# Patient Record
Sex: Female | Born: 1940 | Race: Black or African American | Hispanic: No | Smoking: Never smoker
Health system: Southern US, Community
[De-identification: ages and names within clinical notes are randomized; demographics above are authoritative.]

## PROBLEM LIST (undated history)

## (undated) DIAGNOSIS — M1990 Unspecified osteoarthritis, unspecified site: Secondary | ICD-10-CM

## (undated) DIAGNOSIS — M549 Dorsalgia, unspecified: Secondary | ICD-10-CM

## (undated) DIAGNOSIS — I10 Essential (primary) hypertension: Secondary | ICD-10-CM

## (undated) DIAGNOSIS — G8929 Other chronic pain: Secondary | ICD-10-CM

## (undated) DIAGNOSIS — F329 Major depressive disorder, single episode, unspecified: Secondary | ICD-10-CM

## (undated) DIAGNOSIS — Z8489 Family history of other specified conditions: Secondary | ICD-10-CM

## (undated) DIAGNOSIS — H409 Unspecified glaucoma: Secondary | ICD-10-CM

## (undated) DIAGNOSIS — Z8719 Personal history of other diseases of the digestive system: Secondary | ICD-10-CM

## (undated) DIAGNOSIS — J45909 Unspecified asthma, uncomplicated: Secondary | ICD-10-CM

## (undated) DIAGNOSIS — E119 Type 2 diabetes mellitus without complications: Secondary | ICD-10-CM

## (undated) DIAGNOSIS — K219 Gastro-esophageal reflux disease without esophagitis: Secondary | ICD-10-CM

## (undated) DIAGNOSIS — R519 Headache, unspecified: Secondary | ICD-10-CM

## (undated) DIAGNOSIS — D649 Anemia, unspecified: Secondary | ICD-10-CM

## (undated) DIAGNOSIS — R51 Headache: Secondary | ICD-10-CM

## (undated) DIAGNOSIS — C801 Malignant (primary) neoplasm, unspecified: Secondary | ICD-10-CM

## (undated) DIAGNOSIS — M810 Age-related osteoporosis without current pathological fracture: Secondary | ICD-10-CM

## (undated) DIAGNOSIS — R011 Cardiac murmur, unspecified: Secondary | ICD-10-CM

## (undated) DIAGNOSIS — N289 Disorder of kidney and ureter, unspecified: Secondary | ICD-10-CM

## (undated) DIAGNOSIS — G4710 Hypersomnia, unspecified: Secondary | ICD-10-CM

## (undated) DIAGNOSIS — G4730 Sleep apnea, unspecified: Secondary | ICD-10-CM

## (undated) DIAGNOSIS — N390 Urinary tract infection, site not specified: Secondary | ICD-10-CM

## (undated) DIAGNOSIS — K279 Peptic ulcer, site unspecified, unspecified as acute or chronic, without hemorrhage or perforation: Secondary | ICD-10-CM

## (undated) DIAGNOSIS — E7800 Pure hypercholesterolemia, unspecified: Secondary | ICD-10-CM

## (undated) DIAGNOSIS — F32A Depression, unspecified: Secondary | ICD-10-CM

## (undated) HISTORY — PX: JOINT REPLACEMENT: SHX530

## (undated) HISTORY — PX: TOTAL KNEE ARTHROPLASTY: SHX125

## (undated) HISTORY — PX: PACEMAKER IMPLANT: EP1218

## (undated) HISTORY — PX: CHOLECYSTECTOMY: SHX55

## (undated) HISTORY — PX: OTHER SURGICAL HISTORY: SHX169

## (undated) HISTORY — PX: KNEE ARTHROCENTESIS: SUR44

## (undated) HISTORY — PX: NEPHRECTOMY: SHX65

---

## 1998-02-01 ENCOUNTER — Ambulatory Visit (HOSPITAL_COMMUNITY): Admission: RE | Admit: 1998-02-01 | Discharge: 1998-02-01 | Payer: Self-pay | Admitting: *Deleted

## 1998-02-04 ENCOUNTER — Ambulatory Visit (HOSPITAL_COMMUNITY): Admission: RE | Admit: 1998-02-04 | Discharge: 1998-02-04 | Payer: Self-pay | Admitting: *Deleted

## 1999-07-26 ENCOUNTER — Encounter: Admission: RE | Admit: 1999-07-26 | Discharge: 1999-07-26 | Payer: Self-pay | Admitting: Urology

## 1999-07-26 ENCOUNTER — Encounter: Payer: Self-pay | Admitting: Urology

## 2001-01-13 ENCOUNTER — Emergency Department (HOSPITAL_COMMUNITY): Admission: EM | Admit: 2001-01-13 | Discharge: 2001-01-13 | Payer: Self-pay | Admitting: Emergency Medicine

## 2001-01-13 ENCOUNTER — Encounter: Payer: Self-pay | Admitting: Emergency Medicine

## 2004-02-08 ENCOUNTER — Ambulatory Visit (HOSPITAL_COMMUNITY): Admission: RE | Admit: 2004-02-08 | Discharge: 2004-02-08 | Payer: Self-pay | Admitting: Urology

## 2004-02-22 ENCOUNTER — Ambulatory Visit (HOSPITAL_COMMUNITY): Admission: RE | Admit: 2004-02-22 | Discharge: 2004-02-22 | Payer: Self-pay | Admitting: Urology

## 2004-02-22 ENCOUNTER — Ambulatory Visit (HOSPITAL_BASED_OUTPATIENT_CLINIC_OR_DEPARTMENT_OTHER): Admission: RE | Admit: 2004-02-22 | Discharge: 2004-02-22 | Payer: Self-pay | Admitting: Urology

## 2004-04-10 ENCOUNTER — Encounter: Admission: RE | Admit: 2004-04-10 | Discharge: 2004-04-10 | Payer: Self-pay | Admitting: General Surgery

## 2004-05-25 ENCOUNTER — Encounter (INDEPENDENT_AMBULATORY_CARE_PROVIDER_SITE_OTHER): Payer: Self-pay | Admitting: *Deleted

## 2004-05-25 ENCOUNTER — Inpatient Hospital Stay (HOSPITAL_COMMUNITY): Admission: RE | Admit: 2004-05-25 | Discharge: 2004-05-28 | Payer: Self-pay | Admitting: General Surgery

## 2007-07-02 ENCOUNTER — Ambulatory Visit (HOSPITAL_COMMUNITY): Admission: RE | Admit: 2007-07-02 | Discharge: 2007-07-02 | Payer: Self-pay | Admitting: Urology

## 2009-04-02 ENCOUNTER — Ambulatory Visit: Payer: Self-pay | Admitting: Diagnostic Radiology

## 2009-04-02 ENCOUNTER — Emergency Department (HOSPITAL_BASED_OUTPATIENT_CLINIC_OR_DEPARTMENT_OTHER): Admission: EM | Admit: 2009-04-02 | Discharge: 2009-04-02 | Payer: Self-pay | Admitting: Emergency Medicine

## 2009-04-25 ENCOUNTER — Emergency Department (HOSPITAL_BASED_OUTPATIENT_CLINIC_OR_DEPARTMENT_OTHER): Admission: EM | Admit: 2009-04-25 | Discharge: 2009-04-26 | Payer: Self-pay | Admitting: Emergency Medicine

## 2009-04-26 ENCOUNTER — Ambulatory Visit: Payer: Self-pay | Admitting: Diagnostic Radiology

## 2009-11-01 ENCOUNTER — Ambulatory Visit (HOSPITAL_COMMUNITY)
Admission: RE | Admit: 2009-11-01 | Discharge: 2009-11-01 | Payer: Self-pay | Source: Home / Self Care | Admitting: Urology

## 2010-03-07 IMAGING — CR DG KNEE COMPLETE 4+V*R*
4 series · 4 of 4 positions shown · non-contrast
Comparison: None.

CLINICAL DATA: 68-year-old female status post fall with knee pain.

RIGHT KNEE - COMPLETE 4+ VIEW

[t knee ap right]
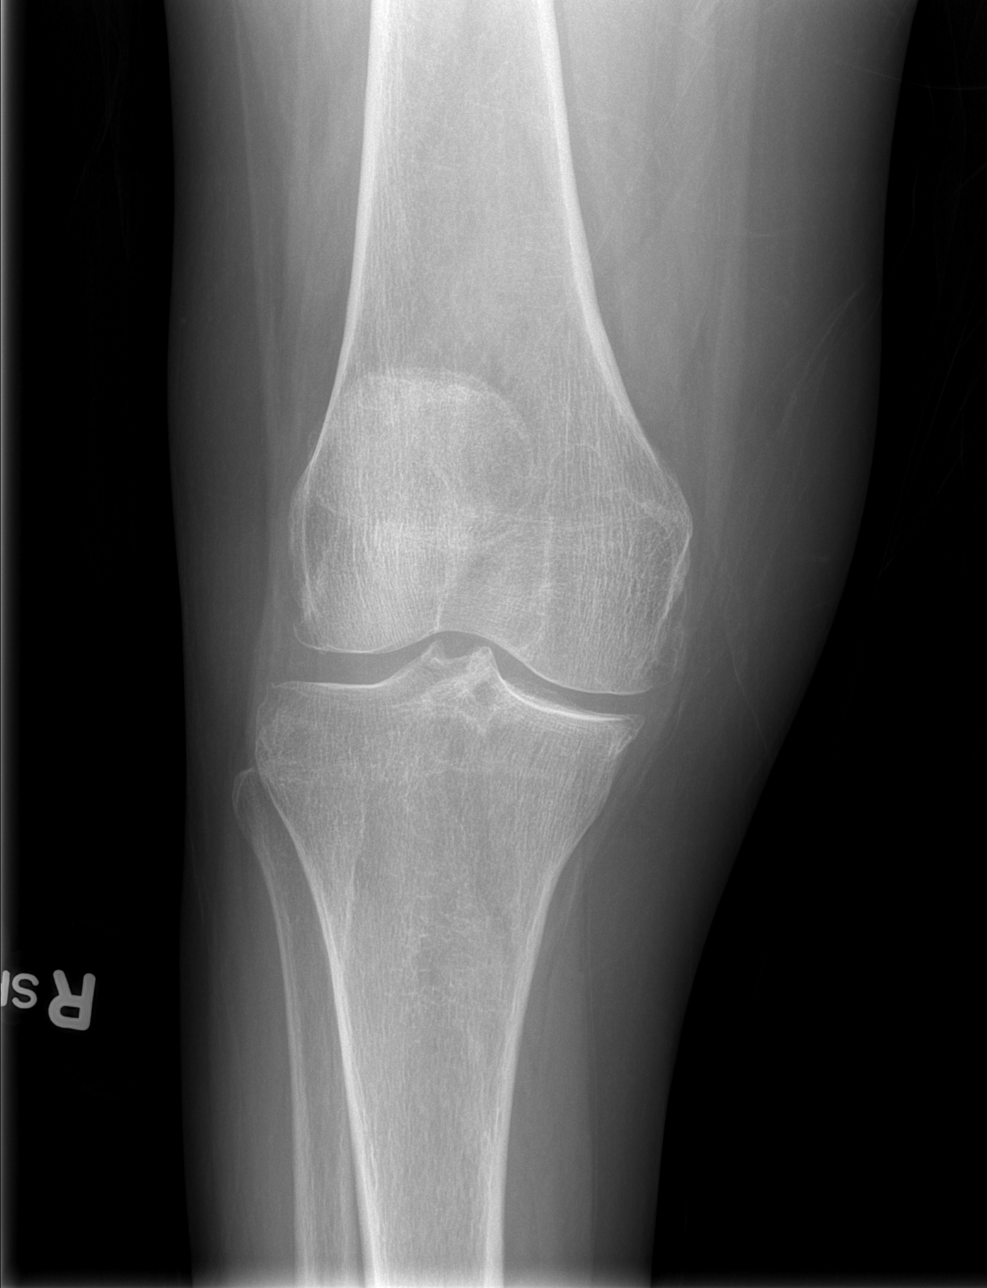

[t knee oblique right (1 of 2)]
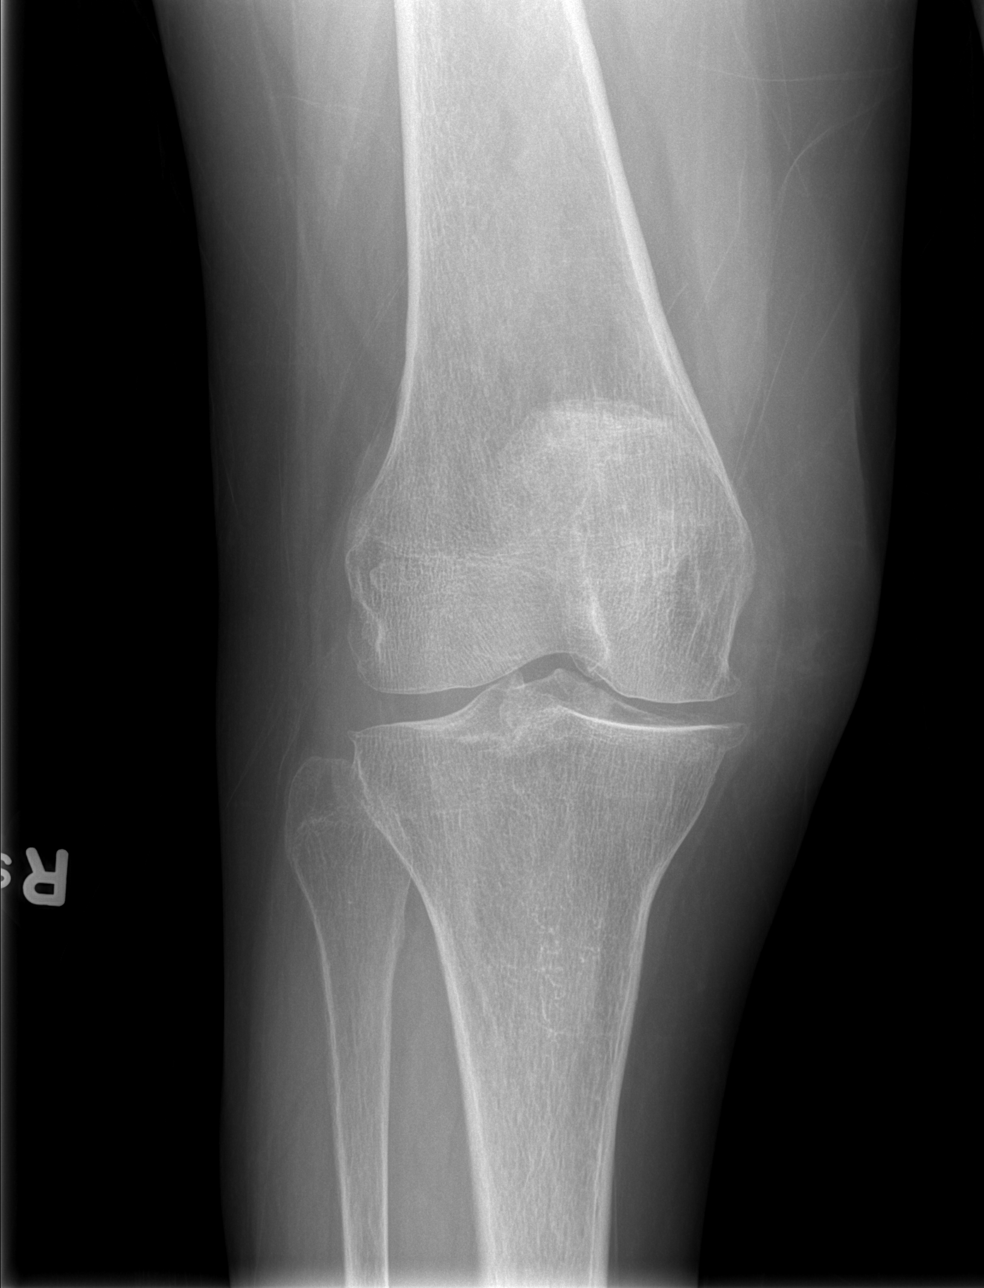

[t knee oblique right (2 of 2)]
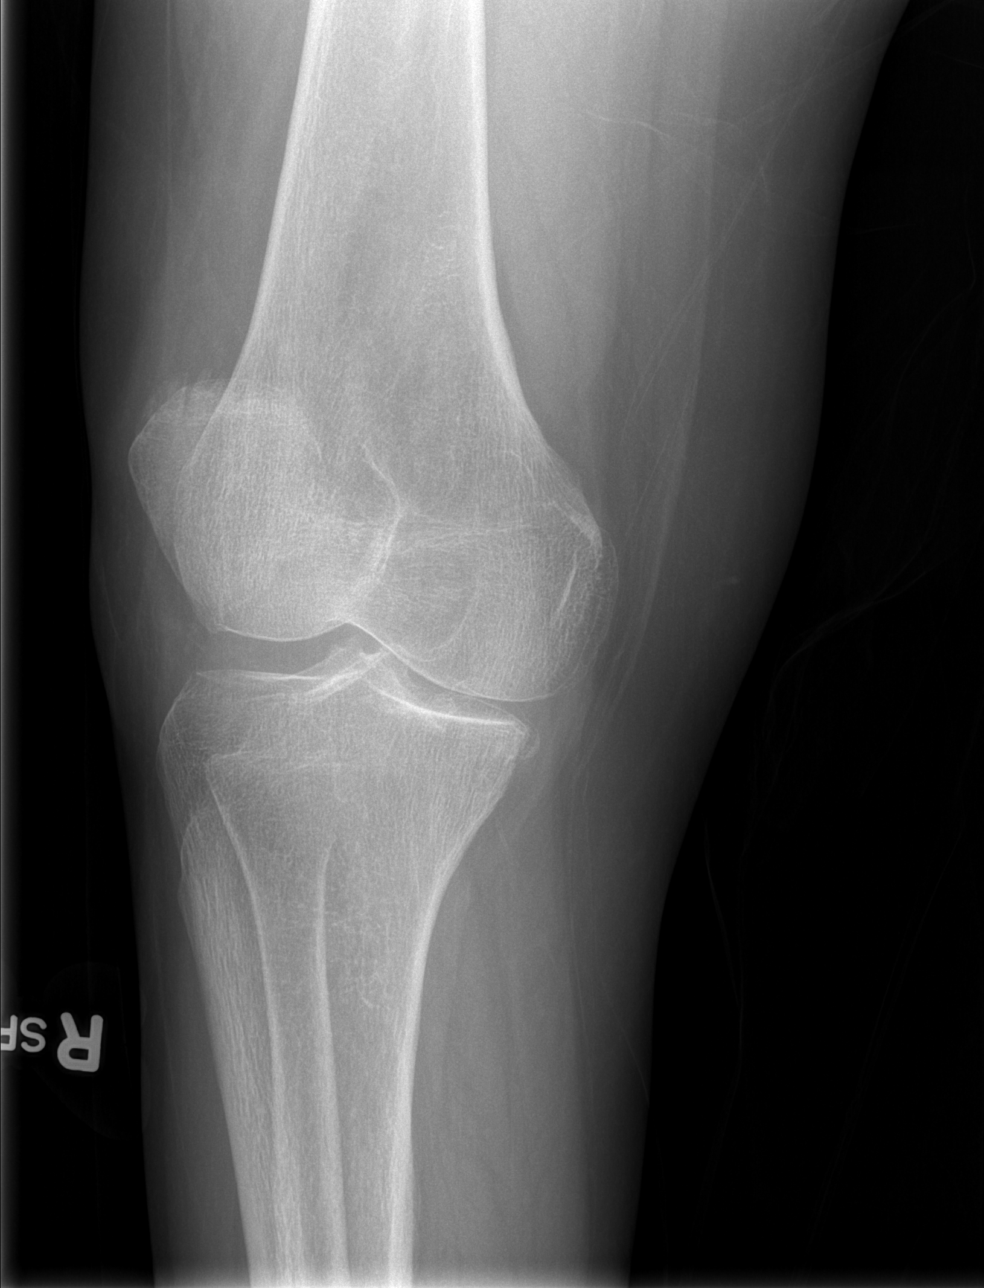

[t knee lat right]
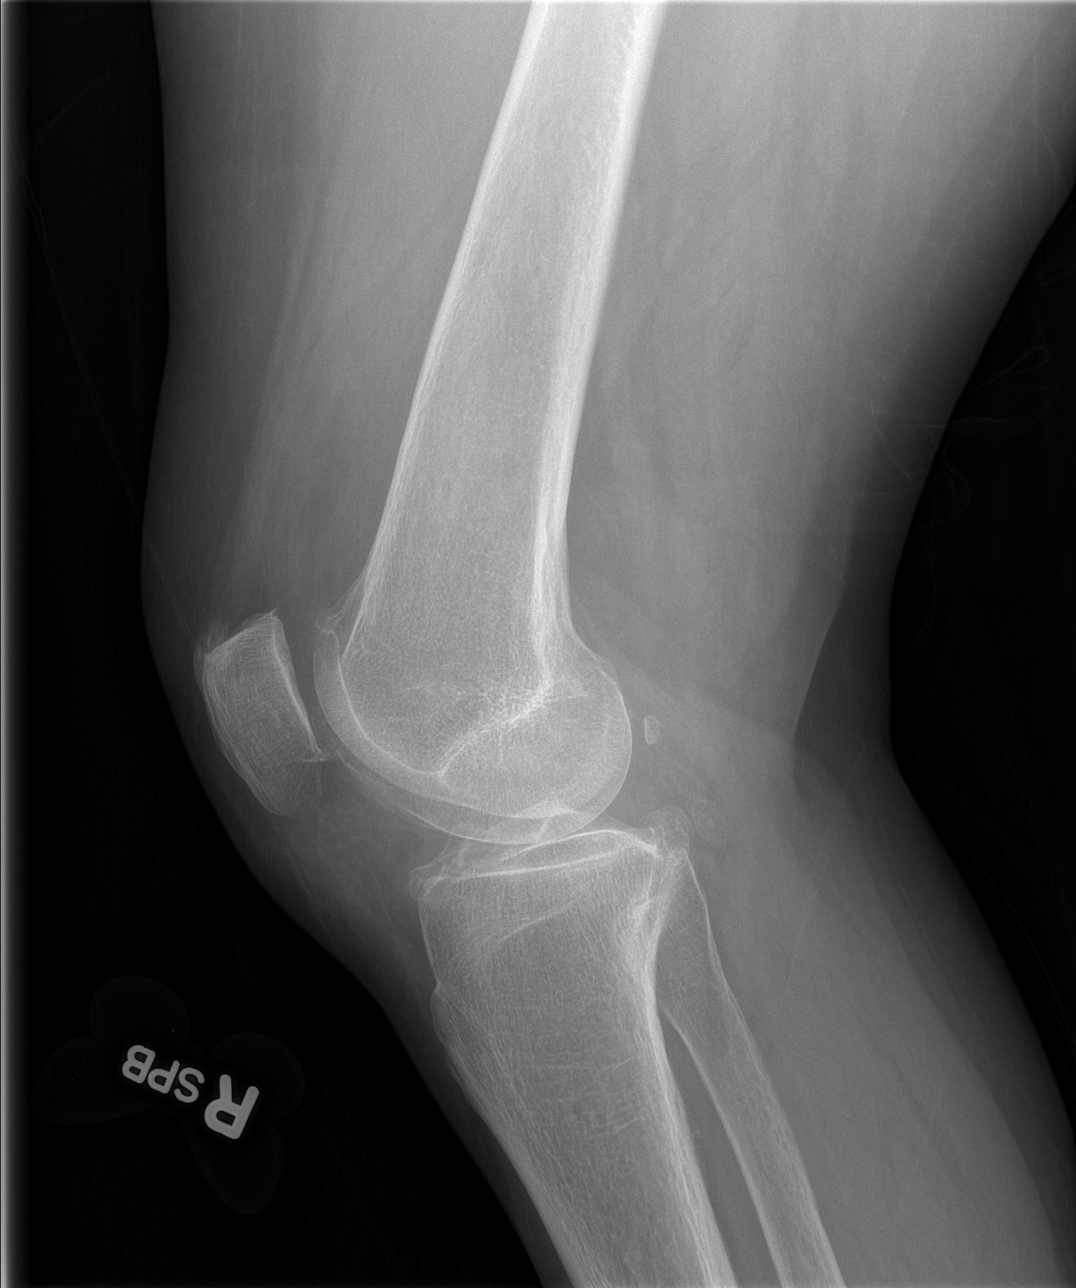

[4 of 4 positions shown; findings below may reference images not displayed]

FINDINGS: Osteopenia.  Degenerative spurring medial compartment.
Possible small joint effusion.  There is stranding in Hoffa's fat
pad.  Corticated fragment posterior to the knee.  No acute fracture
identified.
IMPRESSION: Possible small joint effusion and stranding in Hoffa's fat pad may
indicate internal derangement. No definite acute fracture or
dislocation identified about the right knee.

## 2010-03-07 IMAGING — CR DG FINGER LITTLE 2+V*L*
3 series · 3 of 3 positions shown · non-contrast
Comparison: 4466 hours the same day.

CLINICAL DATA: 68-year-old female status post reduction of left
fifth digit fracture.

LEFT LITTLE FINGER 2+V

[x finger pa left *]
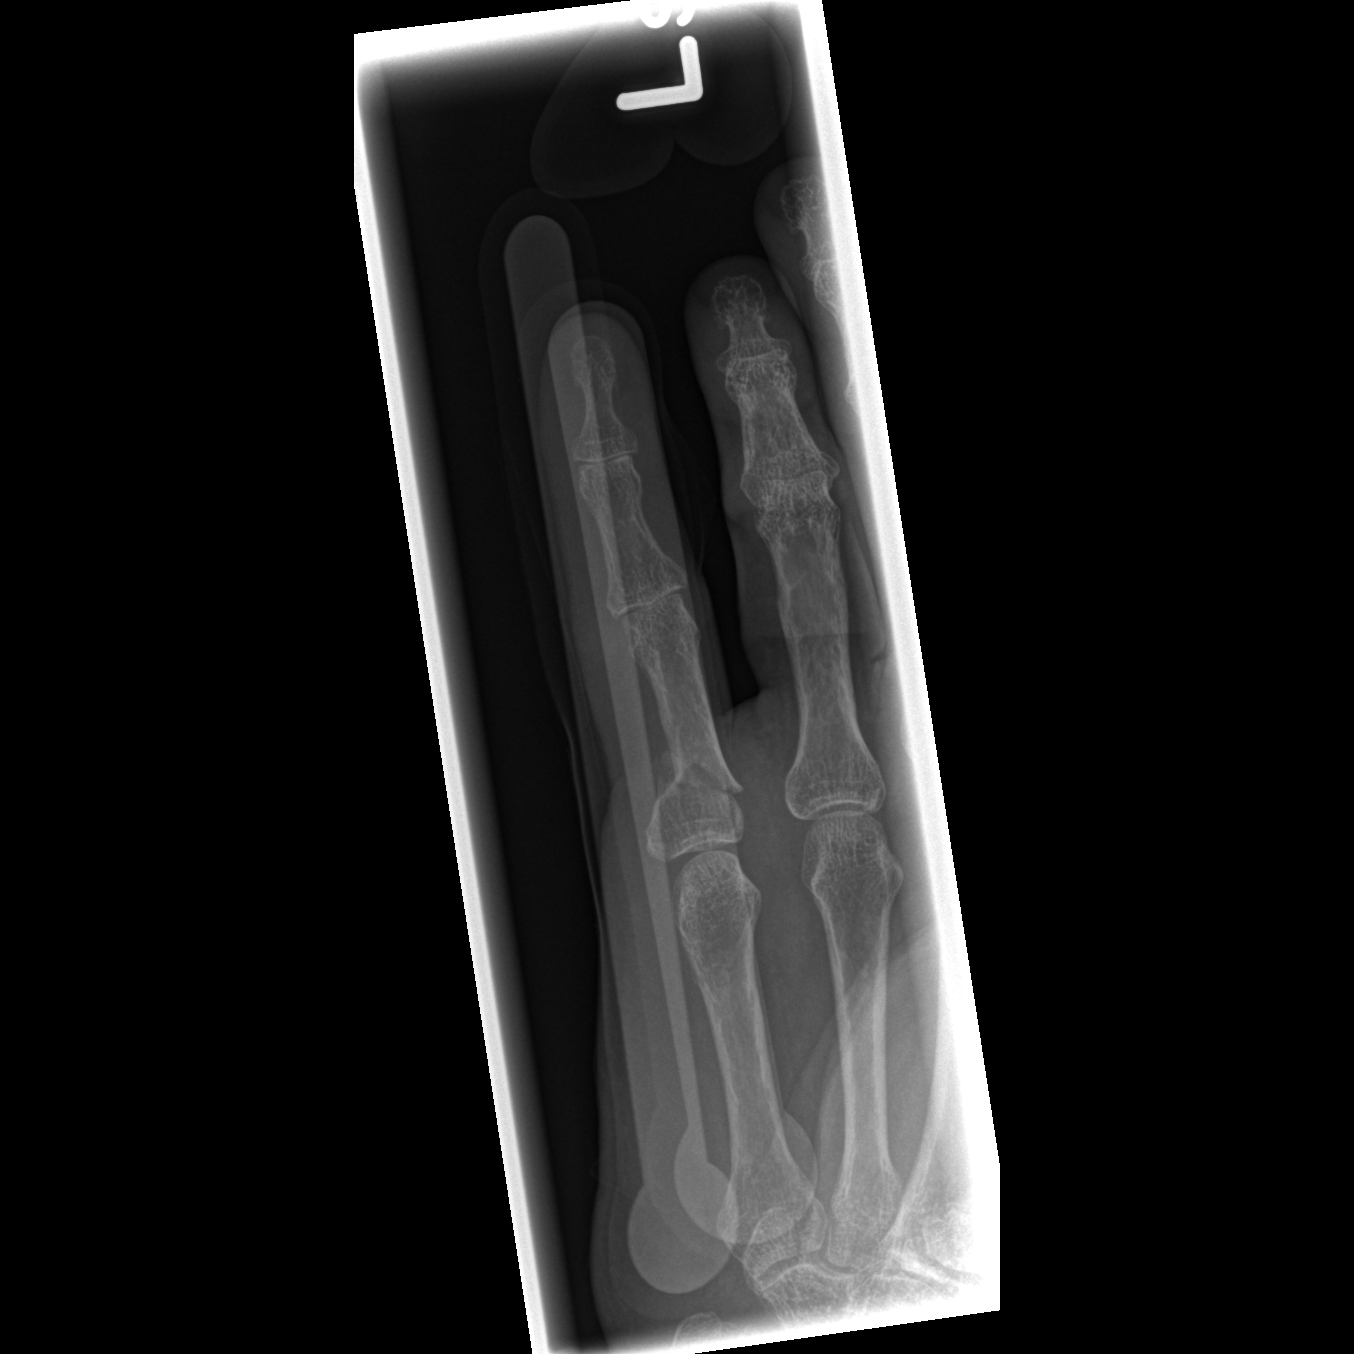

[x finger obl. left *]
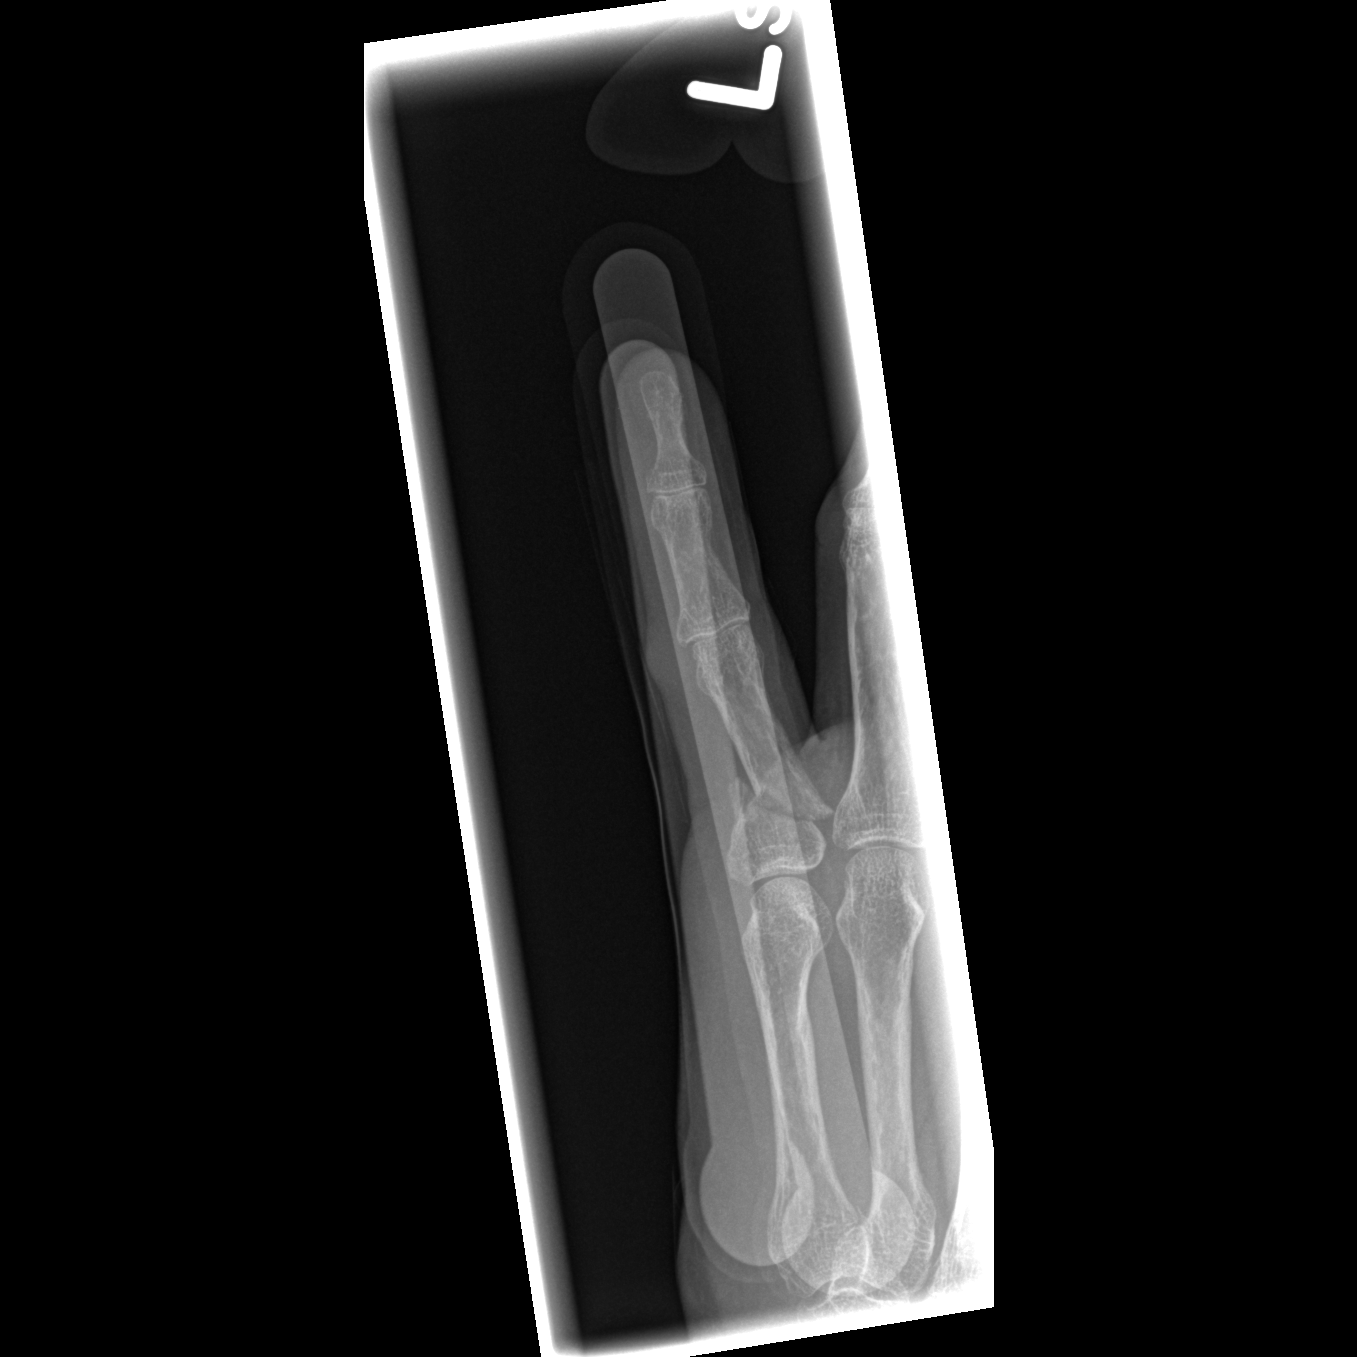

[x finger lateral left *]
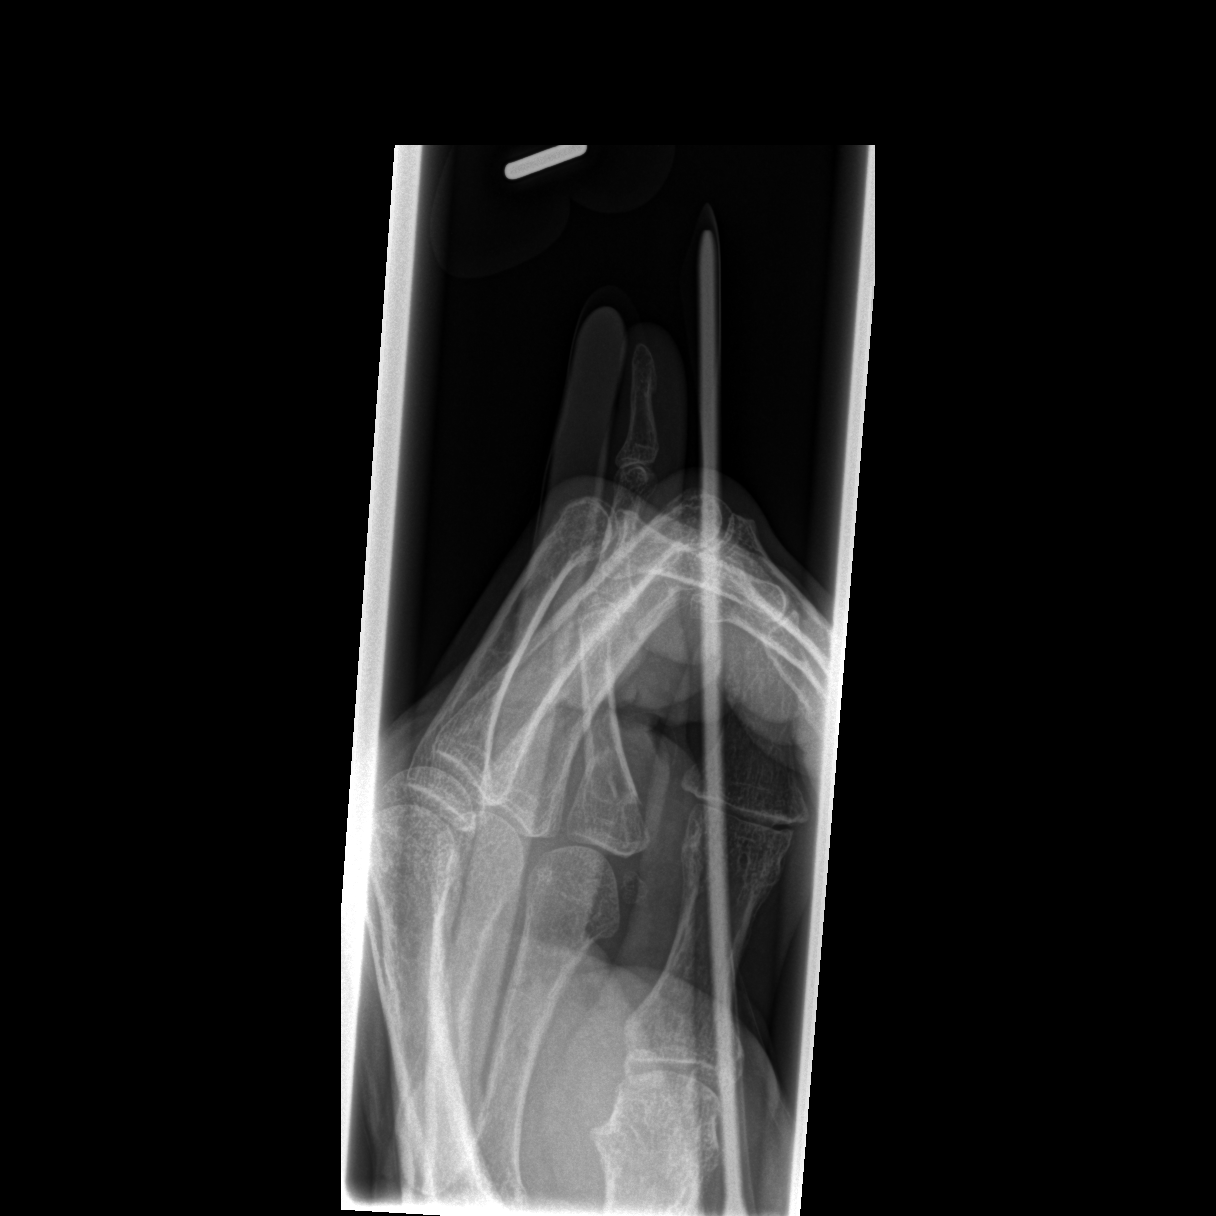

[3 of 3 positions shown; findings below may reference images not displayed]

FINDINGS: Splint material in place about the left fifth ray.
Improved alignment about the comminuted left proximal phalanx base
fracture.  Mild residual radial displacement and ulnar angulation.
Residual dorsal angulation.
IMPRESSION: Splint material placed about the left fifth ray with some interval
improved alignment of the distal fragment.

## 2010-03-07 IMAGING — CR DG HAND COMPLETE 3+V*L*
3 series · 3 of 3 positions shown · non-contrast
Comparison: None.

CLINICAL DATA: 68-year-old female status post fall with pain.

LEFT HAND - COMPLETE 3+ VIEW

[x hand pa left]
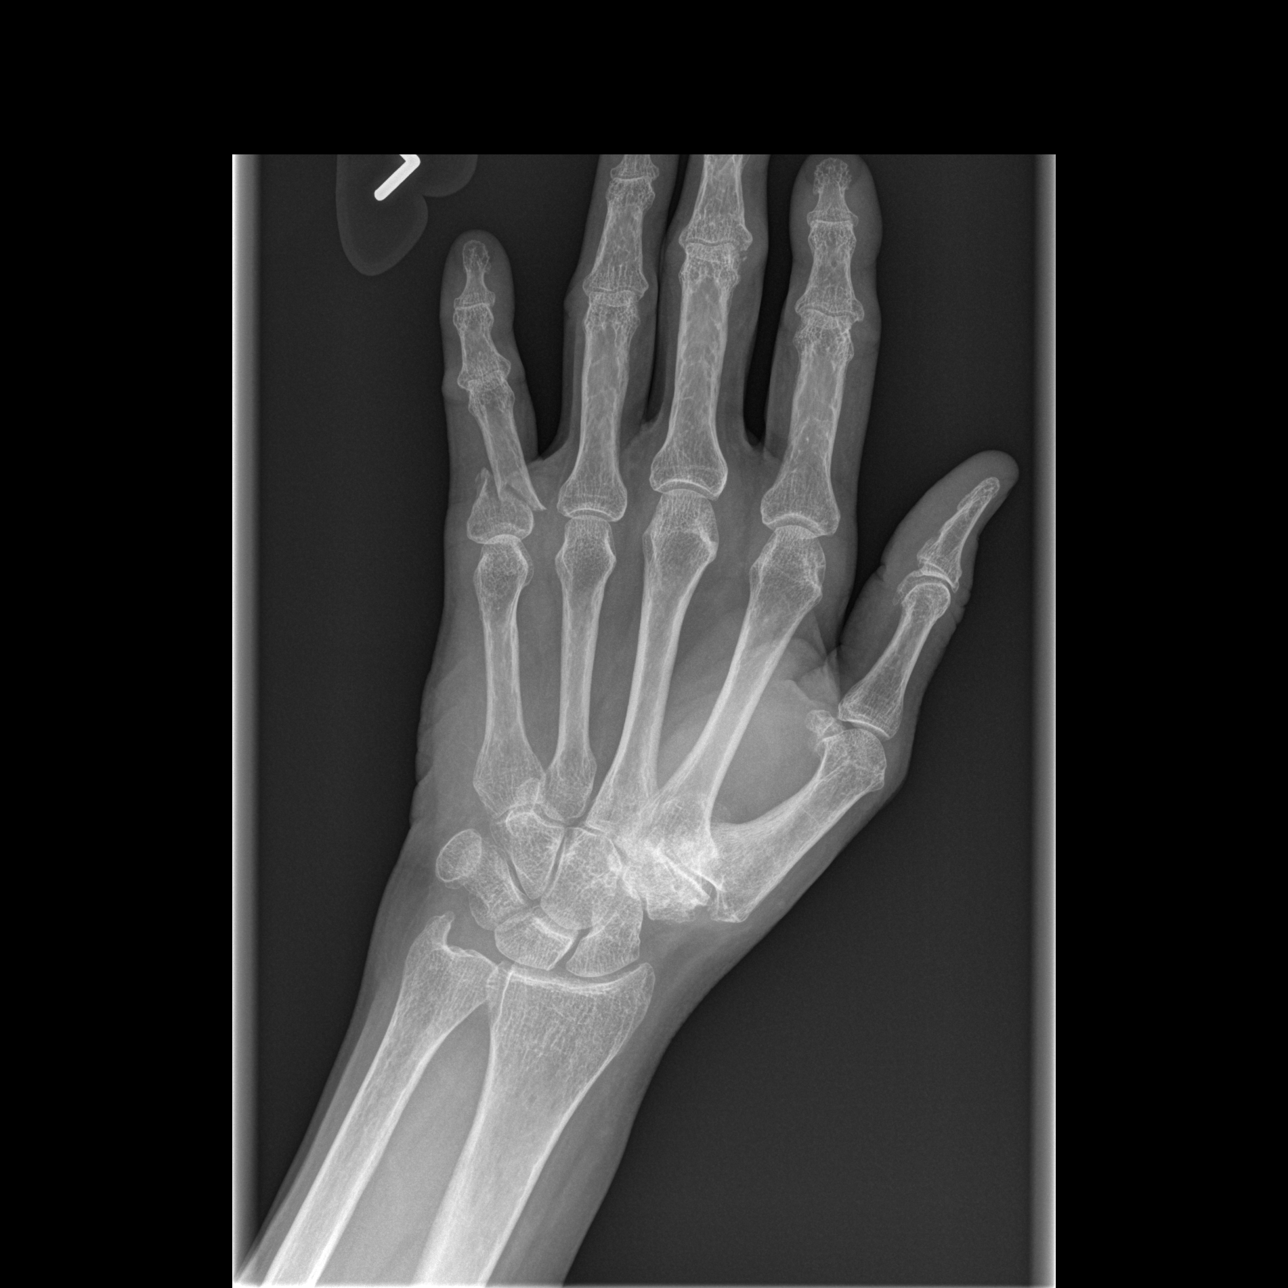

[x hand oblique left]
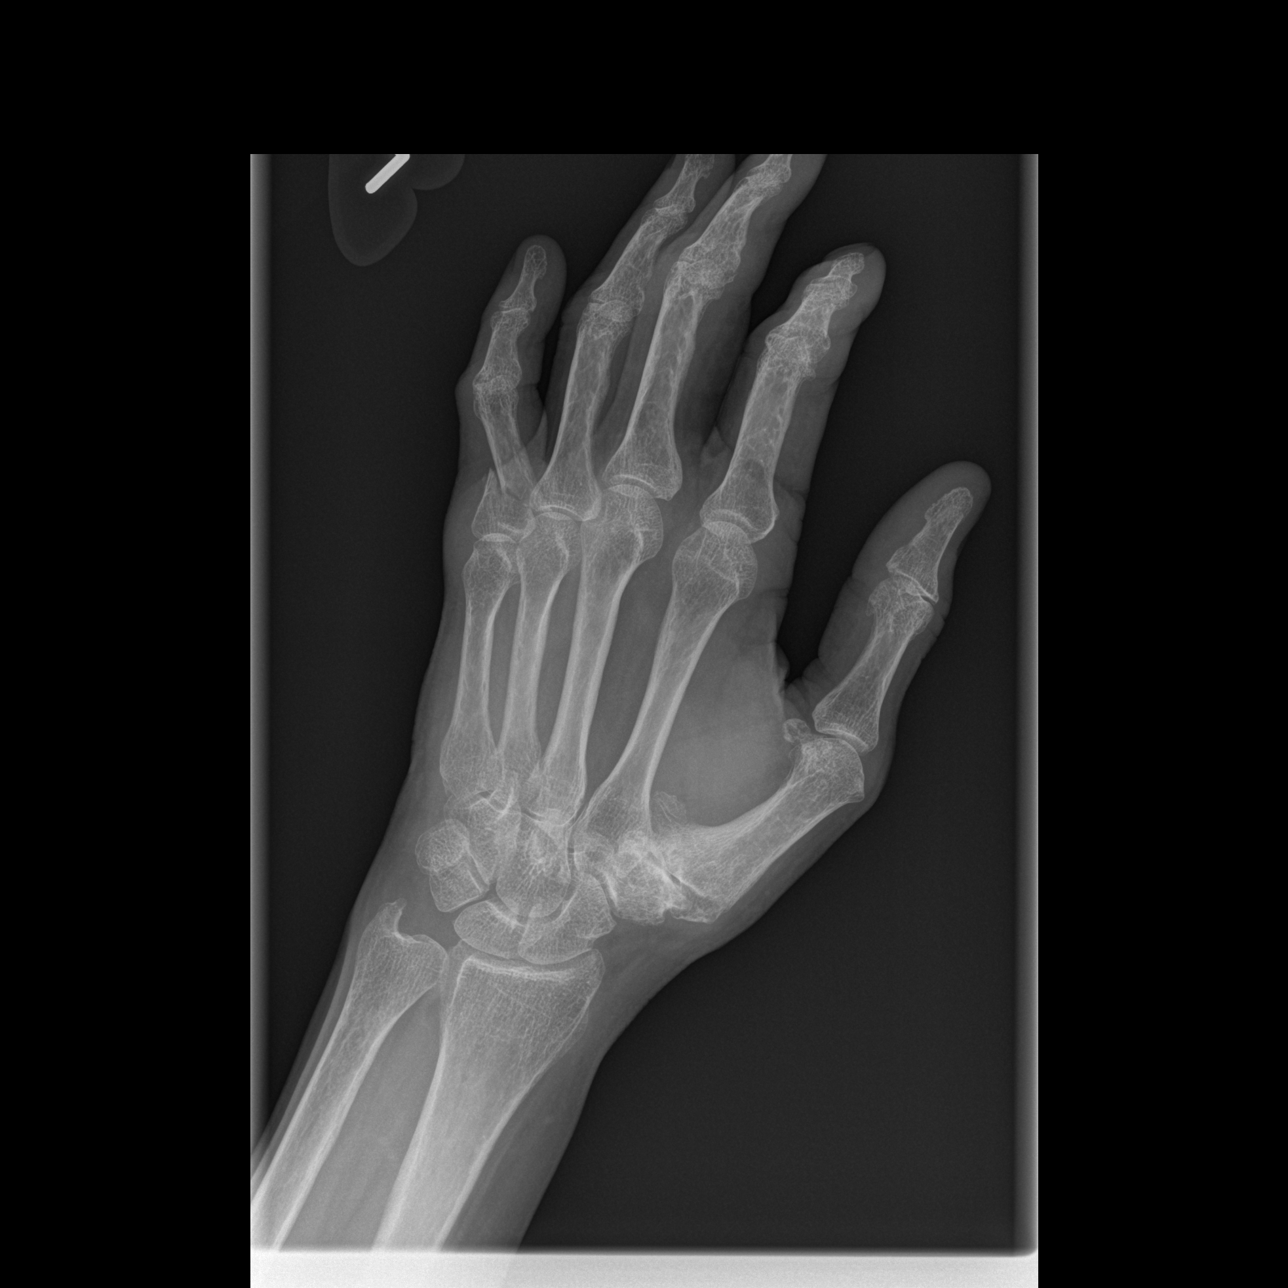

[x hand lat left]
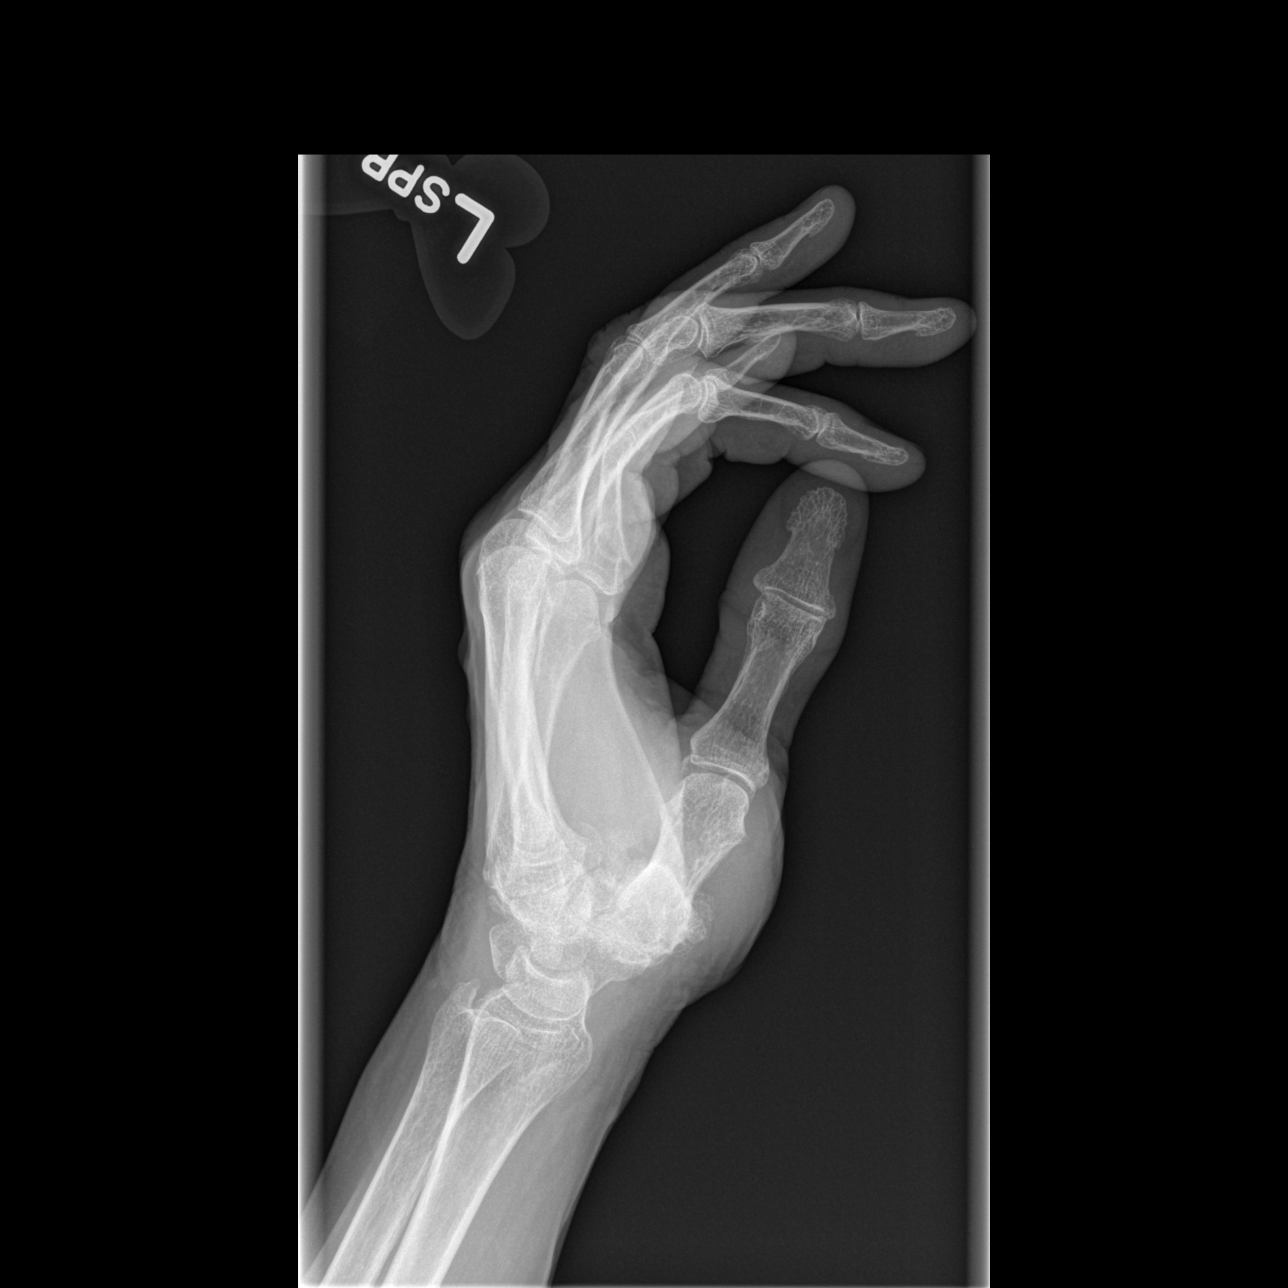

[3 of 3 positions shown; findings below may reference images not displayed]

FINDINGS: Mildly comminuted spiral fracture proximal left fifth
proximal phalanx.  No intra-articular extension.  Overriding of
fragments and ulnar and dorsal angulation of the distal fragment.

Osteopenia elsewhere.  Degenerative changes at the first and second
carpometacarpal joint.  No other acute fracture identified.
IMPRESSION: Mildly comminuted fracture proximal left fifth proximal phalanx
with ulnar and dorsal angulation.  No intra-articular extension.

## 2010-11-03 LAB — URINE MICROSCOPIC-ADD ON

## 2010-11-03 LAB — BASIC METABOLIC PANEL
Chloride: 102 mEq/L (ref 96–112)
Creatinine, Ser: 1 mg/dL (ref 0.4–1.2)
GFR calc non Af Amer: 55 mL/min — ABNORMAL LOW (ref >60)
Glucose, Bld: 88 mg/dL (ref 70–99)
Potassium: 4.8 mEq/L (ref 3.5–5.1)

## 2010-11-03 LAB — DIFFERENTIAL
Basophils Absolute: 0 10*3/uL (ref 0.0–0.1)
Basophils Relative: 1 % (ref 0–1)
Eosinophils Absolute: 0 10*3/uL (ref 0.0–0.7)
Eosinophils Relative: 1 % (ref 0–5)
Lymphocytes Relative: 35 % (ref 12–46)
Monocytes Absolute: 0.3 10*3/uL (ref 0.1–1.0)
Neutro Abs: 2.2 10*3/uL (ref 1.7–7.7)

## 2010-11-03 LAB — URINE CULTURE

## 2010-11-03 LAB — URINALYSIS, ROUTINE W REFLEX MICROSCOPIC
Ketones, ur: 15 mg/dL — AB
Nitrite: NEGATIVE
Protein, ur: NEGATIVE mg/dL

## 2010-11-03 LAB — CBC: MCHC: 34.6 g/dL (ref 30.0–36.0)

## 2010-12-15 NOTE — Discharge Summary (Signed)
[name], [name]                ACCOUNT NO.:  [id number]   MEDICAL RECORD NO.:  [id number]          PATIENT TYPE:  INP   LOCATION:  5740                         FACILITY:  [facility]   PHYSICIAN:  Leonie Man, M.D.   DATE OF BIRTH:  [birth year]   DATE OF ADMISSION:  05/25/2004  DATE OF DISCHARGE:  05/28/2004                                 DISCHARGE SUMMARY   ADMISSION DIAGNOSES:  1.  Abdominal wall mass/incarcerated ventral hernia.  2.  Hypertension.  3.  Asthma.  4.  Degenerative joint disease.   DISCHARGE DIAGNOSES:  1.  Abdominal wall mass/incarcerated ventral hernia.  2.  Hypertension.  3.  Asthma.  4.  Degenerative joint disease.   PROCEDURE:  Repair of incarcerated ventral hernia and segmented colonic  resection.  Pathology showing transmural ischemia of segmental colon.  Postoperative complications none.   CONDITION ON DISCHARGE:  Improved.   HOSPITAL COURSE:  Ms. Cathy Richardson is a 70 year old woman presenting with  intermittently painful abdominal wall mass in the paraumbilical region.  She  had a CT scan which showed this to be an incarcerated hernia containing  small bowel.  She was brought to surgery for exploration of this area and  repair of her hernia.  On exploration, she was noted to have an incarcerated  segment of ischemic colon with a contained perforation.  She underwent  resection of this portion of her colon with primary anastomosis.  The hernia  was repaired without the use of mesh because of the bowel resection.  Her  postoperative course has been benign.  On the day of discharge, she is  tolerating her usual diet and having normal bowel function.  She is now  being discharged to be followed up in the office for one week for suture  removal.   DISCHARGE MEDICATIONS:  Percocet one to two every 4 hours p.r.n. pain.   ACTIVITY:  No heavy lifting for the next 4-6 weeks.      Patr   PB/MEDQ  D:  05/28/2004  T:  05/28/2004  Job:  [id number]

## 2010-12-15 NOTE — Op Note (Signed)
NAMETASHE, PURDON                ACCOUNT NO.:  [id number]   MEDICAL RECORD NO.:  [id number]          PATIENT TYPE:  OIB   LOCATION:  2550                         FACILITY:  [facility]   PHYSICIAN:  Leonie Man, M.D.   DATE OF BIRTH:  08/05/1940   DATE OF PROCEDURE:  05/25/2004  DATE OF DISCHARGE:                                 OPERATIVE REPORT   PREOPERATIVE DIAGNOSIS:  Incarcerated ventral hernia with small bowel.   POSTOPERATIVE DIAGNOSIS:  Incarcerated ventral hernia containing colon and  two fecal bezoar.   PROCEDURES:  1.  Segmental colectomy.  2.  Repair of ventral hernia.   SURGEON:  Leonie Man, M.D.   ASSISTANT:  Nurse.   ANESTHESIA:  General.   NOTE:  Ms. Freitas is a 70 year old lady presenting with a tender mass in the  left paraumbilical region.  This has not caused any significant pain and she  has not been complaining of any GI symptoms.  She had not noted any  significant increase in the size of the mass since it was discovered.  Investigation of the mass with a CT scan shows that the patient does have a  hernia in this area, which appears to have small bowel entering into it and  that the small bowel is not distended proximal.  The patient comes to the  operating room for exploration of the paraumbilical mass and repair of her  ventral hernia.  The risks and potential benefits of surgery have been fully  discussed, all questions answered and consent obtained.   DESCRIPTION OF PROCEDURE:  Following the induction of satisfactory general  anesthesia with the patient positioned supinely, the abdomen was prepped and  draped to be included in a sterile operative field.  A midline incision was  carried down through the whole scar cicatrix, deepening this down to the  fascia.  Dissection is carried subcutaneously to the left paraumbilical  region to the region of the mass.  The mass is dissected out upon and noted  to have a large hard mass around it.  On further  dissection, this was noted  to be something incarcerated within the hernia sac.  Upon opening the hernia  sac, there was a portion of incarcerated colon within the hernia sac.  The  colon was thinned and there was a small hole within the colon.  This area  was walled off completely and the incision was extended superiorly and  inferiorly.  The portion of incarcerated bowel was then fully mobilized and  brought up into the wound.  Adhesiolysis had to be carried out so as to  accomplish this.  The colon having been fully mobilized, then with the use  of the GIA stapling device the colon was transected proximally and distally.  A functional end-to-end anastomosis was then carried out using the GIA  stapler and the TA60 stapling devices.  The resulting anastomosis was noted  to be widely patent.  A 3-0 Vicryl suture was placed in the crotch for  further security off of the anastomosis.  The area was then thoroughly  irrigated with multiple  aliquots of saline and the bowel returned to the  peritoneal cavity.  Because of the prior bowel resection, we decided not to  use any mesh for repair of the hernia, so the hernia was repaired primarily  using a running suture of #1 Novafil interspersed with interrupted sutures  of #1 Novafil.  The repair was noted to be intact.  The subcutaneous tissues  were then thoroughly irrigated with multiple  aliquots of saline and the subcutaneous tissues closed with interrupted 2-0  Vicryl sutures and the skin closed with staples.  Sterile dressings applied,  the anesthetic reversed and the patient removed from the operating room to  the recovery room in stable condition.  She tolerated the procedure well.      Patr   PB/MEDQ  D:  05/25/2004  T:  05/25/2004  Job:  [id number]

## 2010-12-15 NOTE — Op Note (Signed)
Cathy Richardson, Cathy Richardson                ACCOUNT NO.:  [id number]   MEDICAL RECORD NO.:  [id number]          PATIENT TYPE:  OIB   LOCATION:  2550                         FACILITY:  [facility]   PHYSICIAN:  Leonie Man, M.D.   DATE OF BIRTH:  [birth year]   DATE OF PROCEDURE:  05/25/2004  DATE OF DISCHARGE:                                 OPERATIVE REPORT   PREOPERATIVE DIAGNOSIS:  Right paraumbilical mass, probable incarcerated  ventral hernia.   POSTOPERATIVE DIAGNOSIS:  Right paraumbilical mass, probable incarcerated  ventral hernia.   PROCEDURES:   Dictation ended at this point.       PB/MEDQ  D:  05/25/2004  T:  05/25/2004  Job:  [id number]

## 2010-12-15 NOTE — Op Note (Signed)
NAME:  Cathy Richardson, Cathy Richardson                          ACCOUNT NO.:  [id number]   MEDICAL RECORD NO.:  [id number]                   PATIENT TYPE:  AMB   LOCATION:  [facility]                                 FACILITY:  [facility]   PHYSICIAN:  Lindaann Slough, M.D.               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   PROCEDURE:  Cystoscopy and cystogram.   SURGEON:  Lindaann Slough, M.D.   ANESTHESIA:  General.   INDICATIONS FOR PROCEDURE:  The patient is a 70 year old female who has been  complaining of frequency, urgency, suprapubic discomfort and dysuria.  She  was treated with antibiotics and Pyridium without any improvement. She was  scheduled for a cystogram; however, she could not tolerate the insertion of  the catheter. She is scheduled for cystoscopy and cystogram under  anesthesia.   Under general anesthesia, the patient was prepped and draped and placed in  the dorsal lithotomy position. A #22 Wappler cystoscope was inserted in the  bladder.  The bladder mucosa is normal. There is no stone or tumor in the  bladder. The ureteral orifices are in normal position. There is no efflux  from the left ureteral orifice. The patient is status post left nephrectomy  for renal cell carcinoma about 20 years ago.  The efflux from the right  ureteral orifice is clear.  There is no evidence of glomerulations.  Then  the cystoscope was removed. A #16 French red Robinson catheter was inserted  in the bladder and contrast was injected through the catheter into the  bladder. There is no evidence of reflux, no evidence of urethral  diverticulum.  Then the urethra was dilated with #30 [place].   The patient tolerated the procedure well and left the OR in satisfactory  condition to post anesthesia care  unit.                                               Lindaann Slough, M.D.    MN/MEDQ  D:  02/22/2004  T:  02/22/2004  Job:  [id number]

## 2012-02-17 DIAGNOSIS — W108XXA Fall (on) (from) other stairs and steps, initial encounter: Secondary | ICD-10-CM | POA: Insufficient documentation

## 2012-02-17 DIAGNOSIS — Z23 Encounter for immunization: Secondary | ICD-10-CM | POA: Insufficient documentation

## 2012-02-17 DIAGNOSIS — M79609 Pain in unspecified limb: Secondary | ICD-10-CM | POA: Insufficient documentation

## 2012-02-17 DIAGNOSIS — S01501A Unspecified open wound of lip, initial encounter: Secondary | ICD-10-CM | POA: Insufficient documentation

## 2012-02-17 DIAGNOSIS — S0003XA Contusion of scalp, initial encounter: Secondary | ICD-10-CM | POA: Insufficient documentation

## 2012-02-17 DIAGNOSIS — S1093XA Contusion of unspecified part of neck, initial encounter: Secondary | ICD-10-CM | POA: Insufficient documentation

## 2012-02-18 ENCOUNTER — Other Ambulatory Visit (HOSPITAL_BASED_OUTPATIENT_CLINIC_OR_DEPARTMENT_OTHER): Payer: Self-pay | Admitting: Emergency Medicine

## 2012-02-18 ENCOUNTER — Ambulatory Visit (HOSPITAL_BASED_OUTPATIENT_CLINIC_OR_DEPARTMENT_OTHER)
Admission: RE | Admit: 2012-02-18 | Discharge: 2012-02-18 | Disposition: A | Discharge status: Home / Self Care | Payer: Medicare Other | Source: Ambulatory Visit | Attending: Emergency Medicine | Admitting: Emergency Medicine

## 2012-02-18 ENCOUNTER — Emergency Department (HOSPITAL_BASED_OUTPATIENT_CLINIC_OR_DEPARTMENT_OTHER)
Admission: EM | Admit: 2012-02-18 | Discharge: 2012-02-18 | Disposition: A | Discharge status: Home / Self Care | Payer: Medicare Other | Attending: Emergency Medicine | Admitting: Emergency Medicine

## 2012-02-18 DIAGNOSIS — W19XXXA Unspecified fall, initial encounter: Secondary | ICD-10-CM | POA: Insufficient documentation

## 2012-02-18 DIAGNOSIS — S0003XA Contusion of scalp, initial encounter: Secondary | ICD-10-CM | POA: Insufficient documentation

## 2012-02-18 DIAGNOSIS — R52 Pain, unspecified: Secondary | ICD-10-CM

## 2012-02-18 DIAGNOSIS — S1093XA Contusion of unspecified part of neck, initial encounter: Secondary | ICD-10-CM | POA: Insufficient documentation

## 2012-02-18 MED FILL — Tet Tox-Diph-Acell Pertuss Ad Inj 5-2.5-18.5 LF-LF-MCG/0.5ML: INTRAMUSCULAR | Qty: 0.5 | Status: AC

## 2013-05-23 ENCOUNTER — Encounter (HOSPITAL_BASED_OUTPATIENT_CLINIC_OR_DEPARTMENT_OTHER): Payer: Self-pay | Admitting: Emergency Medicine

## 2013-05-23 ENCOUNTER — Emergency Department (HOSPITAL_BASED_OUTPATIENT_CLINIC_OR_DEPARTMENT_OTHER): Payer: Medicare Other

## 2013-05-23 ENCOUNTER — Emergency Department (HOSPITAL_BASED_OUTPATIENT_CLINIC_OR_DEPARTMENT_OTHER)
Admission: EM | Admit: 2013-05-23 | Discharge: 2013-05-24 | Disposition: A | Discharge status: Home / Self Care | Payer: Medicare Other | Attending: Emergency Medicine | Admitting: Emergency Medicine

## 2013-05-23 DIAGNOSIS — Z87448 Personal history of other diseases of urinary system: Secondary | ICD-10-CM | POA: Insufficient documentation

## 2013-05-23 DIAGNOSIS — E119 Type 2 diabetes mellitus without complications: Secondary | ICD-10-CM | POA: Insufficient documentation

## 2013-05-23 DIAGNOSIS — I10 Essential (primary) hypertension: Secondary | ICD-10-CM | POA: Insufficient documentation

## 2013-05-23 DIAGNOSIS — Z79899 Other long term (current) drug therapy: Secondary | ICD-10-CM | POA: Insufficient documentation

## 2013-05-23 DIAGNOSIS — M545 Low back pain, unspecified: Secondary | ICD-10-CM | POA: Insufficient documentation

## 2013-05-23 DIAGNOSIS — M549 Dorsalgia, unspecified: Secondary | ICD-10-CM

## 2013-05-23 DIAGNOSIS — G8929 Other chronic pain: Secondary | ICD-10-CM | POA: Insufficient documentation

## 2013-05-23 DIAGNOSIS — R1032 Left lower quadrant pain: Secondary | ICD-10-CM | POA: Insufficient documentation

## 2013-05-23 DIAGNOSIS — E7800 Pure hypercholesterolemia, unspecified: Secondary | ICD-10-CM | POA: Insufficient documentation

## 2013-05-23 HISTORY — DX: Type 2 diabetes mellitus without complications: E11.9

## 2013-05-23 HISTORY — DX: Pure hypercholesterolemia, unspecified: E78.00

## 2013-05-23 HISTORY — DX: Sleep apnea, unspecified: G47.30

## 2013-05-23 HISTORY — DX: Disorder of kidney and ureter, unspecified: N28.9

## 2013-05-23 HISTORY — DX: Essential (primary) hypertension: I10

## 2013-05-23 LAB — URINALYSIS, ROUTINE W REFLEX MICROSCOPIC
Bilirubin Urine: NEGATIVE
Glucose, UA: NEGATIVE mg/dL
Ketones, ur: NEGATIVE mg/dL
Leukocytes, UA: NEGATIVE
Nitrite: NEGATIVE
Protein, ur: NEGATIVE mg/dL
pH: 6 (ref 5.0–8.0)

## 2013-05-23 LAB — CBC WITH DIFFERENTIAL/PLATELET
Basophils Absolute: 0 10*3/uL (ref 0.0–0.1)
Basophils Relative: 0 % (ref 0–1)
Eosinophils Absolute: 0.1 10*3/uL (ref 0.0–0.7)
Eosinophils Relative: 2 % (ref 0–5)
HCT: 41 % (ref 36.0–46.0)
Hemoglobin: 13 g/dL (ref 12.0–15.0)
Lymphocytes Relative: 39 % (ref 12–46)
MCH: 30.8 pg (ref 26.0–34.0)
MCHC: 31.7 g/dL (ref 30.0–36.0)
MCV: 97.2 fL (ref 78.0–100.0)
Monocytes Absolute: 0.3 10*3/uL (ref 0.1–1.0)
Monocytes Relative: 8 % (ref 3–12)
RDW: 13.9 % (ref 11.5–15.5)
WBC: 3.7 10*3/uL — ABNORMAL LOW (ref 4.0–10.5)

## 2013-05-23 LAB — BASIC METABOLIC PANEL
BUN: 14 mg/dL (ref 6–23)
Calcium: 9.9 mg/dL (ref 8.4–10.5)
Creatinine, Ser: 1 mg/dL (ref 0.50–1.10)
GFR calc Af Amer: 64 mL/min — ABNORMAL LOW (ref >90)
GFR calc non Af Amer: 55 mL/min — ABNORMAL LOW (ref >90)
Sodium: 142 mEq/L (ref 135–145)

## 2013-05-23 MED ORDER — MORPHINE SULFATE 2 MG/ML IJ SOLN
INTRAMUSCULAR | Status: AC
  Filled 2013-05-23: qty 1

## 2013-05-23 MED ORDER — MORPHINE SULFATE 4 MG/ML IJ SOLN
6.0000 mg | Freq: Once | INTRAMUSCULAR | Status: AC
  Administered 2013-05-23: 6 mg via INTRAVENOUS
  Filled 2013-05-23: qty 2

## 2013-05-23 MED ORDER — CYCLOBENZAPRINE HCL 10 MG PO TABS: 10.0000 mg | ORAL_TABLET | Freq: Two times a day (BID) | ORAL | Status: DC | PRN

## 2013-05-23 MED ORDER — ONDANSETRON HCL 4 MG/2ML IJ SOLN
4.0000 mg | Freq: Once | INTRAMUSCULAR | Status: AC
  Administered 2013-05-23: 4 mg via INTRAVENOUS
  Filled 2013-05-23: qty 2

## 2013-05-23 MED ORDER — MORPHINE SULFATE 4 MG/ML IJ SOLN
4.0000 mg | Freq: Once | INTRAMUSCULAR | Status: AC
  Administered 2013-05-23: 4 mg via INTRAVENOUS
  Filled 2013-05-23: qty 1

## 2013-05-23 NOTE — ED Notes (Signed)
Patient here with lower back pain that has gotten worse since vacuuming today. Pain worse with any movement, denies any urinary symptoms

## 2013-05-23 NOTE — ED Provider Notes (Signed)
Medical screening examination/treatment/procedure(s) were conducted as a shared visit with non-physician practitioner(s) and myself.  I personally evaluated the patient during the encounter.  EKG Interpretation   None        Pt with some left flank pain and tenderness, has chronic low back pain, this felt different.  No urinary incontinence, good distal strength.  abd soft, no deformities.  No fever.  CT of abd/pelvis unremarkable for acute. Pain treated in the ED.  Pt and family reassured, will d/c home and follow up with PCP.    Gavin Pound. Oletta Lamas, MD 05/23/13 [phone]

## 2013-05-23 NOTE — ED Provider Notes (Signed)
CSN: [id number]     Arrival date & time 05/23/13  1745 History   First MD Initiated Contact with Patient 05/23/13 1825     Chief Complaint  Patient presents with  . Back Pain   (Consider location/radiation/quality/duration/timing/severity/associated sxs/prior Treatment) The history is provided by the patient.   Pt reports left sided back pain that has been present and gradually worsening over 1 week, significantly worse over one day.  Pain is 9/10 intensity, worse with movement.  Denies fevers, chills, body aches, abdominal pain, N/V/D, change in bowel habits, urinary symptoms, loss of control of bowel or bladder, weakness or numbness of the legs.  Pt has chronic back pain but states this is different from her typical back pain.   Past Medical History  Diagnosis Date  . Diabetes mellitus without complication   . Hypertension   . Sleep apnea   . Renal disorder   . High cholesterol    Past Surgical History  Procedure Laterality Date  . Nephrectomy    . Joint replacement    . Cholecystectomy     No family history on file. History  Substance Use Topics  . Smoking status: Never Smoker   . Smokeless tobacco: Not on file  . Alcohol Use: Not on file   OB History   Grav Para Term Preterm Abortions TAB SAB Ect Mult Living                 Review of Systems  Constitutional: Negative for fever and chills.  Respiratory: Negative for cough and shortness of breath.   Cardiovascular: Negative for chest pain.  Gastrointestinal: Negative for nausea, vomiting, abdominal pain and diarrhea.  Genitourinary: Negative for dysuria, urgency and frequency.  Neurological: Negative for weakness and numbness.  All other systems reviewed and are negative.    Allergies  Reglan and Ivp dye  Home Medications   Current Outpatient Rx  Name  Route  Sig  Dispense  Refill  . atorvastatin (LIPITOR) 20 MG tablet   Oral   Take 20 mg by mouth daily.         . furosemide (LASIX) 40 MG tablet    Oral   Take 40 mg by mouth.         . metFORMIN (GLUCOPHAGE) 500 MG tablet   Oral   Take 500 mg by mouth 2 (two) times daily with a meal.         . methadone (DOLOPHINE) 10 MG tablet   Oral   Take 10 mg by mouth every 8 (eight) hours.         . niacin (NIASPAN) 750 MG CR tablet   Oral   Take 750 mg by mouth at bedtime.         . potassium chloride (KLOR-CON) 20 MEQ packet   Oral   Take 20 mEq by mouth 2 (two) times daily.         . pregabalin (LYRICA) 25 MG capsule   Oral   Take 25 mg by mouth 2 (two) times daily.         . silver sulfADIAZINE (SILVADENE) 1 % cream   Topical   Apply topically daily.         . verapamil (COVERA HS) 240 MG (CO) 24 hr tablet   Oral   Take 240 mg by mouth at bedtime.          BP 150/72  Pulse 64  Temp(Src) 98.7 F (37.1 C) (Oral)  Resp 18  SpO2 95% Physical Exam  Nursing note and vitals reviewed. Constitutional: She appears well-developed and well-nourished. No distress.  Neck: Neck supple.  Cardiovascular: Normal rate, regular rhythm and intact distal pulses.   Pulmonary/Chest: Effort normal and breath sounds normal. No respiratory distress. She has no wheezes. She has no rales.  Abdominal: Soft. She exhibits no distension. There is tenderness in the left lower quadrant. There is no rebound and no guarding.  Musculoskeletal:  Spine nontender, no crepitus, or stepoffs.   Neurological: She is alert.  Skin: She is not diaphoretic.    ED Course  Procedures (including critical care time) Labs Review Labs Reviewed  CBC WITH DIFFERENTIAL - Abnormal; Notable for the following:    WBC 3.7 (*)    All other components within normal limits  BASIC METABOLIC PANEL - Abnormal; Notable for the following:    GFR calc non Af Amer 55 (*)    GFR calc Af Amer 64 (*)    All other components within normal limits  URINALYSIS, ROUTINE W REFLEX MICROSCOPIC - Abnormal; Notable for the following:    APPearance CLOUDY (*)    All other  components within normal limits   Imaging Review Ct Abdomen Pelvis Wo Contrast  05/23/2013   CLINICAL DATA:  Left flank pain and left lower quadrant pain for 1 week.  EXAM: CT ABDOMEN AND PELVIS WITHOUT CONTRAST  TECHNIQUE: Multidetector CT imaging of the abdomen and pelvis was performed following the standard protocol without intravenous contrast.  COMPARISON:  04/23/2013.  FINDINGS: Lung Bases: Medial lower lobe atelectasis, greater on the left than right. Coronary artery atherosclerosis is present. If office based assessment of coronary risk factors has not been performed, it is now recommended.  Liver: Unenhanced CT was performed per clinician order. Lack of IV contrast limits sensitivity and specificity, especially for evaluation of abdominal/pelvic solid viscera. liver grossly normal.  Spleen: Normal. Surgical clip adjacent to the inferior splenic pole.  Gallbladder:  Cholecystectomy with clips in the fossa.  Common bile duct:  Normal.  Pancreas:  Normal.  Adrenal glands: Left adrenal gland not identified. Right adrenal gland appears normal.  Kidneys: Left nephrectomy. Bowel all fills the nephrectomy bed. Right kidney appears within normal limits. Right ureter normal.  Stomach:  Small hiatal hernia.  Stomach partially collapsed.  Small bowel: Duodenum appears normal. No mesenteric adenopathy. No bowel obstruction.  Colon: Normal appendix. Ascending colon is distended with stool. Transverse colon anastomosis is present. Descending colon and rectosigmoid appear within normal limits aside from some diverticulosis.  Pelvic Genitourinary: No free fluid. Urinary bladder collapsed. Uterus and adnexa within normal limits. No inguinal adenopathy or pelvic sidewall adenopathy.  Bones: Bilateral hip osteoarthritis. Pubic symphysis osteoarthritis and SI joint osteoarthritis. Anterolisthesis of L5 on S1 with fusion across the disk space. Thoracic spine DISH. S-shaped thoracolumbar scoliosis.  Vasculature:  Atherosclerosis without an acute vascular abnormality accounting for noncontrast technique.  Body Wall: Anterior abdominal wall muscular atrophy. Scarring in the midline.  IMPRESSION: 1. No acute abnormality. 2. Left nephrectomy. 3. Cholecystectomy. 4. Partial transverse colectomy.   Electronically Signed   By: Andreas Newport M.D.   On: 05/23/2013 21:21    EKG Interpretation   None      Discussed pt with Dr Oletta Lamas who will also see and examine the patient.   Pt specifically requests flexeril.  Discussed all results and follow up and treatment plan with patient and family.  Answered all questions.   MDM   1. Back pain  Pt with left sided back pain and LLQ abdominal tenderness - labs, UA, CT abd/pelvis largely unremarkable.  Likely muscular back pain.  Pt requests flexeril as she has taken in in the past and it worked well for her.  D/C home with flexeril per request, which I think may work well for her.  Pt and family advised of increased fall risk with pain medications/muscle relaxants, and advised she should not drive while taking this medication.  Discussed result, findings, treatment, and follow up  with patient and family.  Pt given return precautions.  Pt verbalizes understanding and agrees with plan.       I doubt any other EMC precluding discharge at this time including, but not necessarily limited to the following: cauda equina, acute cord compression or significant nerve root compression, ruptured disk, AAA, fracture, tumor, or infection.     Trixie Dredge, PA-C 05/23/13 2320

## 2013-06-17 DIAGNOSIS — Z85528 Personal history of other malignant neoplasm of kidney: Secondary | ICD-10-CM | POA: Insufficient documentation

## 2013-06-17 DIAGNOSIS — M810 Age-related osteoporosis without current pathological fracture: Secondary | ICD-10-CM | POA: Insufficient documentation

## 2013-06-17 DIAGNOSIS — I10 Essential (primary) hypertension: Secondary | ICD-10-CM | POA: Insufficient documentation

## 2013-06-17 DIAGNOSIS — M1990 Unspecified osteoarthritis, unspecified site: Secondary | ICD-10-CM | POA: Insufficient documentation

## 2013-06-17 DIAGNOSIS — N3946 Mixed incontinence: Secondary | ICD-10-CM | POA: Insufficient documentation

## 2013-06-17 DIAGNOSIS — M47816 Spondylosis without myelopathy or radiculopathy, lumbar region: Secondary | ICD-10-CM | POA: Insufficient documentation

## 2014-11-08 ENCOUNTER — Inpatient Hospital Stay (HOSPITAL_COMMUNITY): Admission: RE | Admit: 2014-11-08 | Payer: Medicare Other | Source: Ambulatory Visit

## 2014-11-15 ENCOUNTER — Encounter (HOSPITAL_COMMUNITY): Admission: RE | Payer: Self-pay | Source: Ambulatory Visit

## 2014-11-15 ENCOUNTER — Inpatient Hospital Stay (HOSPITAL_COMMUNITY): Admission: RE | Admit: 2014-11-15 | Payer: Medicare Other | Source: Ambulatory Visit | Admitting: Oral Surgery

## 2014-11-15 SURGERY — OSTEOTOMY, LE FORT I, MAXILLA, WITH MANDIBULAR OSTEOTOMY
Anesthesia: General

## 2014-12-28 NOTE — Progress Notes (Signed)
Please put orders in Epic surgery 01-05-15 pre op 01-03-15 Thank

## 2014-12-28 NOTE — Progress Notes (Deleted)
Please put orders in Epic surgery 01-05-15 pre op 01-03-15 Thanks

## 2014-12-28 NOTE — Progress Notes (Addendum)
Please put orders in Epic surgery 01-05-15 pre op 01-03-15 Thanks

## 2014-12-30 ENCOUNTER — Encounter (HOSPITAL_COMMUNITY): Payer: Self-pay | Admitting: Oral and Maxillofacial Surgery

## 2014-12-30 DIAGNOSIS — R609 Edema, unspecified: Secondary | ICD-10-CM

## 2014-12-30 DIAGNOSIS — R600 Localized edema: Secondary | ICD-10-CM

## 2014-12-30 DIAGNOSIS — R220 Localized swelling, mass and lump, head: Secondary | ICD-10-CM

## 2014-12-30 NOTE — H&P (Signed)
Cathy Richardson is an 74 y.o. female.   Chief Complaint: Prolapsed Sublingual Gland  [id number] is a 74 year old female who was referred approximately eight months ago to our office by her restorative dentist, Dr. Leota Jacobsen, to evaluate her remaining mandibular terminal anterior dentition for removal and possible mandibular reconstruction with implants. Clinical examination at that time showed that she had terminal periodontal involvement of the mandibular anterior teeth, and a treatment plan was formulated for removal of the teeth and insertion of three implants in the anterior mandible, for an implant retained and stabilized overdenture.   The first surgical procedure was completed on 03/10/14 by Dr. Alfred Levins without immediate complications. After several weeks of healing, however, she developed herniation and prolapse of the sublingual glands into the floor of the mouth and complete loss of the mandibular lingual vestibule. The implants remained covered. Exposure of the implants proved to be difficult because of the loss of the attached gingiva on the lingual aspect of the mandible, and several attempts were made to expose the implants with more success on the distal implants in the #21 and #28 positions, however the implant in the #23 position remained covered with gingiva.   The prolapse of the sublingual glands became more intense, and completely obliterated the floor of the mouth for lingual vestibule and lingual positioning of any dental appliance. A treatment plan had been formulated for surgical repositioning of the lingual vestibule with a vestibuloplasty repair of the herniation of the sublingual glands, partial excision of the genioglossus muscle and extension of the mylohyoid muscles from the mylohyoid ridge on the edentulous mandible, and finally, repositioning of the labial vestibular vestibule with a vestibuloplasty and split thickness skin graft. Impressions were completed to  fabricate a surgical stent to reposition the labial lingual vestibule and position the skin graft in this position after the subperiosteal dissection.  After healing from the surgery   Dr. Guy Sandifer will complete prosthetic reconstruction of her missing teeth with either a locator retained overdenture, or a bar retained overdenture pending the availability of the vestibule.   PMHx:  Past Medical History  Diagnosis Date  . Diabetes mellitus without complication   . Hypertension   . Sleep apnea   . Renal disorder   . High cholesterol   Osteoarthritis Carpal Tunnel Syndrome Skin Cancer Wears C-PAP  PSx:  Past Surgical History  Procedure Laterality Date  . Nephrectomy    . Joint replacement    . Cholecystectomy    Tubal Ligation Bilateral Carpal Tunnel Releases Surgical Removal of Skin Cancer from forehead  Family Hx: Mother and Father had cardiovascular disease  Social History:  reports that she has never smoked. She does not have any smokeless tobacco history on file. Her alcohol and drug histories are not on file.  Allergies:  Allergies  Allergen Reactions  . Gabapentin Shortness Of Breath  . Reglan [Metoclopramide] Other (See Comments)    dizzy  . Tizanidine Other (See Comments)    Confusion   . Ivp Dye [Iodinated Diagnostic Agents] Rash    Meds:   Atorvastatin 20mg  daily, Methadone 10mg  three times daily, Furosemide 40mg  daily, Prevacid  once daily, Fish Oil 1000 200mg  daily, Omega3 720mg  daily, Verapamil 140mg  once daily, Potassium Chloride 20 mEq twice daily, Metformin ER 500mg  once daily at the evening meal, Escitalopram 10mg  daily,  aspirin 81mg  daily, Niacin 750mg  daily, Montelukast (Singulair) 20mg  daily, Reclast once yearly, Potassium two tablets daily, Methadone one tablet every 8 hours, Tramadol 50mg  every eight  hours, nasal decongestant one spray in each nostril every 12 hours, Ondansetron one tablet daily, Sulfadiazine ointment apply to skin as  needed.   Labs: No results found for this or any previous visit (from the past 48 hour(s)).  Radiology: No results found.  ROS: Pertinent items are noted in HPI.  Vitals: There were no vitals taken for this visit.  Physical Exam: General appearance: alert and cooperative Head: Normocephalic, without obvious abnormality, atraumatic Eyes: conjunctivae/corneas clear. PERRL, EOM's intact. Fundi benign. Ears: normal TM's and external ear canals both ears Nose: Nares normal. Septum midline. Mucosa normal. No drainage or sinus tenderness. Throat: lips and mucosa normal, but floor of the mouth is prolapsed. Neck: no adenopathy, no carotid bruit, no JVD, supple, symmetrical, trachea midline and thyroid not enlarged, symmetric, no tenderness/mass/nodules Resp: clear to auscultation bilaterally Cardio: regular rate and rhythm and slight systolic murmur GI: soft, non-tender; bowel sounds normal; no masses,  no organomegaly Extremities: extremities normal, atraumatic, no cyanosis or edema Pulses: 2+ and symmetric Skin: Skin color, texture, turgor normal. No rashes or lesions Lymph nodes: Cervical, supraclavicular, and axillary nodes normal. 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  RECOMMENDATION: Surgical treatment plan is to complete a buccal and lingual vestibuloplasty with a split thickness skin graft (from the right hip/thigh) and partial excision of the genioglossus muscle and mylohyoid muscles to give adequate lingual vestibule with repair, and reposition the bilateral sublingual gland  prolapse. Our main objective is to allow her to have prosthetic reconstruction which has been possible with her current deformity and herniation of the sublingual glands.   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 Problem 5: History of gastric ulcer controlled with Prevacid, one tablet daily. Problem 6: Chronic pain, Methadone dependent.   RECOMMENDATION:  Maintaining her current dosage of Methadone in the postoperative period and control breakthrough pain with Hydrocodone or Oxycodone.  Problem 7: Status post total knee replacement.   RECOMMENDATION: Prophylactic antibiotics prior to surgery to prevent postoperative joint replacement infection.   Richardson,Cathy Dosanjh L  12/30/2014, 4:24 PM

## 2014-12-31 NOTE — Patient Instructions (Addendum)
Charl L Syring  12/31/2014   Your procedure is scheduled on:    01/05/2015    Report to [facility] Main  Entrance and follow signs to               [place] at     [place].  Call this number if you have problems the morning of surgery [phone]   Remember: ONLY 1 PERSON MAY GO WITH YOU TO SHORT STAY TO GET  READY MORNING OF [place].  Do not eat food or drink liquids :After Midnight.     Take these medicines the morning of surgery with A SIP OF WATER:   Methadone,                                You may not have any metal on your body including hair pins and              piercings  Do not wear jewelry, make-up, lotions, powders or perfumes, deodorant             Do not wear nail polish.  Do not shave  48 hours prior to surgery.                Do not bring valuables to the hospital. [place].  Contacts, dentures or bridgework may not be worn into surgery.  Leave suitcase in the car. After surgery it may be brought to your room.        Special Instructions: coughing and deep breathing exercises, leg exercises               Please read over the following fact sheets you were given: _____________________________________________________________________             [facility] - Preparing for Surgery Before surgery, you can play an important role.  Because skin is not sterile, your skin needs to be as free of germs as possible.  You can reduce the number of germs on your skin by washing with CHG (chlorahexidine gluconate) soap before surgery.  CHG is an antiseptic cleaner which kills germs and bonds with the skin to continue killing germs even after washing. Please DO NOT use if you have an allergy to CHG or antibacterial soaps.  If your skin becomes reddened/irritated stop using the CHG and inform your nurse when you arrive at Short Stay. Do not shave (including legs and underarms) for at least 48  hours prior to the first CHG shower.  You may shave your face/neck. Please follow these instructions carefully:  1.  Shower with CHG Soap the night before surgery and the  morning of Surgery.  2.  If you choose to wash your hair, wash your hair first as usual with your  normal  shampoo.  3.  After you shampoo, rinse your hair and body thoroughly to remove the  shampoo.                           4.  Use CHG as you would any other liquid soap.  You can apply chg directly  to the skin and wash  Gently with a scrungie or clean washcloth.  5.  Apply the CHG Soap to your body ONLY FROM THE NECK DOWN.   Do not use on face/ open                           Wound or open sores. Avoid contact with eyes, ears mouth and genitals (private parts).                       Wash face,  Genitals (private parts) with your normal soap.             6.  Wash thoroughly, paying special attention to the area where your surgery  will be performed.  7.  Thoroughly rinse your body with warm water from the neck down.  8.  DO NOT shower/wash with your normal soap after using and rinsing off  the CHG Soap.                9.  Pat yourself dry with a clean towel.            10.  Wear clean pajamas.            11.  Place clean sheets on your bed the night of your first shower and do not  sleep with pets. Day of Surgery : Do not apply any lotions/deodorants the morning of surgery.  Please wear clean clothes to the hospital/surgery center.  FAILURE TO FOLLOW THESE INSTRUCTIONS MAY RESULT IN THE CANCELLATION OF YOUR SURGERY PATIENT SIGNATURE_________________________________  NURSE SIGNATURE__________________________________  ________________________________________________________________________

## 2015-01-03 ENCOUNTER — Encounter (HOSPITAL_COMMUNITY)
Admission: RE | Admit: 2015-01-03 | Discharge: 2015-01-03 | Disposition: A | Discharge status: Home / Self Care | Payer: Medicare Other | Source: Ambulatory Visit | Attending: Oral Surgery | Admitting: Oral Surgery

## 2015-01-03 ENCOUNTER — Encounter (HOSPITAL_COMMUNITY): Payer: Self-pay

## 2015-01-03 DIAGNOSIS — K118 Other diseases of salivary glands: Secondary | ICD-10-CM | POA: Diagnosis present

## 2015-01-03 DIAGNOSIS — G8929 Other chronic pain: Secondary | ICD-10-CM | POA: Diagnosis not present

## 2015-01-03 DIAGNOSIS — I509 Heart failure, unspecified: Secondary | ICD-10-CM | POA: Diagnosis not present

## 2015-01-03 DIAGNOSIS — Z6837 Body mass index (BMI) 37.0-37.9, adult: Secondary | ICD-10-CM | POA: Diagnosis not present

## 2015-01-03 DIAGNOSIS — E780 Pure hypercholesterolemia: Secondary | ICD-10-CM | POA: Diagnosis not present

## 2015-01-03 DIAGNOSIS — R51 Headache: Secondary | ICD-10-CM | POA: Diagnosis not present

## 2015-01-03 DIAGNOSIS — J45909 Unspecified asthma, uncomplicated: Secondary | ICD-10-CM | POA: Diagnosis not present

## 2015-01-03 DIAGNOSIS — M278 Other specified diseases of jaws: Secondary | ICD-10-CM | POA: Diagnosis not present

## 2015-01-03 DIAGNOSIS — Z96659 Presence of unspecified artificial knee joint: Secondary | ICD-10-CM | POA: Diagnosis not present

## 2015-01-03 DIAGNOSIS — D649 Anemia, unspecified: Secondary | ICD-10-CM | POA: Diagnosis not present

## 2015-01-03 DIAGNOSIS — I252 Old myocardial infarction: Secondary | ICD-10-CM | POA: Diagnosis not present

## 2015-01-03 DIAGNOSIS — K449 Diaphragmatic hernia without obstruction or gangrene: Secondary | ICD-10-CM | POA: Diagnosis not present

## 2015-01-03 DIAGNOSIS — Z79891 Long term (current) use of opiate analgesic: Secondary | ICD-10-CM | POA: Diagnosis not present

## 2015-01-03 DIAGNOSIS — K279 Peptic ulcer, site unspecified, unspecified as acute or chronic, without hemorrhage or perforation: Secondary | ICD-10-CM | POA: Diagnosis not present

## 2015-01-03 DIAGNOSIS — I10 Essential (primary) hypertension: Secondary | ICD-10-CM | POA: Diagnosis not present

## 2015-01-03 DIAGNOSIS — Z7982 Long term (current) use of aspirin: Secondary | ICD-10-CM | POA: Diagnosis not present

## 2015-01-03 DIAGNOSIS — E669 Obesity, unspecified: Secondary | ICD-10-CM | POA: Diagnosis not present

## 2015-01-03 DIAGNOSIS — K219 Gastro-esophageal reflux disease without esophagitis: Secondary | ICD-10-CM | POA: Diagnosis not present

## 2015-01-03 DIAGNOSIS — E119 Type 2 diabetes mellitus without complications: Secondary | ICD-10-CM | POA: Diagnosis not present

## 2015-01-03 DIAGNOSIS — Z7983 Long term (current) use of bisphosphonates: Secondary | ICD-10-CM | POA: Diagnosis not present

## 2015-01-03 DIAGNOSIS — G4730 Sleep apnea, unspecified: Secondary | ICD-10-CM | POA: Diagnosis not present

## 2015-01-03 DIAGNOSIS — N289 Disorder of kidney and ureter, unspecified: Secondary | ICD-10-CM | POA: Diagnosis not present

## 2015-01-03 DIAGNOSIS — Z79899 Other long term (current) drug therapy: Secondary | ICD-10-CM | POA: Diagnosis not present

## 2015-01-03 DIAGNOSIS — Z905 Acquired absence of kidney: Secondary | ICD-10-CM | POA: Diagnosis not present

## 2015-01-03 DIAGNOSIS — F329 Major depressive disorder, single episode, unspecified: Secondary | ICD-10-CM | POA: Diagnosis not present

## 2015-01-03 DIAGNOSIS — M1990 Unspecified osteoarthritis, unspecified site: Secondary | ICD-10-CM | POA: Diagnosis not present

## 2015-01-03 HISTORY — DX: Personal history of other diseases of the digestive system: Z87.19

## 2015-01-03 HISTORY — DX: Urinary tract infection, site not specified: N39.0

## 2015-01-03 HISTORY — DX: Hypersomnia, unspecified: G47.10

## 2015-01-03 HISTORY — DX: Major depressive disorder, single episode, unspecified: F32.9

## 2015-01-03 HISTORY — DX: Age-related osteoporosis without current pathological fracture: M81.0

## 2015-01-03 HISTORY — DX: Cardiac murmur, unspecified: R01.1

## 2015-01-03 HISTORY — DX: Headache, unspecified: R51.9

## 2015-01-03 HISTORY — DX: Depression, unspecified: F32.A

## 2015-01-03 HISTORY — DX: Family history of other specified conditions: Z84.89

## 2015-01-03 HISTORY — DX: Unspecified asthma, uncomplicated: J45.909

## 2015-01-03 HISTORY — DX: Anemia, unspecified: D64.9

## 2015-01-03 HISTORY — DX: Headache: R51

## 2015-01-03 HISTORY — DX: Gastro-esophageal reflux disease without esophagitis: K21.9

## 2015-01-03 LAB — CBC
HCT: 37.2 % (ref 36.0–46.0)
Hemoglobin: 11 g/dL — ABNORMAL LOW (ref 12.0–15.0)
MCH: 27.6 pg (ref 26.0–34.0)
MCHC: 29.6 g/dL — AB (ref 30.0–36.0)
MCV: 93.5 fL (ref 78.0–100.0)
Platelets: 281 10*3/uL (ref 150–400)
RBC: 3.98 MIL/uL (ref 3.87–5.11)
RDW: 14.8 % (ref 11.5–15.5)
WBC: 3.6 10*3/uL — ABNORMAL LOW (ref 4.0–10.5)

## 2015-01-03 LAB — BASIC METABOLIC PANEL
Anion gap: 9 (ref 5–15)
BUN: 17 mg/dL (ref 6–20)
CO2: 33 mmol/L — ABNORMAL HIGH (ref 22–32)
CREATININE: 1.1 mg/dL — AB (ref 0.44–1.00)
Calcium: 9.5 mg/dL (ref 8.9–10.3)
Chloride: 99 mmol/L — ABNORMAL LOW (ref 101–111)
GFR calc non Af Amer: 49 mL/min — ABNORMAL LOW (ref >60)
GFR, EST AFRICAN AMERICAN: 56 mL/min — AB (ref >60)
Glucose, Bld: 86 mg/dL (ref 65–99)
Potassium: 4.6 mmol/L (ref 3.5–5.1)
Sodium: 141 mmol/L (ref 135–145)

## 2015-01-03 NOTE — Anesthesia Preprocedure Evaluation (Addendum)
Anesthesia Evaluation  Patient identified by MRN, date of birth, ID band  Reviewed: Allergy & Precautions, NPO status , Patient's Chart, lab work & pertinent test results, Unable to perform ROS - Chart review only  History of Anesthesia Complications (+) Family history of anesthesia reactionNegative for: history of anesthetic complications  Airway Mallampati: III  TM Distance: >3 FB Neck ROM: Full    Dental  (+) Edentulous Upper, Edentulous Lower   Pulmonary asthma , sleep apnea and Continuous Positive Airway Pressure Ventilation ,  breath sounds clear to auscultation  Pulmonary exam normal       Cardiovascular hypertension, Pt. on medications - angina- Past MI and - CHF Normal cardiovascular examRhythm:Regular Rate:Normal     Neuro/Psych  Headaches, PSYCHIATRIC DISORDERS Depression    GI/Hepatic Neg liver ROS, hiatal hernia, PUD, GERD-  Medicated,  Endo/Other  diabetes, Type 2, Oral Hypoglycemic Agents  Renal/GU Renal InsufficiencyRenal disease  negative genitourinary   Musculoskeletal  (+) Arthritis -,   Abdominal (+) + obese,   Peds negative pediatric ROS (+)  Hematology  (+) anemia ,   Anesthesia Other Findings   Reproductive/Obstetrics negative OB ROS                          Anesthesia Physical Anesthesia Plan  ASA: III  Anesthesia Plan: General   Post-op Pain Management:    Induction: Intravenous  Airway Management Planned: Nasal ETT  Additional Equipment: None  Intra-op Plan:   Post-operative Plan: Extubation in OR  Informed Consent: I have reviewed the patients History and Physical, chart, labs and discussed the procedure including the risks, benefits and alternatives for the proposed anesthesia with the patient or authorized representative who has indicated his/her understanding and acceptance.   Dental advisory given  Plan Discussed with: CRNA, Anesthesiologist and  Surgeon  Anesthesia Plan Comments:        Anesthesia Quick Evaluation

## 2015-01-03 NOTE — Progress Notes (Signed)
EKG- 11/02/14 EPIC  LOV- 11/02/14- Dr Sallyanne Havers on chart.

## 2015-01-03 NOTE — Progress Notes (Signed)
DR Delma Post( anesthesia0 made aware patient here for preop appointment. Dr Delma Post looked patient history in computer and stated anesthesia would see patient am of surgery.

## 2015-01-03 NOTE — Progress Notes (Signed)
BMP results done 01/03/2015 faxed via EPIC to Dr Alfred Levins and Dr Tamela Oddi via [facility]PIc.

## 2015-01-04 ENCOUNTER — Encounter (HOSPITAL_COMMUNITY): Payer: Self-pay

## 2015-01-04 NOTE — Progress Notes (Signed)
Requested by fax and confirmation received - requested LOV note from Dr Marijean Bravo- neurology.

## 2015-01-04 NOTE — Progress Notes (Signed)
LOV- Dr Marijean Bravo- Neurology on chart dated 12/14/2014

## 2015-01-05 ENCOUNTER — Inpatient Hospital Stay (HOSPITAL_COMMUNITY): Payer: Medicare Other | Admitting: Anesthesiology

## 2015-01-05 ENCOUNTER — Encounter (HOSPITAL_COMMUNITY)
Admission: RE | Disposition: A | Discharge status: Home / Self Care | Payer: Self-pay | Source: Ambulatory Visit | Attending: Oral Surgery

## 2015-01-05 ENCOUNTER — Encounter (HOSPITAL_COMMUNITY): Payer: Self-pay

## 2015-01-05 ENCOUNTER — Observation Stay (HOSPITAL_COMMUNITY)
Admission: RE | Admit: 2015-01-05 | Discharge: 2015-01-06 | Disposition: A | Discharge status: Home / Self Care | Payer: Medicare Other | Source: Ambulatory Visit | Attending: Oral Surgery | Admitting: Oral Surgery

## 2015-01-05 DIAGNOSIS — K118 Other diseases of salivary glands: Principal | ICD-10-CM | POA: Insufficient documentation

## 2015-01-05 DIAGNOSIS — Z7982 Long term (current) use of aspirin: Secondary | ICD-10-CM | POA: Insufficient documentation

## 2015-01-05 DIAGNOSIS — K279 Peptic ulcer, site unspecified, unspecified as acute or chronic, without hemorrhage or perforation: Secondary | ICD-10-CM | POA: Insufficient documentation

## 2015-01-05 DIAGNOSIS — G8929 Other chronic pain: Secondary | ICD-10-CM | POA: Insufficient documentation

## 2015-01-05 DIAGNOSIS — R51 Headache: Secondary | ICD-10-CM | POA: Insufficient documentation

## 2015-01-05 DIAGNOSIS — E780 Pure hypercholesterolemia: Secondary | ICD-10-CM | POA: Insufficient documentation

## 2015-01-05 DIAGNOSIS — R609 Edema, unspecified: Secondary | ICD-10-CM

## 2015-01-05 DIAGNOSIS — Z96659 Presence of unspecified artificial knee joint: Secondary | ICD-10-CM | POA: Insufficient documentation

## 2015-01-05 DIAGNOSIS — F329 Major depressive disorder, single episode, unspecified: Secondary | ICD-10-CM | POA: Insufficient documentation

## 2015-01-05 DIAGNOSIS — J45909 Unspecified asthma, uncomplicated: Secondary | ICD-10-CM | POA: Insufficient documentation

## 2015-01-05 DIAGNOSIS — K449 Diaphragmatic hernia without obstruction or gangrene: Secondary | ICD-10-CM | POA: Insufficient documentation

## 2015-01-05 DIAGNOSIS — Z79891 Long term (current) use of opiate analgesic: Secondary | ICD-10-CM | POA: Insufficient documentation

## 2015-01-05 DIAGNOSIS — M1990 Unspecified osteoarthritis, unspecified site: Secondary | ICD-10-CM | POA: Insufficient documentation

## 2015-01-05 DIAGNOSIS — K219 Gastro-esophageal reflux disease without esophagitis: Secondary | ICD-10-CM | POA: Insufficient documentation

## 2015-01-05 DIAGNOSIS — I252 Old myocardial infarction: Secondary | ICD-10-CM | POA: Insufficient documentation

## 2015-01-05 DIAGNOSIS — M278 Other specified diseases of jaws: Secondary | ICD-10-CM | POA: Insufficient documentation

## 2015-01-05 DIAGNOSIS — E119 Type 2 diabetes mellitus without complications: Secondary | ICD-10-CM | POA: Insufficient documentation

## 2015-01-05 DIAGNOSIS — Z7983 Long term (current) use of bisphosphonates: Secondary | ICD-10-CM | POA: Insufficient documentation

## 2015-01-05 DIAGNOSIS — Z6837 Body mass index (BMI) 37.0-37.9, adult: Secondary | ICD-10-CM | POA: Insufficient documentation

## 2015-01-05 DIAGNOSIS — G4730 Sleep apnea, unspecified: Secondary | ICD-10-CM | POA: Insufficient documentation

## 2015-01-05 DIAGNOSIS — I10 Essential (primary) hypertension: Secondary | ICD-10-CM | POA: Insufficient documentation

## 2015-01-05 DIAGNOSIS — Z905 Acquired absence of kidney: Secondary | ICD-10-CM | POA: Insufficient documentation

## 2015-01-05 DIAGNOSIS — I509 Heart failure, unspecified: Secondary | ICD-10-CM | POA: Insufficient documentation

## 2015-01-05 DIAGNOSIS — E669 Obesity, unspecified: Secondary | ICD-10-CM | POA: Insufficient documentation

## 2015-01-05 DIAGNOSIS — Z79899 Other long term (current) drug therapy: Secondary | ICD-10-CM | POA: Insufficient documentation

## 2015-01-05 DIAGNOSIS — R600 Localized edema: Secondary | ICD-10-CM | POA: Diagnosis present

## 2015-01-05 DIAGNOSIS — N289 Disorder of kidney and ureter, unspecified: Secondary | ICD-10-CM | POA: Insufficient documentation

## 2015-01-05 DIAGNOSIS — R220 Localized swelling, mass and lump, head: Secondary | ICD-10-CM

## 2015-01-05 DIAGNOSIS — D649 Anemia, unspecified: Secondary | ICD-10-CM | POA: Insufficient documentation

## 2015-01-05 HISTORY — DX: Malignant (primary) neoplasm, unspecified: C80.1

## 2015-01-05 HISTORY — PX: SKIN SPLIT GRAFT: SHX444

## 2015-01-05 HISTORY — DX: Unspecified glaucoma: H40.9

## 2015-01-05 HISTORY — DX: Peptic ulcer, site unspecified, unspecified as acute or chronic, without hemorrhage or perforation: K27.9

## 2015-01-05 HISTORY — DX: Unspecified osteoarthritis, unspecified site: M19.90

## 2015-01-05 LAB — GLUCOSE, CAPILLARY
GLUCOSE-CAPILLARY: 131 mg/dL — AB (ref 65–99)
Glucose-Capillary: 105 mg/dL — ABNORMAL HIGH (ref 65–99)

## 2015-01-05 SURGERY — APPLICATION, GRAFT, SKIN, SPLIT-THICKNESS
Anesthesia: General | Site: Mouth

## 2015-01-05 MED ORDER — ASPIRIN EC 81 MG PO TBEC
81.0000 mg | DELAYED_RELEASE_TABLET | Freq: Every day | ORAL | Status: DC
  Filled 2015-01-05 (×2): qty 1

## 2015-01-05 MED ORDER — HYDRALAZINE HCL 20 MG/ML IJ SOLN
10.0000 mg | Freq: Once | INTRAMUSCULAR | Status: AC
  Administered 2015-01-05: 10 mg via INTRAVENOUS

## 2015-01-05 MED ORDER — FLUTICASONE PROPIONATE 50 MCG/ACT NA SUSP
2.0000 | Freq: Every evening | NASAL | Status: DC
  Administered 2015-01-05: 2 via NASAL
  Filled 2015-01-05: qty 16

## 2015-01-05 MED ORDER — ACETAMINOPHEN 325 MG PO TABS: 325.0000 mg | ORAL_TABLET | ORAL | Status: DC | PRN

## 2015-01-05 MED ORDER — CALCIUM CARBONATE ANTACID 500 MG PO CHEW
2.0000 | CHEWABLE_TABLET | Freq: Every morning | ORAL | Status: DC
  Filled 2015-01-05: qty 2

## 2015-01-05 MED ORDER — LIDOCAINE-EPINEPHRINE 2 %-1:100000 IJ SOLN
INTRAMUSCULAR | Status: AC
  Filled 2015-01-05: qty 13.6

## 2015-01-05 MED ORDER — CHLORHEXIDINE GLUCONATE 4 % EX LIQD: 1.0000 "application " | Freq: Once | CUTANEOUS | Status: DC

## 2015-01-05 MED ORDER — DEXTROSE 5 % IV SOLN
500.0000 mg | Freq: Four times a day (QID) | INTRAVENOUS | Status: AC
  Administered 2015-01-05 – 2015-01-06 (×3): 500 mg via INTRAVENOUS
  Filled 2015-01-05 (×3): qty 5

## 2015-01-05 MED ORDER — POLYETHYLENE GLYCOL 400 0.25 % OP SOLN: Freq: Every day | OPHTHALMIC | Status: DC | PRN

## 2015-01-05 MED ORDER — HYDROMORPHONE HCL 1 MG/ML IJ SOLN
INTRAMUSCULAR | Status: AC
  Administered 2015-01-05: 0.5 mg via INTRAVENOUS
  Filled 2015-01-05: qty 1

## 2015-01-05 MED ORDER — GLYCOPYRROLATE 0.2 MG/ML IJ SOLN
INTRAMUSCULAR | Status: AC
  Filled 2015-01-05: qty 1

## 2015-01-05 MED ORDER — DEXAMETHASONE SODIUM PHOSPHATE 10 MG/ML IJ SOLN
INTRAMUSCULAR | Status: DC | PRN
  Administered 2015-01-05: 10 mg via INTRAVENOUS

## 2015-01-05 MED ORDER — DIPHENHYDRAMINE HCL 50 MG/ML IJ SOLN: 12.5000 mg | Freq: Four times a day (QID) | INTRAMUSCULAR | Status: DC | PRN

## 2015-01-05 MED ORDER — HYDROMORPHONE HCL 1 MG/ML IJ SOLN
0.2500 mg | INTRAMUSCULAR | Status: DC | PRN
  Administered 2015-01-05 (×4): 0.5 mg via INTRAVENOUS

## 2015-01-05 MED ORDER — BUPIVACAINE-EPINEPHRINE (PF) 0.5% -1:200000 IJ SOLN
INTRAMUSCULAR | Status: AC
  Filled 2015-01-05: qty 7.2

## 2015-01-05 MED ORDER — SODIUM CHLORIDE 0.45 % IV SOLN
INTRAVENOUS | Status: DC
  Administered 2015-01-05: 16:00:00 via INTRAVENOUS
  Administered 2015-01-06: 75 mL/h via INTRAVENOUS

## 2015-01-05 MED ORDER — VERAPAMIL HCL ER 240 MG PO TBCR
240.0000 mg | EXTENDED_RELEASE_TABLET | Freq: Every day | ORAL | Status: DC
  Filled 2015-01-05 (×2): qty 1

## 2015-01-05 MED ORDER — POLYETHYLENE GLYCOL 3350 17 G PO PACK
17.0000 g | PACK | Freq: Every evening | ORAL | Status: DC
  Filled 2015-01-05: qty 1

## 2015-01-05 MED ORDER — ATORVASTATIN CALCIUM 20 MG PO TABS
20.0000 mg | ORAL_TABLET | Freq: Every day | ORAL | Status: DC
Start: 2015-01-05 — End: 2015-01-06
  Filled 2015-01-05 (×2): qty 1

## 2015-01-05 MED ORDER — PROPOFOL 10 MG/ML IV BOLUS
INTRAVENOUS | Status: AC
  Filled 2015-01-05: qty 20

## 2015-01-05 MED ORDER — ESCITALOPRAM OXALATE 10 MG PO TABS
10.0000 mg | ORAL_TABLET | Freq: Every day | ORAL | Status: DC
  Filled 2015-01-05 (×2): qty 1

## 2015-01-05 MED ORDER — FENTANYL CITRATE (PF) 100 MCG/2ML IJ SOLN
INTRAMUSCULAR | Status: DC | PRN
  Administered 2015-01-05: 50 ug via INTRAVENOUS
  Administered 2015-01-05 (×2): 25 ug via INTRAVENOUS
  Administered 2015-01-05: 50 ug via INTRAVENOUS

## 2015-01-05 MED ORDER — POLYETHYLENE GLYCOL 3350 17 GM/SCOOP PO POWD: 17.0000 g | Freq: Every evening | ORAL | Status: DC

## 2015-01-05 MED ORDER — DIPHENHYDRAMINE HCL 12.5 MG/5ML PO ELIX: 12.5000 mg | ORAL_SOLUTION | Freq: Four times a day (QID) | ORAL | Status: DC | PRN

## 2015-01-05 MED ORDER — FUROSEMIDE 40 MG PO TABS
40.0000 mg | ORAL_TABLET | Freq: Every day | ORAL | Status: DC
  Filled 2015-01-05 (×2): qty 1

## 2015-01-05 MED ORDER — ETOMIDATE 2 MG/ML IV SOLN
INTRAVENOUS | Status: DC | PRN
  Administered 2015-01-05: 10 mg via INTRAVENOUS

## 2015-01-05 MED ORDER — POTASSIUM CHLORIDE ER 10 MEQ PO TBCR
20.0000 meq | EXTENDED_RELEASE_TABLET | Freq: Two times a day (BID) | ORAL | Status: DC
  Administered 2015-01-05: 20 meq via ORAL
  Filled 2015-01-05 (×4): qty 2

## 2015-01-05 MED ORDER — CEFAZOLIN SODIUM-DEXTROSE 2-3 GM-% IV SOLR
INTRAVENOUS | Status: AC
  Filled 2015-01-05: qty 50

## 2015-01-05 MED ORDER — HYDROMORPHONE 0.3 MG/ML IV SOLN
INTRAVENOUS | Status: DC
  Administered 2015-01-05: 2.79 mg via INTRAVENOUS
  Administered 2015-01-05: 13:00:00 via INTRAVENOUS
  Administered 2015-01-05: 4.39 mg via INTRAVENOUS
  Administered 2015-01-06 (×2): 1.59 mg via INTRAVENOUS
  Administered 2015-01-06: 3.19 mg via INTRAVENOUS
  Filled 2015-01-05: qty 25

## 2015-01-05 MED ORDER — VITAMIN D 1000 UNITS PO TABS
2000.0000 [IU] | ORAL_TABLET | Freq: Every day | ORAL | Status: DC
  Filled 2015-01-05 (×2): qty 2

## 2015-01-05 MED ORDER — BUPIVACAINE-EPINEPHRINE (PF) 0.5% -1:200000 IJ SOLN
INTRAMUSCULAR | Status: DC | PRN
  Administered 2015-01-05: 10 mL

## 2015-01-05 MED ORDER — SILVER SULFADIAZINE 1 % EX CREA: 1.0000 "application " | TOPICAL_CREAM | Freq: Two times a day (BID) | CUTANEOUS | Status: DC | PRN

## 2015-01-05 MED ORDER — CHLORHEXIDINE GLUCONATE 0.12 % MT SOLN
15.0000 mL | Freq: Two times a day (BID) | OROMUCOSAL | Status: DC
  Administered 2015-01-05: 15 mL via OROMUCOSAL
  Filled 2015-01-05 (×3): qty 15

## 2015-01-05 MED ORDER — OXYCODONE HCL 5 MG PO TABS: 5.0000 mg | ORAL_TABLET | Freq: Once | ORAL | Status: DC | PRN

## 2015-01-05 MED ORDER — METHYLPHENIDATE HCL 5 MG PO TABS: 5.0000 mg | ORAL_TABLET | Freq: Two times a day (BID) | ORAL | Status: DC

## 2015-01-05 MED ORDER — ONDANSETRON HCL 4 MG PO TABS
4.0000 mg | ORAL_TABLET | Freq: Every day | ORAL | Status: DC | PRN
Start: 2015-01-05 — End: 2015-01-06
  Administered 2015-01-06: 4 mg via ORAL
  Filled 2015-01-05: qty 1

## 2015-01-05 MED ORDER — ONDANSETRON HCL 4 MG/2ML IJ SOLN
INTRAMUSCULAR | Status: AC
  Filled 2015-01-05: qty 2

## 2015-01-05 MED ORDER — MINERAL OIL LIGHT 100 % EX OIL
TOPICAL_OIL | CUTANEOUS | Status: AC
  Filled 2015-01-05: qty 25

## 2015-01-05 MED ORDER — GLYCOPYRROLATE 0.2 MG/ML IJ SOLN
INTRAMUSCULAR | Status: DC | PRN
  Administered 2015-01-05 (×2): 0.2 mg via INTRAVENOUS
  Administered 2015-01-05: .8 mg via INTRAVENOUS

## 2015-01-05 MED ORDER — BACITRACIN ZINC 500 UNIT/GM EX OINT
TOPICAL_OINTMENT | CUTANEOUS | Status: AC
  Filled 2015-01-05: qty 28.35

## 2015-01-05 MED ORDER — POLYETHYLENE GLYCOL 3350 17 GM/SCOOP PO POWD
17.0000 g | Freq: Every evening | ORAL | Status: DC
  Filled 2015-01-05: qty 255

## 2015-01-05 MED ORDER — HYDROMORPHONE 0.3 MG/ML IV SOLN
INTRAVENOUS | Status: AC
  Filled 2015-01-05: qty 25

## 2015-01-05 MED ORDER — ONDANSETRON HCL 4 MG/2ML IJ SOLN
INTRAMUSCULAR | Status: DC | PRN
  Administered 2015-01-05: 4 mg via INTRAVENOUS

## 2015-01-05 MED ORDER — OXYMETAZOLINE HCL 0.05 % NA SOLN
NASAL | Status: AC
  Filled 2015-01-05: qty 15

## 2015-01-05 MED ORDER — SODIUM CHLORIDE 0.9 % IJ SOLN: 9.0000 mL | INTRAMUSCULAR | Status: DC | PRN

## 2015-01-05 MED ORDER — LIDOCAINE-EPINEPHRINE 2 %-1:100000 IJ SOLN
INTRAMUSCULAR | Status: DC | PRN
  Administered 2015-01-05: 12 mL

## 2015-01-05 MED ORDER — PANTOPRAZOLE SODIUM 40 MG PO TBEC
40.0000 mg | DELAYED_RELEASE_TABLET | Freq: Every day | ORAL | Status: DC
  Filled 2015-01-05: qty 1

## 2015-01-05 MED ORDER — OXYCODONE HCL 5 MG/5ML PO SOLN
5.0000 mg | Freq: Once | ORAL | Status: DC | PRN
  Filled 2015-01-05: qty 5

## 2015-01-05 MED ORDER — BACITRACIN-NEOMYCIN-POLYMYXIN OINTMENT TUBE
TOPICAL_OINTMENT | CUTANEOUS | Status: DC | PRN
  Filled 2015-01-05: qty 15

## 2015-01-05 MED ORDER — ACETAMINOPHEN 160 MG/5ML PO SOLN
325.0000 mg | ORAL | Status: DC | PRN
  Filled 2015-01-05: qty 20.3

## 2015-01-05 MED ORDER — HYDRALAZINE HCL 20 MG/ML IJ SOLN
INTRAMUSCULAR | Status: AC
  Administered 2015-01-05: 10 mg via INTRAVENOUS
  Filled 2015-01-05: qty 1

## 2015-01-05 MED ORDER — SUCCINYLCHOLINE CHLORIDE 20 MG/ML IJ SOLN
INTRAMUSCULAR | Status: DC | PRN
  Administered 2015-01-05: 100 mg via INTRAVENOUS

## 2015-01-05 MED ORDER — BACITRACIN-NEOMYCIN-POLYMYXIN 400-5-5000 EX OINT
TOPICAL_OINTMENT | CUTANEOUS | Status: AC
  Filled 2015-01-05: qty 1

## 2015-01-05 MED ORDER — PROPOFOL 10 MG/ML IV BOLUS
INTRAVENOUS | Status: DC | PRN
  Administered 2015-01-05: 100 mg via INTRAVENOUS

## 2015-01-05 MED ORDER — FENTANYL CITRATE (PF) 100 MCG/2ML IJ SOLN
INTRAMUSCULAR | Status: AC
  Filled 2015-01-05: qty 2

## 2015-01-05 MED ORDER — POLYVINYL ALCOHOL 1.4 % OP SOLN
1.0000 [drp] | Freq: Every day | OPHTHALMIC | Status: DC | PRN
  Filled 2015-01-05: qty 15

## 2015-01-05 MED ORDER — OXYCODONE-ACETAMINOPHEN 5-325 MG PO TABS
1.0000 | ORAL_TABLET | Freq: Four times a day (QID) | ORAL | Status: DC
  Administered 2015-01-05 – 2015-01-06 (×2): 1 via ORAL
  Filled 2015-01-05 (×3): qty 1

## 2015-01-05 MED ORDER — NALOXONE HCL 0.4 MG/ML IJ SOLN: 0.4000 mg | INTRAMUSCULAR | Status: DC | PRN

## 2015-01-05 MED ORDER — DEXAMETHASONE SODIUM PHOSPHATE 10 MG/ML IJ SOLN
INTRAMUSCULAR | Status: AC
  Filled 2015-01-05: qty 1

## 2015-01-05 MED ORDER — CEFAZOLIN SODIUM-DEXTROSE 2-3 GM-% IV SOLR
2.0000 g | INTRAVENOUS | Status: AC
  Administered 2015-01-05: 2 g via INTRAVENOUS

## 2015-01-05 MED ORDER — METFORMIN HCL ER 500 MG PO TB24
500.0000 mg | ORAL_TABLET | Freq: Every day | ORAL | Status: DC
  Filled 2015-01-05 (×2): qty 1

## 2015-01-05 MED ORDER — NEOSTIGMINE METHYLSULFATE 10 MG/10ML IV SOLN
INTRAVENOUS | Status: DC | PRN
  Administered 2015-01-05: 5 mg via INTRAVENOUS

## 2015-01-05 MED ORDER — CISATRACURIUM BESYLATE 20 MG/10ML IV SOLN
INTRAVENOUS | Status: AC
  Filled 2015-01-05: qty 10

## 2015-01-05 MED ORDER — ISOPROPYL ALCOHOL 70 % SOLN
Status: AC
  Filled 2015-01-05: qty 480

## 2015-01-05 MED ORDER — CISATRACURIUM BESYLATE (PF) 10 MG/5ML IV SOLN
INTRAVENOUS | Status: DC | PRN
  Administered 2015-01-05 (×2): 2 mg via INTRAVENOUS
  Administered 2015-01-05: 7 mg via INTRAVENOUS
  Administered 2015-01-05: 4 mg via INTRAVENOUS

## 2015-01-05 MED ORDER — GLYCOPYRROLATE 0.2 MG/ML IJ SOLN
INTRAMUSCULAR | Status: AC
  Filled 2015-01-05: qty 4

## 2015-01-05 MED ORDER — LACTATED RINGERS IV SOLN
INTRAVENOUS | Status: DC | PRN
  Administered 2015-01-05 (×2): via INTRAVENOUS

## 2015-01-05 SURGICAL SUPPLY — 32 items
BAG SPEC THK2 15X12 ZIP CLS (MISCELLANEOUS) ×1
BAG ZIPLOCK 12X15 (MISCELLANEOUS) ×3 IMPLANT
BLADE DERMATOME SS (BLADE) ×4 IMPLANT
BLADE SURG 15 STRL LF DISP TIS (BLADE) ×2 IMPLANT
BLADE SURG 15 STRL SS (BLADE) ×6
CANNULA VESSEL W/WING WO/VALVE (CANNULA) ×3 IMPLANT
DRSG EMULSION OIL 3X16 NADH (GAUZE/BANDAGES/DRESSINGS) ×2 IMPLANT
DRSG PAD ABDOMINAL 8X10 ST (GAUZE/BANDAGES/DRESSINGS) ×2 IMPLANT
DRSG TEGADERM 8X12 (GAUZE/BANDAGES/DRESSINGS) ×6 IMPLANT
ELECT PENCIL ROCKER SW 15FT (MISCELLANEOUS) ×2 IMPLANT
ELECT REM PT RETURN 9FT ADLT (ELECTROSURGICAL) ×6
ELECTRODE REM PT RTRN 9FT ADLT (ELECTROSURGICAL) IMPLANT
GAUZE SPONGE 4X4 16PLY XRAY LF (GAUZE/BANDAGES/DRESSINGS) ×3 IMPLANT
GLOVE SURG SS PI 8.0 STRL IVOR (GLOVE) ×3 IMPLANT
KIT BASIN OR (CUSTOM PROCEDURE TRAY) ×5 IMPLANT
NDL DENTAL 27 LONG (NEEDLE) ×2 IMPLANT
NEEDLE DENTAL 27 LONG (NEEDLE) ×6 IMPLANT
NS IRRIG 1000ML POUR BTL (IV SOLUTION) ×5 IMPLANT
PACK EENT SPLIT (PACKS) ×3 IMPLANT
PACK GENERAL/GYN (CUSTOM PROCEDURE TRAY) ×2 IMPLANT
PACKING VAGINAL (PACKING) ×3 IMPLANT
PAD ABD 8X10 STRL (GAUZE/BANDAGES/DRESSINGS) ×4 IMPLANT
SOL PREP POV-IOD 4OZ 10% (MISCELLANEOUS) ×3 IMPLANT
SUT CHROMIC 3 0 PS 2 (SUTURE) ×12 IMPLANT
SUT VIC AB 2-0 CT1 27 (SUTURE) ×3
SUT VIC AB 2-0 CT1 TAPERPNT 27 (SUTURE) IMPLANT
SUT VIC AB 3-0 PS2 18 (SUTURE) ×18
SUT VIC AB 3-0 PS2 18XBRD (SUTURE) IMPLANT
SYR 50ML LL SCALE MARK (SYRINGE) ×3 IMPLANT
TOOTHBRUSH ADULT (PERSONAL CARE ITEMS) ×3 IMPLANT
WATER STERILE IRR 1500ML POUR (IV SOLUTION) ×3 IMPLANT
YANKAUER SUCT BULB TIP NO VENT (SUCTIONS) ×3 IMPLANT

## 2015-01-05 NOTE — Anesthesia Postprocedure Evaluation (Signed)
  Anesthesia Post-op Note  Patient: Cathy Richardson  Procedure(s) Performed: Procedure(s): MANDIBULAR LINGUAL SPLIT THICKNESS SKIN GRAFT VESTIVULOPLASTY (N/A)  Patient Location: PACU  Anesthesia Type:General  Level of Consciousness: awake  Airway and Oxygen Therapy: Patient Spontanous Breathing and Patient connected to nasal cannula oxygen  Post-op Pain: mild  Post-op Assessment: Post-op Vital signs reviewed, Patient's Cardiovascular Status Stable, Respiratory Function Stable, Patent Airway, No signs of Nausea or vomiting and Pain level controlled              Post-op Vital Signs: Reviewed and stable  Last Vitals:  Filed Vitals:   01/05/15 1327  BP: 158/70  Pulse: 95  Temp: 37.1 C  Resp: 18    Complications: No apparent anesthesia complications

## 2015-01-05 NOTE — Interval H&P Note (Signed)
History and Physical Interval Note:  01/05/2015 7:56 AM  Cathy Richardson  has presented today for surgery, with the diagnosis of hypertrophy of the sublingual glands and prolapse of sublingual glands into the floor of mouth.  The various methods of treatment have been discussed with the patient and family. After consideration of risks, benefits and other options for treatment, the patient has consented to Procedure:  MANDIBULAR LINGUAL SPLIT THICKNESS SKIN GRAFT VESTIBULOPLASTY with harvest from the right hip as a surgical intervention .  The patient's history has been reviewed, patient examined, no change in status, stable for surgery.  I have reviewed the patient's chart and labs.  Questions were answered to the patient's satisfaction.     Tangier,[name]

## 2015-01-05 NOTE — Transfer of Care (Signed)
Immediate Anesthesia Transfer of Care Note  Patient: Cathy Richardson  Procedure(s) Performed: Procedure(s): MANDIBULAR LINGUAL SPLIT THICKNESS SKIN GRAFT VESTIVULOPLASTY (N/A)  Patient Location: PACU  Anesthesia Type:General  Level of Consciousness: awake, sedated and patient cooperative  Airway & Oxygen Therapy: Patient Spontanous Breathing and Patient connected to nasal cannula oxygen  Post-op Assessment: Report given to RN and Post -op Vital signs reviewed and stable  Post vital signs: Reviewed and stable  Last Vitals:  Filed Vitals:   01/05/15 0627  BP: 124/61  Pulse: 95  Temp: 36.8 C  Resp: 18    Complications: No apparent anesthesia complications

## 2015-01-05 NOTE — Brief Op Note (Signed)
01/05/2015  11:59 AM  PATIENT:  Cathy Richardson  74 y.o. female  PRE-OPERATIVE DIAGNOSIS:  Hypertrophy of the sublingual glands and prolapse of sublingual glands into the floor of mouth  POST-OPERATIVE DIAGNOSIS:  Hypertrophy of the sublingual glands and prolapse of the sublingual glands into the floor of the mouth.   PROCEDURE:   MANDIBULAR VESTIBULOPLASTY WITH SPLIT THICKNESS SKIN GRAFT FROM THE RIGHT HIP  SURGEON:  Surgeon(s) and Role:    * Michaela Corner, DDS - Primary    * Tamela Oddi, DDS - Assisting  PHYSICIAN ASSISTANT: NONE   ASSISTANTS: Moshe Salisbury, Ottie Glazier   ANESTHESIA:   general  EBL:  Total I/O In: 1000 [I.V.:1000] Out: -   BLOOD ADMINISTERED:none  DRAINS: none   LOCAL MEDICATIONS USED:  MARCAINE    and LIDOCAINE   SPECIMEN:  No Specimen  DISPOSITION OF SPECIMEN:  N/A  COUNTS:  YES  TOURNIQUET:  * No tourniquets in log *  DICTATION: .Dragon Dictation  PLAN OF CARE: Admit to inpatient   PATIENT DISPOSITION:  PACU - hemodynamically stable.   Delay start of Pharmacological VTE agent (>24hrs) due to surgical blood loss or risk of bleeding: not applicable

## 2015-01-05 NOTE — Anesthesia Procedure Notes (Signed)
Procedure Name: Intubation Date/Time: 01/05/2015 9:04 AM Performed by: Johnathan Hausen A Pre-anesthesia Checklist: Patient identified, Emergency Drugs available, Suction available, Patient being monitored and Timeout performed Patient Re-evaluated:Patient Re-evaluated prior to inductionOxygen Delivery Method: Circle system utilized Preoxygenation: Pre-oxygenation with 100% oxygen Intubation Type: Combination inhalational/ intravenous induction Ventilation: Mask ventilation without difficulty and Nasal airway inserted- appropriate to patient size Laryngoscope Size: Robertshaw and 4 Grade View: Grade II Nasal Tubes: Nasal prep performed, Nasal Rae and Magill forceps - small, utilized Tube size: 7.5 mm Number of attempts: 1 Placement Confirmation: ETT inserted through vocal cords under direct vision,  positive ETCO2 and breath sounds checked- equal and bilateral Secured at: 27 cm Tube secured with: Tape Dental Injury: Teeth and Oropharynx as per pre-operative assessment

## 2015-01-05 NOTE — Progress Notes (Signed)
Pt's pain more tolerable.  Attempted to give PO medicine to have less reliance on IV dilaudid. Pt was unable to tolerate swallowing PO medicine or juice. Pt is now refusing anything by mouth.  All medications, liquids, and food.  DDS notified.  Awaiting call back.  Roselind Rily

## 2015-01-06 ENCOUNTER — Encounter (HOSPITAL_COMMUNITY): Payer: Self-pay | Admitting: Oral Surgery

## 2015-01-06 DIAGNOSIS — K118 Other diseases of salivary glands: Secondary | ICD-10-CM | POA: Diagnosis not present

## 2015-01-06 LAB — CBC
HCT: 33.8 % — ABNORMAL LOW (ref 36.0–46.0)
Hemoglobin: 10 g/dL — ABNORMAL LOW (ref 12.0–15.0)
MCH: 27.1 pg (ref 26.0–34.0)
MCHC: 29.6 g/dL — ABNORMAL LOW (ref 30.0–36.0)
MCV: 91.6 fL (ref 78.0–100.0)
Platelets: 233 10*3/uL (ref 150–400)
RBC: 3.69 MIL/uL — ABNORMAL LOW (ref 3.87–5.11)
RDW: 15 % (ref 11.5–15.5)
WBC: 7.1 10*3/uL (ref 4.0–10.5)

## 2015-01-06 LAB — BASIC METABOLIC PANEL
Anion gap: 7 (ref 5–15)
BUN: 18 mg/dL (ref 6–20)
CO2: 30 mmol/L (ref 22–32)
CREATININE: 0.94 mg/dL (ref 0.44–1.00)
Calcium: 9.1 mg/dL (ref 8.9–10.3)
Chloride: 103 mmol/L (ref 101–111)
GFR, EST NON AFRICAN AMERICAN: 59 mL/min — AB (ref >60)
Glucose, Bld: 132 mg/dL — ABNORMAL HIGH (ref 65–99)
POTASSIUM: 3.9 mmol/L (ref 3.5–5.1)
Sodium: 140 mmol/L (ref 135–145)

## 2015-01-06 LAB — GLUCOSE, CAPILLARY: GLUCOSE-CAPILLARY: 109 mg/dL — AB (ref 65–99)

## 2015-01-06 MED ORDER — POTASSIUM CHLORIDE 20 MEQ/15ML (10%) PO SOLN
20.0000 meq | Freq: Two times a day (BID) | ORAL | Status: DC
  Filled 2015-01-06 (×3): qty 15

## 2015-01-06 MED ORDER — OXYCODONE HCL 5 MG/5ML PO SOLN
5.0000 mg | Freq: Four times a day (QID) | ORAL | Status: DC
  Administered 2015-01-06: 5 mg via ORAL
  Filled 2015-01-06: qty 5

## 2015-01-06 NOTE — Progress Notes (Signed)
[place] form Dr. Saul Fordyce office called for Dr. Buelah Manis to check on status of discharge paperwork. Made aware still needing reconciliation. Also messaged Dr. Buelah Manis through [address] pt recently medicated for nausea. Awaiting return call from office and/or Dr. Buelah Manis.

## 2015-01-06 NOTE — Progress Notes (Signed)
Pharmacist, Gershon Mussel, made aware via phone Dr. Owens Shark verbally ordered to change Percocet/Roxicet tablet dose to liquid.

## 2015-01-06 NOTE — Discharge Instructions (Signed)
HOME CARE INSTRUCTIONS DENTAL PROCEDURES  MEDICATION: Some soreness and discomfort is normal following a dental procedure.  Use of a non-aspirin pain product, like acetaminophen, is recommended.  If pain is not relieved, please call the surgeonwho performed the procedure.  ORAL HYGIENE: Brushing of the teeth should be resumed the day after surgery.  Begin slowly and softly.  In children, brushing should be done by the parent after every meal.  DIET: A balanced diet is very important during the healing process.   Liquids and soft foods are advisable.  Drink clear liquids at first, then progress to other liquids as tolerated.  If teeth were removed, do not use a straw for at least 2 days.  Try to limit between-meal snacks which are high in sugar.  ACTIVITY: Limit to quiet indoor activities for 24 hours following surgery.  RETURN TO SCHOOL OR WORK: You may return to school or work in a day or two, or as indicated by your dentist.  GENERAL EXPECTATIONS:  -Bleeding is to be expected after teeth are removed.  The bleeding should slow down after several hours.  -Stitches may be in place, which will fall out by themselves.  If the child pulls them out, do not be concerned.  CALL YOUR DOCTOR IS THESE OCCUR:  -Temperature is 101 degrees or more.  -Persistent bright red bleeding.  -Severe pain.  Return to the doctor's office today. Call to make an appointment.  Patient Signature:  ________________________________________________________  Nurse's Signature:  ________________________________________________________

## 2015-01-06 NOTE — Progress Notes (Signed)
Assessment unchanged. Pain relieved with po analgesic. Pt and sons x 2 verbalized understanding of discharge instructions through teach back including to follow up today with Dr. Owens Shark in office after dc from hospital. Pt to receive any prescriptions needed for home from MD at office per Dr. Buelah Manis. Discharged via wc to front entrance to meet awaiting vehicle to carry to Dr. Saul Fordyce office. Accompanied by sons x 2 and NT.

## 2015-01-06 NOTE — Progress Notes (Signed)
Dr. Saul Fordyce office staff, Alvino Blood, made aware Dr. Buelah Manis needs to complete discharge reconciliation. Also messaged MD pt needs additional pain med. MD in procedure at office. Awaiting call back.

## 2015-01-06 NOTE — Progress Notes (Signed)
Spoke with Dr. Buelah Manis via phone. Md aware pt's nausea subsided. Md ok with pt receiving next dose of pain med. Dr. Buelah Manis instructed nurse to proceed with dc and send pt and family directly to office.

## 2015-01-18 NOTE — Op Note (Unsigned)
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  TITLE OF PROCEDURE:  Mandibular vestibuloplasty for mouth plasty with excision of the mylohyoid and genioglossus muscle and a split-thickness skin graft.  SURGEON:  Alfred Levins, D.D.S.  ASSISTANT:  Tamela Oddi, DDS.  INDICATIONS FOR PROCEDURE:  This 74 year old female is edentulous and had implant reconstruction of the lower jaw and developed severe prolapse of the sublingual glands and complete loss of the vestibule in the floor of the mouth and on the labial aspect of the mandible preventing any type of prostatic reconstruction.  Several amount of procedures were tried but were not successful in developing enough vestibule for the implant reconstruction.  The patient is unable to chew food or wear any type of mandibular denture since the loss of her teeth.  PROCEDURE AND FINDINGS:  The patient was brought to the operating room and placed in supine position on the operating table.  After satisfactory nasotracheal anesthesia was achieved, the oral cavity was prepped with Betadine scrub, Betadine paint, and the face was prepped with Betadine down to the clavicle, also she was draped with 4 towel split sheet and a head sheet.  Examination at this time confirmed that she had severe prolapse of both the sublingual glands and elevation of the floor of the mouth  and mandibular vestibule on the labial or lingual aspect.  Bilateral inferior alveolar nerve blocks were achieved with 0.5% Marcaine with 1:200,000 epinephrine to help control hemostasis and infiltration into the labial vestibule with 2% Xylocaine with 1:100,000 epinephrine. Using a 15 scalpel blade, an incision was made in the left posterior mandibular vestibule at the junction of the attached and unattached buccal mucosa and carried anterior along the line of the attached and unattached gingiva to the midline.  Then, using a combination of blunt and sharp dissection, a supraperiosteal dissection was carried along the labial aspect of the mandible and the mental nerve and foramen were identified.  The dissection was carried out to approximately 5 mm above the inferior border of the mandible, excising the mentalis muscle attachments in the anterior and buccinator muscle attachments in the posterior.  A similar procedure and dissection was carried out along the left edentulous mandible, thus allowing repositioning of the unattached gingiva down to the inferior border and increasing the denture bearing surface of the labial aspect of the mandible.  Attention was then turned to the lingual aspect of the mandible and reducing the prolapsed sublingual gland with retractors and finger pressure, a similar incision was carried out at the junction of the attached and unattached gingiva on the posterior left mandible anterior to the midline, and the dissection was carried out  supraperiosteal to the mylohyoid muscle and to the genioglossus muscle.  Blunt dissection was carried out in the parasymphysis area to the middle foramen.  Under direct vision, the mylohyoid muscle was excised with sharp dissection at the mylohyoid ridge and allowed to be repositioned inferiorly.  One- third to one-half of the genioglossus muscle was excised in the anterior aspect from the genial tubercle.  A similar  dissection was carried out on the right side with an incision at the attached and unattached gingiva and a combination of blunt and sharp dissection to excise the right mylohyoid muscle and the right genioglossus muscle partially.  This significantly increased the alveolar and cortical bone of the mandible with the periosteum remaining intact over the mandible.  The small amount of attached gingiva on the crest of the alveolar ridge was then sharply dissected down to the periosteum.  This exposed also 3 implants in the anterior mandible that were covered with cover screws.  Then, using a mandibular awl, a 4-0 Vicryl suture was placed through the lingual flap of tissue at the posterior aspect and picked up with the mandibular awl and carried underneath the inferior border of the mandible and then repositioned on the labial aspect of the mandible and then placed through the labial flap.  This was completed in a similar fashion at the posterior portion of the parasymphysis of the flap in the anterior area and the genioglossus muscle area on both sides.  This allowed 6 sutures to pull both the lingual flap and the buckle flap to the inferior border, thus exposing the mandible and reducing the prolapse of the sublingual gland.  After this was completed, a previously constructed mandibular splint that had been lined with a low temperature impression marks was then heated in warm water and placed over the mandible to give a direct impression of the mandible.  The inner surface of the splint was painted with benzoin as an adhesive and the previously taken split-thickness skin graft from the right buttocks area was lined in the mandibular splint to allow it to cover the entire periosteal exposed area of the mandible.  Two additional 3-0 Vicryl sutures were placed in a similar manner from the lingual aspect to the buccal aspect of the mandible to tie the surgical splint that carried the skin  graft into position and to hold the surgical splint end position, so that the patient could not remove the splint.  The oral cavity was thoroughly irrigated, the oropharynx was suctioned dry, and the throat pack was removed.  Addendum:  Prior to starting the oral procedure, the patient was rolled on her left side, and on the left buttocks, a split-thickness skin graft was taken using a [employer] Dermatome set at 15000th of inch, approximately 1.5 inches wide, and approximately 5 inches in length, taking to separate grafts to use to skin graft the mandible.  This area was dressed with Adaptic sterile dressing and covered with an ABD pad and paper tape was used to apply mild compression.  After the surgical procedure was completed, the patient was awakened and taken to the recovery area in stable condition.  She tolerated the surgery and the anesthesia without complications.     Pervis Hocking., D.D.S.     WB/MEDQ  D:  01/17/2015  T:  01/18/2015  Job:  [id number]

## 2015-02-14 NOTE — Discharge Summary (Signed)
Physician Discharge Summary  Patient ID: Cathy Richardson MRN: [id number] DOB/AGE: July 06, 1941 74 y.o.  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 Active Problems:   Prolapsed sublingual gland   Sublingual gland swelling   Discharged Condition: stable  Hospital Course: The patient went to the operating room on 01/05/2015 with Dr. Alfred Levins and myself, Dr. Tamela Oddi, for a hip graft for vestibuloplasty of the mandible.  The patient was admitted for observation and discharged the following day.   Consults: None  Significant Diagnostic Studies: none  Treatments: surgery: Mandibular vestibuloplasty for mouth plasty with excision of the mylohyoid and genioglossus muscle and a split-thickness skin graft.  Discharge Exam: Blood pressure 148/57, pulse 53, temperature 97.7 F (36.5 C), temperature source Axillary, resp. rate 14, height 5\' 3"  (1.6 m), weight 97.07 kg (214 lb), SpO2 94 %. General appearance: alert and cooperative Head: Normocephalic, without obvious abnormality, atraumatic Eyes: conjunctivae/corneas clear. PERRL, EOM's intact. Fundi benign. Ears: normal TM's and external ear canals both ears Nose: Nares normal. Septum midline. Mucosa normal. No drainage or sinus tenderness. Throat: lips, mucosa, and tongue normal; teeth and gums normal and site healing well, mild hypoesthesia at the mandible Extremities: extremities normal, atraumatic, no cyanosis or edema  Disposition: 01-Home or Self Care  Discharge Instructions    Diet - low sodium heart healthy    Complete by:  As directed   Soft diet or pureed diet     Diet - low sodium heart healthy    Complete by:  As directed      Increase activity slowly    Complete by:  As directed      Increase activity slowly     Complete by:  As directed             Medication List    STOP taking these medications        cyclobenzaprine 10 MG tablet  Commonly known as:  FLEXERIL      TAKE these medications        aspirin EC 81 MG tablet  Take 81 mg by mouth daily.     atorvastatin 20 MG tablet  Commonly known as:  LIPITOR  Take 20 mg by mouth daily.     BLINK TEARS OP  Apply 2 drops to eye daily as needed (dry eyes).     calcium carbonate 500 MG chewable tablet  Commonly known as:  TUMS - dosed in mg elemental calcium  Chew 2 tablets by mouth every morning.     chlorhexidine 0.12 % solution  Commonly known as:  PERIDEX  Use as directed 15 mLs in the mouth or throat 2 (two) times daily.     escitalopram 10 MG tablet  Commonly known as:  LEXAPRO  Take 10 mg by mouth daily. Patient takes at 12 noon every day     Fish Oil 1000 MG Caps  Take 1 capsule by mouth daily.     fluticasone 50 MCG/ACT nasal spray  Commonly known as:  FLONASE  Place 2 sprays into both nostrils daily. Patient takes in the pm     furosemide 40 MG tablet  Commonly known as:  LASIX  Take 40 mg by mouth.     metFORMIN 500 MG 24 hr tablet  Commonly known as:  GLUCOPHAGE-XR  Take 500 mg by mouth daily before supper.  methylphenidate 5 MG tablet  Commonly known as:  RITALIN  Take 5 mg by mouth 2 (two) times daily.     MULTIVITAMIN ADULT PO  Take 2 tablets by mouth daily.     ondansetron 4 MG tablet  Commonly known as:  ZOFRAN  Take 4 mg by mouth daily as needed for nausea or vomiting.     pantoprazole 40 MG tablet  Commonly known as:  PROTONIX  Take 40 mg by mouth daily. Patient takes in the pm     polyethylene glycol powder powder  Commonly known as:  GLYCOLAX/MIRALAX  Take 17 g by mouth daily.     potassium chloride 10 MEQ tablet  Commonly known as:  K-DUR  Take 20 mEq by mouth 2 (two) times daily.     silver sulfADIAZINE 1 % cream  Commonly known as:  SILVADENE  Apply 1 application topically 2  (two) times daily as needed (rash).     verapamil 240 MG CR tablet  Commonly known as:  CALAN-SR  Take 240 mg by mouth daily. Patient takes at nite     Vitamin D 2000 UNITS Caps  Take 2,000 Units by mouth daily.           Follow-up Information    Follow up with Myra Rude, DDS. Call today.   Specialty:  Oral Surgery   Why:  For wound re-check   Contact information:   [place], [address] [place] [address] [phone]       Signed: [place],Natausha Jungwirth L 02/14/2015, 8:51 AM

## 2015-11-08 ENCOUNTER — Non-Acute Institutional Stay (SKILLED_NURSING_FACILITY): Payer: Medicare Other | Admitting: Internal Medicine

## 2015-11-08 DIAGNOSIS — K219 Gastro-esophageal reflux disease without esophagitis: Secondary | ICD-10-CM

## 2015-11-08 DIAGNOSIS — E119 Type 2 diabetes mellitus without complications: Secondary | ICD-10-CM | POA: Diagnosis not present

## 2015-11-08 DIAGNOSIS — F32A Depression, unspecified: Secondary | ICD-10-CM

## 2015-11-08 DIAGNOSIS — G894 Chronic pain syndrome: Secondary | ICD-10-CM

## 2015-11-08 DIAGNOSIS — E7800 Pure hypercholesterolemia, unspecified: Secondary | ICD-10-CM | POA: Diagnosis not present

## 2015-11-08 DIAGNOSIS — C184 Malignant neoplasm of transverse colon: Secondary | ICD-10-CM

## 2015-11-08 DIAGNOSIS — E559 Vitamin D deficiency, unspecified: Secondary | ICD-10-CM | POA: Diagnosis not present

## 2015-11-08 DIAGNOSIS — F329 Major depressive disorder, single episode, unspecified: Secondary | ICD-10-CM

## 2015-11-08 DIAGNOSIS — I10 Essential (primary) hypertension: Secondary | ICD-10-CM | POA: Diagnosis not present

## 2015-11-08 DIAGNOSIS — J45909 Unspecified asthma, uncomplicated: Secondary | ICD-10-CM

## 2015-11-08 LAB — BASIC METABOLIC PANEL
BUN: 10 mg/dL (ref 4–21)
Creatinine: 0.8 mg/dL (ref <=1.1)
Glucose: 103 mg/dL
POTASSIUM: 4.2 mmol/L (ref 3.4–5.3)
SODIUM: 138 mmol/L (ref 137–147)

## 2015-11-08 LAB — CBC AND DIFFERENTIAL
HCT: 29 % — AB (ref 36–46)
HEMOGLOBIN: 8.9 g/dL — AB (ref 12.0–16.0)
Platelets: 510 10*3/uL — AB (ref 150–399)
WBC: 5.1 10*3/mL

## 2015-11-12 ENCOUNTER — Encounter: Payer: Self-pay | Admitting: Internal Medicine

## 2015-11-12 DIAGNOSIS — F32A Depression, unspecified: Secondary | ICD-10-CM | POA: Insufficient documentation

## 2015-11-12 DIAGNOSIS — E119 Type 2 diabetes mellitus without complications: Secondary | ICD-10-CM | POA: Insufficient documentation

## 2015-11-12 DIAGNOSIS — C184 Malignant neoplasm of transverse colon: Secondary | ICD-10-CM | POA: Insufficient documentation

## 2015-11-12 DIAGNOSIS — E559 Vitamin D deficiency, unspecified: Secondary | ICD-10-CM | POA: Insufficient documentation

## 2015-11-12 DIAGNOSIS — J45909 Unspecified asthma, uncomplicated: Secondary | ICD-10-CM | POA: Insufficient documentation

## 2015-11-12 DIAGNOSIS — E7800 Pure hypercholesterolemia, unspecified: Secondary | ICD-10-CM | POA: Insufficient documentation

## 2015-11-12 DIAGNOSIS — K219 Gastro-esophageal reflux disease without esophagitis: Secondary | ICD-10-CM | POA: Insufficient documentation

## 2015-11-12 DIAGNOSIS — G894 Chronic pain syndrome: Secondary | ICD-10-CM | POA: Insufficient documentation

## 2015-11-12 DIAGNOSIS — F329 Major depressive disorder, single episode, unspecified: Secondary | ICD-10-CM | POA: Insufficient documentation

## 2015-11-12 DIAGNOSIS — I10 Essential (primary) hypertension: Secondary | ICD-10-CM | POA: Insufficient documentation

## 2015-11-12 NOTE — Assessment & Plan Note (Signed)
S/p distal transverse hemocectomy ; reported doing well; SNF - admitted for OT/PT

## 2015-11-12 NOTE — Assessment & Plan Note (Signed)
SNF - cont prn dulera and albuteral

## 2015-11-12 NOTE — Assessment & Plan Note (Signed)
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

## 2015-11-12 NOTE — Assessment & Plan Note (Signed)
SNF - not statede as uncontrolled;cont glucophage 24 500 mg po daily

## 2015-11-12 NOTE — Assessment & Plan Note (Signed)
SNF - stable, cont verapamil 240 daily and lasix 40 mg

## 2015-11-12 NOTE — Progress Notes (Signed)
MRN: [id number] Name: Cathy Richardson  Sex: female Age: 75 y.o. DOB: 02-21-1941  [place] [name] Facility/Room:109 Level Of Care: SNF Provider: Inocencio Homes D Emergency Contacts: Extended Emergency Contact Information Primary Emergency Contact: [name], [place] [address] [name] of [place] Phone: [phone] Mobile Phone: [phone] Relation: Daughter  Code Status:   Allergies: Gabapentin; Fluoxetine; Naproxen; Reglan; Tizanidine; and Ivp dye  Chief Complaint  Patient presents with  . New Admit To SNF    HPI: Patient is 75 y.o. female who was admitted to [facility] from 3/29-4/7 for a hemicolectomy for adenoCA of transverse colon.Pt did well post -op and was admitted to SNF for OT/PT. While at SNF pt will be foolwed for DM2, tx with glucophage, HLD, tx with lipitor and omega 3 and vitamin D def ,tx with supplements.  Past Medical History  Diagnosis Date  . Diabetes mellitus without complication ([place])   . Hypertension   . High cholesterol   . Cancer [facility])     History of Renal Cancer  . Arthritis   . Peptic ulcer   . Glaucoma     pt denies on preop visit of 01/03/2015   . Family history of adverse reaction to anesthesia     son has problems with nausea   . Heart murmur   . Sleep apnea     cpap- does not know settings   . Asthma   . Depression   . Renal disorder     only one kidney   . Urinary tract infection     hx   . GERD (gastroesophageal reflux disease)   . History of hiatal hernia   . Headache   . Anemia   . Osteoporosis   . Excessive somnolence disorder     Past Surgical History  Procedure Laterality Date  . Nephrectomy    . Cholecystectomy    . Carpule tunnel release Bilateral   . Removal of skin cancer  N/A     Forehead  . Joint replacement      left knee replacement   . Bowel obstruction surgery       x 2   . Skin split graft N/A 01/05/2015    Procedure: MANDIBULAR LINGUAL SPLIT THICKNESS SKIN GRAFT  VESTIVULOPLASTY;  Surgeon: Michaela Corner, DDS;  Location: [facility]S;  Service: Oral Surgery;  Laterality: N/A;      Medication List       This list is accurate as of: 11/08/15 11:59 PM.  Always use your most recent med list.               acetaminophen 500 MG tablet  Commonly known as:  TYLENOL  Take 1,000 mg by mouth.     aspirin EC 81 MG tablet  Take 81 mg by mouth.     atorvastatin 20 MG tablet  Commonly known as:  LIPITOR  Take 20 mg by mouth.     calcium carbonate 500 MG chewable tablet  Commonly known as:  TUMS - dosed in mg elemental calcium  Chew 2 tablets by mouth every morning.     chlorhexidine 0.12 % solution  Commonly known as:  PERIDEX  15 mLs.     cyclobenzaprine 10 MG tablet  Commonly known as:  FLEXERIL  Take 5-10 mg by mouth.     DOCOSAHEXAENOIC ACID PO  Take by mouth.     DULERA 200-5 MCG/ACT Aero  Generic drug:  mometasone-formoterol  Inhale into the lungs.     escitalopram 10 MG tablet  Commonly known as:  LEXAPRO     Fish Oil 1000 MG Caps  Take 1 capsule by mouth daily.     fluticasone 50 MCG/ACT nasal spray  Commonly known as:  FLONASE     furosemide 40 MG tablet  Commonly known as:  LASIX  Take 40 mg by mouth.     lidocaine 5 %  Commonly known as:  [place] onto the skin.     metFORMIN 500 MG 24 hr tablet  Commonly known as:  GLUCOPHAGE-XR  Take 500 mg by mouth daily before supper.     methadone 10 MG tablet  Commonly known as:  DOLOPHINE  Take 5 mg by mouth.     methylphenidate 10 MG tablet  Commonly known as:  RITALIN  Take 10 mg by mouth.     multivitamin tablet  Take by mouth.     ondansetron 4 MG tablet  Commonly known as:  ZOFRAN  Take 4 mg by mouth.     Oxycodone HCl 20 MG Tabs  Take 20 mg by mouth.     pantoprazole 40 MG tablet  Commonly known as:  PROTONIX  Take 40 mg by mouth.     polyethylene glycol packet  Commonly known as:  MIRALAX / GLYCOLAX  Take 17 g by mouth.     potassium chloride  10 MEQ tablet  Commonly known as:  K-DUR,KLOR-CON     PROVENTIL HFA 108 (90 Base) MCG/ACT inhaler  Generic drug:  albuterol     silver sulfADIAZINE 1 % cream  Commonly known as:  SILVADENE  Apply 1 application topically 2 (two) times daily as needed (rash).     verapamil 240 MG CR tablet  Commonly known as:  CALAN-SR  Take 240 mg by mouth.     VITAMIN D3 SUPER STRENGTH 2000 units Tabs  Generic drug:  Cholecalciferol  Take by mouth.        Meds ordered this encounter  Medications  . acetaminophen (TYLENOL) 500 MG tablet    Sig: Take 1,000 mg by mouth.  . methadone (DOLOPHINE) 10 MG tablet    Sig: Take 5 mg by mouth.  . lidocaine (LIDODERM) 5 %    Sig: Place onto the skin.  . mometasone-formoterol (DULERA) 200-5 MCG/ACT AERO    Sig: Inhale into the lungs.  . Multiple Vitamin (MULTIVITAMIN) tablet    Sig: Take by mouth.  . DOCOSAHEXAENOIC ACID PO    Sig: Take by mouth.  . Oxycodone HCl 20 MG TABS    Sig: Take 20 mg by mouth.  . potassium chloride (K-DUR,KLOR-CON) 10 MEQ tablet    Sig:   . albuterol (PROVENTIL HFA) 108 (90 Base) MCG/ACT inhaler    Sig:   . atorvastatin (LIPITOR) 20 MG tablet    Sig: Take 20 mg by mouth.  [place] Kitchen aspirin EC 81 MG tablet    Sig: Take 81 mg by mouth.  . chlorhexidine (PERIDEX) 0.12 % solution    Sig: 15 mLs.  . Cholecalciferol (VITAMIN D3 SUPER STRENGTH) 2000 units TABS    Sig: Take by mouth.  . escitalopram (LEXAPRO) 10 MG tablet    Sig:   . fluticasone (FLONASE) 50 MCG/ACT nasal spray    Sig:   . furosemide (LASIX) 40 MG tablet    Sig: Take 40 mg by mouth.  . DISCONTD: metFORMIN (GLUCOPHAGE-XR) 500 MG 24 hr tablet    Sig:  Take 500 mg by mouth.  . methylphenidate (RITALIN) 10 MG tablet    Sig: Take 10 mg by mouth.  . ondansetron (ZOFRAN) 4 MG tablet    Sig: Take 4 mg by mouth.  . pantoprazole (PROTONIX) 40 MG tablet    Sig: Take 40 mg by mouth.  . polyethylene glycol (MIRALAX / GLYCOLAX) packet    Sig: Take 17 g by mouth.  .  DISCONTD: silver sulfADIAZINE (SILVADENE) 1 % cream    Sig: Apply topically.  . verapamil (CALAN-SR) 240 MG CR tablet    Sig: Take 240 mg by mouth.  . cyclobenzaprine (FLEXERIL) 10 MG tablet    Sig: Take 5-10 mg by mouth.     There is no immunization history on file for this patient.  Social History  Substance Use Topics  . Smoking status: Never Smoker   . Smokeless tobacco: Never Used  . Alcohol Use: No    Family history is +HTN,DM  Review of Systems  DATA OBTAINED: from patient, nurse GENERAL:  no fevers, fatigue, appetite changes SKIN: No itching, rash or wounds EYES: No eye pain, redness, discharge EARS: No earache, tinnitus, change in hearing NOSE: No congestion, drainage or bleeding  MOUTH/THROAT: No mouth or tooth pain, No sore throat RESPIRATORY: No cough, wheezing, SOB CARDIAC: No chest pain, palpitations, lower extremity edema  GI: No abdominal pain, No N/V/D or constipation, No heartburn or reflux  GU: No dysuria, frequency or urgency, or incontinence  MUSCULOSKELETAL: No unrelieved bone/joint pain; pt is very interested in her pain meds-she understands she is habituated NEUROLOGIC: No headache, dizziness or focal weakness PSYCHIATRIC: No c/o anxiety or sadness   Filed Vitals:   11/12/15 2020  BP: 113/58  Pulse: 48  Temp: 98.8 F (37.1 C)  Resp: 16    SpO2 Readings from Last 1 Encounters:  01/06/15 94%        Physical Exam  GENERAL APPEARANCE: Alert, conversant, obese WF   No acute distress.  SKIN: No diaphoresis rash HEAD: Normocephalic, atraumatic  EYES: Conjunctiva/lids clear. Pupils round, reactive. EOMs intact.  EARS: External exam WNL, canals clear. Hearing grossly normal.  NOSE: No deformity or discharge.  MOUTH/THROAT: Lips w/o lesions  RESPIRATORY: Breathing is even, unlabored. Lung sounds are clear   CARDIOVASCULAR: Heart RRR no murmurs, rubs or gallops. No peripheral edema.   GASTROINTESTINAL: Abdomen is soft, non-tender, not  distended w/ normal bowel sounds; dressing in place GENITOURINARY: Bladder non tender, not distended  MUSCULOSKELETAL: No abnormal joints or musculature NEUROLOGIC:  Cranial nerves 2-12 grossly intact. Moves all extremities  PSYCHIATRIC: Mood and affect appropriate to situation, no behavioral issues  Patient Active Problem List   Diagnosis Date Noted  . Adenocarcinoma of transverse colon ([place]) 11/12/2015  . Hypertension 11/12/2015  . GERD (gastroesophageal reflux disease) 11/12/2015  . Diabetes mellitus without complication ([place]) [id number]  . Vitamin D deficiency 11/12/2015  . High cholesterol 11/12/2015  . Depression 11/12/2015  . Chronic pain syndrome 11/12/2015  . Asthma 11/12/2015  . Sublingual gland swelling 01/05/2015  . Prolapsed sublingual gland 12/30/2014    CBC    Component Value Date/Time   WBC 7.1 01/06/2015 0553   RBC 3.69* 01/06/2015 0553   HGB 10.0* 01/06/2015 0553   HCT 33.8* 01/06/2015 0553   PLT 233 01/06/2015 0553   MCV 91.6 01/06/2015 0553   LYMPHSABS 1.5 05/23/2013 1905   MONOABS 0.3 05/23/2013 1905   EOSABS 0.1 05/23/2013 1905   BASOSABS 0.0 05/23/2013 1905    CMP  Component Value Date/Time   NA 140 01/06/2015 0553   K 3.9 01/06/2015 0553   CL 103 01/06/2015 0553   CO2 30 01/06/2015 0553   GLUCOSE 132* 01/06/2015 0553   BUN 18 01/06/2015 0553   CREATININE 0.94 01/06/2015 0553   CALCIUM 9.1 01/06/2015 0553   GFRNONAA 59* 01/06/2015 0553   GFRAA >60 01/06/2015 0553    No results found for: HGBA1C   No results found.  Not all labs, radiology exams or other studies done during hospitalization come through on my EPIC note; however they are reviewed by me.    Assessment and Plan  Adenocarcinoma of transverse colon ([place]) S/p distal transverse hemocectomy ; reported doing well; SNF - admitted for OT/PT  Hypertension SNF - stable, cont verapamil 240 daily and lasix 40 mg  GERD (gastroesophageal reflux disease) SNF - cont protonix 40  mg daily  Diabetes mellitus without complication ([facility]) SNF - not statede as uncontrolled;cont glucophage 24 500 mg po daily  Vitamin D deficiency SNF - not stated as uncontrolled ;cont replacement 2000u daily  High cholesterol SNF - not stated as uncontrolled; cont lipitor 20 mg and omega 3   Depression SNF - not stated as uncontrolled ;cont lexapro 10 mg daily  Chronic pain syndrome SNF - stable on methadone 5 mg daily  Asthma SNF - cont prn dulera and albuteral   Time spent > 45 min;> 50% of time with patient was spent reviewing records, labs, tests and studies, counseling and developing plan of care  Hennie Duos, MD

## 2015-11-12 NOTE — Assessment & Plan Note (Signed)
SNF - not stated as uncontrolled ;cont replacement 2000u daily

## 2015-11-12 NOTE — Assessment & Plan Note (Signed)
SNF - cont protonix 40 mg daily 

## 2015-11-12 NOTE — Assessment & Plan Note (Signed)
SNF - not stated as uncontrolled ;cont lexapro 10 mg daily

## 2015-11-12 NOTE — Assessment & Plan Note (Addendum)
SNF - not stated as uncontrolled; cont lipitor 20 mg and omega 3

## 2015-11-23 ENCOUNTER — Encounter: Payer: Self-pay | Admitting: Internal Medicine

## 2015-11-23 ENCOUNTER — Non-Acute Institutional Stay (SKILLED_NURSING_FACILITY): Payer: Medicare Other | Admitting: Internal Medicine

## 2015-11-23 DIAGNOSIS — E119 Type 2 diabetes mellitus without complications: Secondary | ICD-10-CM | POA: Diagnosis not present

## 2015-11-23 DIAGNOSIS — E7800 Pure hypercholesterolemia, unspecified: Secondary | ICD-10-CM

## 2015-11-23 DIAGNOSIS — E559 Vitamin D deficiency, unspecified: Secondary | ICD-10-CM

## 2015-11-23 DIAGNOSIS — I10 Essential (primary) hypertension: Secondary | ICD-10-CM | POA: Diagnosis not present

## 2015-11-23 DIAGNOSIS — G894 Chronic pain syndrome: Secondary | ICD-10-CM | POA: Diagnosis not present

## 2015-11-23 DIAGNOSIS — J45909 Unspecified asthma, uncomplicated: Secondary | ICD-10-CM

## 2015-11-23 DIAGNOSIS — C184 Malignant neoplasm of transverse colon: Secondary | ICD-10-CM

## 2015-11-23 DIAGNOSIS — F329 Major depressive disorder, single episode, unspecified: Secondary | ICD-10-CM | POA: Diagnosis not present

## 2015-11-23 DIAGNOSIS — F32A Depression, unspecified: Secondary | ICD-10-CM

## 2015-11-23 DIAGNOSIS — K219 Gastro-esophageal reflux disease without esophagitis: Secondary | ICD-10-CM | POA: Diagnosis not present

## 2015-11-23 NOTE — Progress Notes (Signed)
MRN: [id number] Name: Cathy Richardson  Sex: female Age: 75 y.o. DOB: 1941-01-02  [place] #: [facility] Facility/Room:109 Level Of Care: SNF Provider: Inocencio Homes MD Emergency Contacts: Extended Emergency Contact Information Primary Emergency Contact: [name], [place] [address] [name] of [place] Phone: [phone] Mobile Phone: [phone] Relation: Daughter  Code Status: FullCode  Allergies: Gabapentin; Fluoxetine; Naproxen; Reglan; Tizanidine; and Ivp dye  Chief Complaint  Patient presents with  . Discharge Note    HPI: Patient is 75 y.o. female who was admitted to [facility] from 3/29-4/7 for a hemicolectomy for adenoCA of transverse colon.Pt did well post -op and was admitted to SNF for OT/PT. Pt is now ready to be d/c to home.  Past Medical History  Diagnosis Date  . Diabetes mellitus without complication ([place])   . Hypertension   . High cholesterol   . Cancer [facility])     History of Renal Cancer  . Arthritis   . Peptic ulcer   . Glaucoma     pt denies on preop visit of 01/03/2015   . Family history of adverse reaction to anesthesia     son has problems with nausea   . Heart murmur   . Sleep apnea     cpap- does not know settings   . Asthma   . Depression   . Renal disorder     only one kidney   . Urinary tract infection     hx   . GERD (gastroesophageal reflux disease)   . History of hiatal hernia   . Headache   . Anemia   . Osteoporosis   . Excessive somnolence disorder     Past Surgical History  Procedure Laterality Date  . Nephrectomy    . Cholecystectomy    . Carpule tunnel release Bilateral   . Removal of skin cancer  N/A     Forehead  . Joint replacement      left knee replacement   . Bowel obstruction surgery       x 2   . Skin split graft N/A 01/05/2015    Procedure: MANDIBULAR LINGUAL SPLIT THICKNESS SKIN GRAFT VESTIVULOPLASTY;  Surgeon: Michaela Corner, DDS;  Location: [facility] ORS;  Service: Oral Surgery;   Laterality: N/A;      Medication List       This list is accurate as of: 11/23/15 11:53 AM.  Always use your most recent med list.               acetaminophen 500 MG tablet  Commonly known as:  TYLENOL  Take 1,000 mg by mouth every 6 (six) hours as needed.     aspirin EC 81 MG tablet  Take 81 mg by mouth daily.     atorvastatin 20 MG tablet  Commonly known as:  LIPITOR  Take 20 mg by mouth daily.     calcium carbonate 500 MG chewable tablet  Commonly known as:  TUMS - dosed in mg elemental calcium  Chew 2 tablets by mouth every morning.     chlorhexidine 0.12 % solution  Commonly known as:  PERIDEX  15 mLs.     cyclobenzaprine 10 MG tablet  Commonly known as:  FLEXERIL  Take 5-10 mg by mouth.     DULERA 200-5 MCG/ACT Aero  Generic drug:  mometasone-formoterol  Inhale 2 puffs into the lungs 2 (two) times daily as needed.     escitalopram 10 MG tablet  Commonly known as:  LEXAPRO  10 mg daily.     Fish Oil 1000 MG Caps  Take 1 capsule by mouth daily.     fluticasone 50 MCG/ACT nasal spray  Commonly known as:  FLONASE     furosemide 40 MG tablet  Commonly known as:  LASIX  Take 40 mg by mouth daily.     lidocaine 5 %  Commonly known as:  [place] onto the skin.     metFORMIN 500 MG 24 hr tablet  Commonly known as:  GLUCOPHAGE-XR  Take 500 mg by mouth daily before supper.     methadone 10 MG tablet  Commonly known as:  DOLOPHINE  Take 5 mg by mouth at bedtime.     methylphenidate 10 MG tablet  Commonly known as:  RITALIN  Take 10 mg by mouth 2 (two) times daily with breakfast and lunch.     multivitamin tablet  Take by mouth.     ondansetron 4 MG tablet  Commonly known as:  ZOFRAN  Take 4 mg by mouth every 6 (six) hours as needed.     Oxycodone HCl 20 MG Tabs  Take 20 mg by mouth every 4 (four) hours as needed.     pantoprazole 40 MG tablet  Commonly known as:  PROTONIX  Take 40 mg by mouth.     polyethylene glycol packet   Commonly known as:  MIRALAX / GLYCOLAX  Take 17 g by mouth.     potassium chloride 10 MEQ tablet  Commonly known as:  K-DUR,KLOR-CON  20 mEq 2 (two) times daily.     PROVENTIL HFA 108 (90 Base) MCG/ACT inhaler  Generic drug:  albuterol  2 puffs every 6 (six) hours as needed.     silver sulfADIAZINE 1 % cream  Commonly known as:  SILVADENE  Apply 1 application topically 2 (two) times daily as needed (rash).     verapamil 240 MG CR tablet  Commonly known as:  CALAN-SR  Take 240 mg by mouth daily.     VITAMIN D3 SUPER STRENGTH 2000 units Tabs  Generic drug:  Cholecalciferol  Take 2,000 Units by mouth daily.        No orders of the defined types were placed in this encounter.    Immunization History  Administered Date(s) Administered  . PPD Test 10/30/2010    Social History  Substance Use Topics  . Smoking status: Never Smoker   . Smokeless tobacco: Never Used  . Alcohol Use: No    Filed Vitals:   11/23/15 1011  BP: 145/73  Pulse: 62  Temp: 97.5 F (36.4 C)  Resp: 20    Physical Exam  GENERAL APPEARANCE: Alert, conversant. No acute distress.  HEENT: Unremarkable. RESPIRATORY: Breathing is even, unlabored. Lung sounds are clear   CARDIOVASCULAR: Heart RRR no murmurs, rubs or gallops. No peripheral edema.  GASTROINTESTINAL: Abdomen is soft, non-tender, not distended w/ normal bowel sounds.  NEUROLOGIC: Cranial nerves 2-12 grossly intact. Moves all extremities  Patient Active Problem List   Diagnosis Date Noted  . Adenocarcinoma of transverse colon ([place]) 11/12/2015  . Hypertension 11/12/2015  . GERD (gastroesophageal reflux disease) 11/12/2015  . Diabetes mellitus without complication ([place]) [id number]  . Vitamin D deficiency 11/12/2015  . High cholesterol 11/12/2015  . Depression 11/12/2015  . Chronic pain syndrome 11/12/2015  . Asthma 11/12/2015  . Sublingual gland swelling 01/05/2015  . Prolapsed sublingual gland 12/30/2014    CBC    Component  Value Date/Time  WBC 5.1 11/08/2015   WBC 7.1 01/06/2015 0553   RBC 3.69* 01/06/2015 0553   HGB 8.9* 11/08/2015   HCT 29* 11/08/2015   PLT 510* 11/08/2015   MCV 91.6 01/06/2015 0553   LYMPHSABS 1.5 05/23/2013 1905   MONOABS 0.3 05/23/2013 1905   EOSABS 0.1 05/23/2013 1905   BASOSABS 0.0 05/23/2013 1905    CMP     Component Value Date/Time   NA 138 11/08/2015   NA 140 01/06/2015 0553   K 4.2 11/08/2015   CL 103 01/06/2015 0553   CO2 30 01/06/2015 0553   GLUCOSE 132* 01/06/2015 0553   BUN 10 11/08/2015   BUN 18 01/06/2015 0553   CREATININE 0.8 11/08/2015   CREATININE 0.94 01/06/2015 0553   CALCIUM 9.1 01/06/2015 0553   GFRNONAA 59* 01/06/2015 0553   GFRAA >60 01/06/2015 0553    Assessment and Plan  Pt is discharged to home with HH/OT/PT/nursing. Medications have been reconciled and Rx's written.   Time spent > 30 min;> 50% of time with patient was spent reviewing records, labs, tests and studies, counseling and developing plan of care  Inocencio Homes  MD

## 2015-11-23 NOTE — Progress Notes (Deleted)
MRN: [id number] Name: Cathy Richardson  Sex: female Age: 75 y.o. DOB: 1941/02/05  [place] #:  Facility/Room: Level Of Care: SNF Provider: Inocencio Homes MD Emergency Contacts: Extended Emergency Contact Information Primary Emergency Contact: [name], [place] [address] [name] of [place] Phone: [phone] Mobile Phone: [phone] Relation: Daughter  Code Status: FullCode  Allergies: Gabapentin; Fluoxetine; Naproxen; Reglan; Tizanidine; and Ivp dye  Chief Complaint  Patient presents with  . Discharge Note    HPI: Patient is 75 y.o. female who  Past Medical History  Diagnosis Date  . Diabetes mellitus without complication ([place])   . Hypertension   . High cholesterol   . Cancer [facility])     History of Renal Cancer  . Arthritis   . Peptic ulcer   . Glaucoma     pt denies on preop visit of 01/03/2015   . Family history of adverse reaction to anesthesia     son has problems with nausea   . Heart murmur   . Sleep apnea     cpap- does not know settings   . Asthma   . Depression   . Renal disorder     only one kidney   . Urinary tract infection     hx   . GERD (gastroesophageal reflux disease)   . History of hiatal hernia   . Headache   . Anemia   . Osteoporosis   . Excessive somnolence disorder     Past Surgical History  Procedure Laterality Date  . Nephrectomy    . Cholecystectomy    . Carpule tunnel release Bilateral   . Removal of skin cancer  N/A     Forehead  . Joint replacement      left knee replacement   . Bowel obstruction surgery       x 2   . Skin split graft N/A 01/05/2015    Procedure: MANDIBULAR LINGUAL SPLIT THICKNESS SKIN GRAFT VESTIVULOPLASTY;  Surgeon: Michaela Corner, DDS;  Location: [facility];  Service: Oral Surgery;  Laterality: N/A;      Medication List       This list is accurate as of: 11/23/15  9:49 AM.  Always use your most recent med list.               acetaminophen 500 MG tablet  Commonly known as:   TYLENOL  Take 1,000 mg by mouth every 6 (six) hours as needed.     aspirin EC 81 MG tablet  Take 81 mg by mouth daily.     atorvastatin 20 MG tablet  Commonly known as:  LIPITOR  Take 20 mg by mouth daily.     calcium carbonate 500 MG chewable tablet  Commonly known as:  TUMS - dosed in mg elemental calcium  Chew 2 tablets by mouth every morning.     chlorhexidine 0.12 % solution  Commonly known as:  PERIDEX  15 mLs.     cyclobenzaprine 10 MG tablet  Commonly known as:  FLEXERIL  Take 5-10 mg by mouth.     DULERA 200-5 MCG/ACT Aero  Generic drug:  mometasone-formoterol  Inhale 2 puffs into the lungs 2 (two) times daily as needed.     escitalopram 10 MG tablet  Commonly known as:  LEXAPRO  10 mg daily.     Fish Oil 1000 MG Caps  Take 1 capsule by mouth daily.     fluticasone 50 MCG/ACT nasal spray  Commonly known as:  FLONASE     furosemide 40 MG tablet  Commonly known as:  LASIX  Take 40 mg by mouth daily.     lidocaine 5 %  Commonly known as:  [place] onto the skin.     metFORMIN 500 MG 24 hr tablet  Commonly known as:  GLUCOPHAGE-XR  Take 500 mg by mouth daily before supper.     methadone 10 MG tablet  Commonly known as:  DOLOPHINE  Take 5 mg by mouth at bedtime.     methylphenidate 10 MG tablet  Commonly known as:  RITALIN  Take 10 mg by mouth 2 (two) times daily with breakfast and lunch.     multivitamin tablet  Take by mouth.     ondansetron 4 MG tablet  Commonly known as:  ZOFRAN  Take 4 mg by mouth every 6 (six) hours as needed.     Oxycodone HCl 20 MG Tabs  Take 20 mg by mouth every 4 (four) hours as needed.     pantoprazole 40 MG tablet  Commonly known as:  PROTONIX  Take 40 mg by mouth.     polyethylene glycol packet  Commonly known as:  MIRALAX / GLYCOLAX  Take 17 g by mouth.     potassium chloride 10 MEQ tablet  Commonly known as:  K-DUR,KLOR-CON  20 mEq 2 (two) times daily.     PROVENTIL HFA 108 (90 Base) MCG/ACT  inhaler  Generic drug:  albuterol  2 puffs every 6 (six) hours as needed.     silver sulfADIAZINE 1 % cream  Commonly known as:  SILVADENE  Apply 1 application topically 2 (two) times daily as needed (rash).     verapamil 240 MG CR tablet  Commonly known as:  CALAN-SR  Take 240 mg by mouth daily.     VITAMIN D3 SUPER STRENGTH 2000 units Tabs  Generic drug:  Cholecalciferol  Take 2,000 Units by mouth daily.        No orders of the defined types were placed in this encounter.    Immunization History  Administered Date(s) Administered  . PPD Test 10/30/2010    Social History  Substance Use Topics  . Smoking status: Never Smoker   . Smokeless tobacco: Never Used  . Alcohol Use: No    There were no vitals filed for this visit.  Physical Exam  GENERAL APPEARANCE: Alert, conversant. No acute distress.  HEENT: Unremarkable. RESPIRATORY: Breathing is even, unlabored. Lung sounds are clear   CARDIOVASCULAR: Heart RRR no murmurs, rubs or gallops. No peripheral edema.  GASTROINTESTINAL: Abdomen is soft, non-tender, not distended w/ normal bowel sounds.  NEUROLOGIC: Cranial nerves 2-12 grossly intact. Moves all extremities  Patient Active Problem List   Diagnosis Date Noted  . Adenocarcinoma of transverse colon ([place]) 11/12/2015  . Hypertension 11/12/2015  . GERD (gastroesophageal reflux disease) 11/12/2015  . Diabetes mellitus without complication ([place]) [id number]  . Vitamin D deficiency 11/12/2015  . High cholesterol 11/12/2015  . Depression 11/12/2015  . Chronic pain syndrome 11/12/2015  . Asthma 11/12/2015  . Sublingual gland swelling 01/05/2015  . Prolapsed sublingual gland 12/30/2014    CBC    Component Value Date/Time   WBC 5.1 11/08/2015   WBC 7.1 01/06/2015 0553   RBC 3.69* 01/06/2015 0553   HGB 8.9* 11/08/2015   HCT 29* 11/08/2015   PLT 510* 11/08/2015   MCV 91.6 01/06/2015 0553   LYMPHSABS 1.5 05/23/2013 1905   MONOABS 0.3 05/23/2013 1905  EOSABS  0.1 05/23/2013 1905   BASOSABS 0.0 05/23/2013 1905    CMP     Component Value Date/Time   NA 138 11/08/2015   NA 140 01/06/2015 0553   K 4.2 11/08/2015   CL 103 01/06/2015 0553   CO2 30 01/06/2015 0553   GLUCOSE 132* 01/06/2015 0553   BUN 10 11/08/2015   BUN 18 01/06/2015 0553   CREATININE 0.8 11/08/2015   CREATININE 0.94 01/06/2015 0553   CALCIUM 9.1 01/06/2015 0553   GFRNONAA 59* 01/06/2015 0553   GFRAA >60 01/06/2015 0553    Assessment and Plan  No problem-specific assessment & plan notes found for this encounter.    Inocencio Homes  MD

## 2015-12-13 DIAGNOSIS — Z85038 Personal history of other malignant neoplasm of large intestine: Secondary | ICD-10-CM | POA: Insufficient documentation

## 2016-12-16 ENCOUNTER — Inpatient Hospital Stay (HOSPITAL_COMMUNITY)
Admission: AD | Admit: 2016-12-16 | Discharge: 2016-12-17 | Disposition: A | Discharge status: Home / Self Care | Payer: Medicare Other | Source: Ambulatory Visit | Attending: Obstetrics & Gynecology | Admitting: Obstetrics & Gynecology

## 2016-12-16 ENCOUNTER — Encounter (HOSPITAL_COMMUNITY): Payer: Self-pay | Admitting: *Deleted

## 2016-12-16 ENCOUNTER — Inpatient Hospital Stay (HOSPITAL_COMMUNITY): Payer: Medicare Other

## 2016-12-16 DIAGNOSIS — R143 Flatulence: Secondary | ICD-10-CM | POA: Diagnosis not present

## 2016-12-16 DIAGNOSIS — Z8719 Personal history of other diseases of the digestive system: Secondary | ICD-10-CM | POA: Diagnosis not present

## 2016-12-16 DIAGNOSIS — R188 Other ascites: Secondary | ICD-10-CM | POA: Insufficient documentation

## 2016-12-16 DIAGNOSIS — I700 Atherosclerosis of aorta: Secondary | ICD-10-CM | POA: Insufficient documentation

## 2016-12-16 DIAGNOSIS — Z85038 Personal history of other malignant neoplasm of large intestine: Secondary | ICD-10-CM | POA: Diagnosis present

## 2016-12-16 DIAGNOSIS — R109 Unspecified abdominal pain: Secondary | ICD-10-CM | POA: Insufficient documentation

## 2016-12-16 DIAGNOSIS — K529 Noninfective gastroenteritis and colitis, unspecified: Secondary | ICD-10-CM | POA: Insufficient documentation

## 2016-12-16 DIAGNOSIS — Z9049 Acquired absence of other specified parts of digestive tract: Secondary | ICD-10-CM | POA: Insufficient documentation

## 2016-12-16 LAB — URINALYSIS, ROUTINE W REFLEX MICROSCOPIC
BILIRUBIN URINE: NEGATIVE
Bacteria, UA: NONE SEEN
Glucose, UA: NEGATIVE mg/dL
HGB URINE DIPSTICK: NEGATIVE
KETONES UR: NEGATIVE mg/dL
NITRITE: NEGATIVE
Protein, ur: NEGATIVE mg/dL
RBC / HPF: NONE SEEN RBC/hpf (ref 0–5)
SPECIFIC GRAVITY, URINE: 1.019 (ref 1.005–1.030)
pH: 5 (ref 5.0–8.0)

## 2016-12-16 LAB — CBC WITH DIFFERENTIAL/PLATELET
BASOS ABS: 0 10*3/uL (ref 0.0–0.1)
BASOS PCT: 0 %
Eosinophils Absolute: 0.1 10*3/uL (ref 0.0–0.7)
Eosinophils Relative: 1 %
HCT: 41 % (ref 36.0–46.0)
Hemoglobin: 13.1 g/dL (ref 12.0–15.0)
Lymphocytes Relative: 31 %
Lymphs Abs: 2.4 10*3/uL (ref 0.7–4.0)
MCH: 29 pg (ref 26.0–34.0)
MCHC: 32 g/dL (ref 30.0–36.0)
MCV: 90.9 fL (ref 78.0–100.0)
MONO ABS: 0.3 10*3/uL (ref 0.1–1.0)
Monocytes Relative: 3 %
NEUTROS PCT: 65 %
Neutro Abs: 5 10*3/uL (ref 1.7–7.7)
PLATELETS: 354 10*3/uL (ref 150–400)
RBC: 4.51 MIL/uL (ref 3.87–5.11)
RDW: 14.8 % (ref 11.5–15.5)
WBC: 7.7 10*3/uL (ref 4.0–10.5)

## 2016-12-16 LAB — COMPREHENSIVE METABOLIC PANEL
ALBUMIN: 3.4 g/dL — AB (ref 3.5–5.0)
ALT: 15 U/L (ref 14–54)
AST: 22 U/L (ref 15–41)
Alkaline Phosphatase: 144 U/L — ABNORMAL HIGH (ref 38–126)
Anion gap: 9 (ref 5–15)
BUN: 16 mg/dL (ref 6–20)
CHLORIDE: 105 mmol/L (ref 101–111)
CO2: 24 mmol/L (ref 22–32)
Calcium: 9.5 mg/dL (ref 8.9–10.3)
Creatinine, Ser: 1.2 mg/dL — ABNORMAL HIGH (ref 0.44–1.00)
GFR calc Af Amer: 50 mL/min — ABNORMAL LOW (ref >60)
GFR calc non Af Amer: 43 mL/min — ABNORMAL LOW (ref >60)
GLUCOSE: 131 mg/dL — AB (ref 65–99)
POTASSIUM: 4 mmol/L (ref 3.5–5.1)
SODIUM: 138 mmol/L (ref 135–145)
Total Bilirubin: 1 mg/dL (ref 0.3–1.2)
Total Protein: 7.1 g/dL (ref 6.5–8.1)

## 2016-12-16 LAB — AMYLASE: Amylase: 139 U/L — ABNORMAL HIGH (ref 28–100)

## 2016-12-16 LAB — LIPASE, BLOOD: LIPASE: 37 U/L (ref 11–51)

## 2016-12-16 MED ORDER — HYDROMORPHONE HCL 1 MG/ML IJ SOLN
1.0000 mg | Freq: Once | INTRAMUSCULAR | Status: AC
  Administered 2016-12-16: 1 mg via INTRAVENOUS
  Filled 2016-12-16: qty 1

## 2016-12-16 NOTE — MAU Note (Signed)
Pt awoke this am with a bad headache due to bp of 175/96 and took her first dose of lisinipril.  She ate a full lunch at [employer] and started with mid abdominal cramping at 1530. She took 2 tramadol at 1730 without any relief. Pt reports this  pain has been consistent since 1530. Has diarrhea daily after meals which is her normal bowel pattern. Denies any vag bleeding, and no dysuria.

## 2016-12-16 NOTE — MAU Provider Note (Signed)
Chief Complaint: Abdominal Pain   First Provider Initiated Contact with Patient 12/16/16 2144     SUBJECTIVE HPI: Cathy Richardson is a 76 y.o. [id number] female who presents to Maternity Admissions reporting severe, intermittent upper abd pain since 1530. Normal appetite and PO intake today. Ate at [facility]. Normally has diarrhea after each meal due to Hx (see below). Had three BMs since lunch today. Consistency Nml for her. No blood in stool.   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 See [facility]. F/U has been Nml since colon resection 2017.  Location: midline above umbilicus Quality: sharp Severity: 10/10 on pain scale Duration: 5 hours Context: Started soon after lunch Timing: intermittent Modifying factors: No improvement w/ Tramadol.  Associated signs and symptoms: Pos for diarrhea (but not much change from Pt's baseline bowel habits). Neg for fever, chills, N/V, decreased appetite, hematochezia, sick contacts. Is tolerating POs.    Past Medical History:  Diagnosis Date  . Anemia   . Arthritis   . Asthma   . Cancer [facility])    History of Renal Cancer  . Depression   . Diabetes mellitus without complication ([place])   . Excessive somnolence disorder   . Family history of adverse reaction to anesthesia    son has problems with nausea   . GERD (gastroesophageal reflux disease)   . Glaucoma    pt denies on preop visit of 01/03/2015   . Headache   . Heart murmur   . High cholesterol   . History of hiatal hernia   . Hypertension   . Osteoporosis   . Peptic ulcer   . Renal disorder    only one kidney   . Sleep apnea    cpap- does not know settings   . Urinary tract infection    hx    OB History  Gravida Para Term Preterm AB Living  7 7 6 1    5   SAB TAB Ectopic Multiple Live Births               # Outcome Date GA Lbr Len/2nd Weight Sex Delivery Anes PTL Lv  7 Preterm           6 Term           5 Term           4 Term           3 Term           2 Term           1 Term              Past Surgical History:  Procedure Laterality Date  . bowel obstruction surgery      x 2   . Carpule Tunnel Release Bilateral   . CHOLECYSTECTOMY    . JOINT REPLACEMENT     left knee replacement   . KNEE ARTHROCENTESIS     left knee  . NEPHRECTOMY    . Removal of Skin Cancer  N/A    Forehead  . SKIN SPLIT GRAFT N/A 01/05/2015   Procedure: MANDIBULAR LINGUAL SPLIT THICKNESS SKIN GRAFT VESTIVULOPLASTY;  Surgeon: Michaela Corner, DDS;  Location: [facility] ORS;  Service: Oral Surgery;  Laterality: N/A;   Social History   Social History  .  Marital status: Widowed    Spouse name: N/A  . Number of children: N/A  . Years of education: N/A   Occupational History  . Not on file.   Social History Main Topics  . Smoking status: Never Smoker  . Smokeless tobacco: Never Used  . Alcohol use No  . Drug use: No  . Sexual activity: Not Currently   Other Topics Concern  . Not on file   Social History Narrative  . No narrative on file   History reviewed. No pertinent family history. No current facility-administered medications on file prior to encounter.    Current Outpatient Prescriptions on File Prior to Encounter  Medication Sig Dispense Refill  . albuterol (PROVENTIL HFA) 108 (90 Base) MCG/ACT inhaler 2 puffs every 6 (six) hours as needed.     Marland Kitchen aspirin EC 81 MG tablet Take 81 mg by mouth daily.     Marland Kitchen atorvastatin (LIPITOR) 20 MG tablet Take 20 mg by mouth daily.     . calcium carbonate (TUMS - DOSED IN MG ELEMENTAL CALCIUM) 500 MG chewable tablet Chew 2 tablets by mouth every morning.     . Cholecalciferol (VITAMIN D3 SUPER STRENGTH) 2000 units TABS Take 2,000 Units by mouth daily.     Marland Kitchen escitalopram (LEXAPRO) 10 MG tablet 10 mg daily.      . fluticasone (FLONASE) 50 MCG/ACT nasal spray Place 2 sprays into both nostrils daily.     . furosemide (LASIX) 40 MG tablet Take 40 mg by mouth daily.     . metFORMIN (GLUCOPHAGE-XR) 500 MG 24 hr tablet Take 500 mg by mouth daily before supper.    . methadone (DOLOPHINE) 10 MG tablet Take 5 mg by mouth at bedtime.     . Multiple Vitamin (MULTIVITAMIN) tablet Take by mouth.    . Omega-3 Fatty Acids (FISH OIL) 1000 MG CAPS Take 1 capsule by mouth daily.    . ondansetron (ZOFRAN) 4 MG tablet Take 4 mg by mouth every 6 (six) hours as needed.     . pantoprazole (PROTONIX) 40 MG tablet Take 40 mg by mouth.    . potassium chloride (K-DUR,KLOR-CON) 10 MEQ tablet 20 mEq 2 (two) times daily.     . silver sulfADIAZINE (SILVADENE) 1 % cream Apply 1 application topically 2 (two) times daily as needed (rash).     Marland Kitchen acetaminophen (TYLENOL) 500 MG tablet Take 1,000 mg by mouth every 6 (six) hours as needed.     . chlorhexidine (PERIDEX) 0.12 % solution 15 mLs.    . cyclobenzaprine (FLEXERIL) 10 MG tablet Take 10 mg by mouth daily as needed.     . lidocaine (LIDODERM) 5 % Place onto the skin.    . methylphenidate (RITALIN) 10 MG tablet Take 10 mg by mouth 2 (two) times daily with breakfast and lunch.     . mometasone-formoterol (DULERA) 200-5 MCG/ACT AERO Inhale 2 puffs into the lungs 2 (two) times daily as needed.     . Oxycodone HCl 20 MG TABS Take 20 mg by mouth every 4 (four) hours as needed.     . polyethylene glycol (MIRALAX / GLYCOLAX) packet Take 17 g by mouth.    . verapamil (CALAN-SR) 240 MG CR tablet Take 240 mg by mouth daily.      Allergies  Allergen Reactions  . Gabapentin Shortness Of Breath  . Fluoxetine     Other reaction(s): Other (See Comments) Severe headache  . Naproxen Itching  . Reglan [Metoclopramide] Other (See Comments)  dizzy  . Tizanidine Other (See Comments)    Confusion   . Ivp Dye [Iodinated Diagnostic Agents] Rash    I have reviewed patient's Past Medical Hx,  Surgical Hx, Family Hx, Social Hx, medications and allergies.   Review of Systems  Constitutional: Negative for appetite change, chills and fever.  Respiratory: Negative for shortness of breath.   Cardiovascular: Negative for chest pain.  Gastrointestinal: Positive for abdominal pain and diarrhea. Negative for abdominal distention, blood in stool, constipation, nausea and vomiting.  Genitourinary: Negative for decreased urine volume, difficulty urinating, dysuria, flank pain, frequency, hematuria, pelvic pain, urgency and vaginal bleeding.  Musculoskeletal: Negative for back pain.  Neurological: Positive for headaches.    OBJECTIVE Patient Vitals for the past 24 hrs:  BP Temp Temp src Pulse Resp Height Weight  12/16/16 2107 139/77 98.1 F (36.7 C) Oral 75 20 5\' 2"  (1.575 m) 215 lb (97.5 kg)   Constitutional: Well-developed, well-nourished female in intermittent mod-severe distress.  Skin : No pallor Cardiovascular: normal rate Respiratory: normal rate and effort.  GI: Abd soft, mild-mod TTP across entire upper abd. No guarding, rebound tenderness or palpable mass. Well-healed vertical incision. Decreased, but  Neurologic: Alert and oriented x 4.  GU: Neg CVAT.  SPECULUM EXAM: Deferred  LAB RESULTS No results found for this or any previous visit (from the past 24 hour(s)).  Results for orders placed or performed during the hospital encounter of 12/16/16 (from the past 24 hour(s))  Urinalysis, Routine w reflex microscopic     Status: Abnormal   Collection Time: 12/16/16  8:56 PM  Result Value Ref Range   Color, Urine YELLOW YELLOW   APPearance CLEAR CLEAR   Specific Gravity, Urine 1.019 1.005 - 1.030   pH 5.0 5.0 - 8.0   Glucose, UA NEGATIVE NEGATIVE mg/dL   Hgb urine dipstick NEGATIVE NEGATIVE   Bilirubin Urine NEGATIVE NEGATIVE   Ketones, ur NEGATIVE NEGATIVE mg/dL   Protein, ur NEGATIVE NEGATIVE mg/dL   Nitrite NEGATIVE NEGATIVE   Leukocytes, UA TRACE (A) NEGATIVE   RBC /  HPF NONE SEEN 0 - 5 RBC/hpf   WBC, UA 6-30 0 - 5 WBC/hpf   Bacteria, UA NONE SEEN NONE SEEN   Squamous Epithelial / LPF 0-5 (A) NONE SEEN   Mucous PRESENT   CBC with Differential/Platelet     Status: None   Collection Time: 12/16/16 10:10 PM  Result Value Ref Range   WBC 7.7 4.0 - 10.5 K/uL   RBC 4.51 3.87 - 5.11 MIL/uL   Hemoglobin 13.1 12.0 - 15.0 g/dL   HCT 41.0 36.0 - 46.0 %   MCV 90.9 78.0 - 100.0 fL   MCH 29.0 26.0 - 34.0 pg   MCHC 32.0 30.0 - 36.0 g/dL   RDW 14.8 11.5 - 15.5 %   Platelets 354 150 - 400 K/uL   Neutrophils Relative % 65 %   Neutro Abs 5.0 1.7 - 7.7 K/uL   Lymphocytes Relative 31 %   Lymphs Abs 2.4 0.7 - 4.0 K/uL   Monocytes Relative 3 %   Monocytes Absolute 0.3 0.1 - 1.0 K/uL   Eosinophils Relative 1 %   Eosinophils Absolute 0.1 0.0 - 0.7 K/uL   Basophils Relative 0 %   Basophils Absolute 0.0 0.0 - 0.1 K/uL  Comprehensive metabolic panel     Status: Abnormal   Collection Time: 12/16/16 10:10 PM  Result Value Ref Range   Sodium 138 135 - 145 mmol/L   Potassium 4.0 3.5 -  5.1 mmol/L   Chloride 105 101 - 111 mmol/L   CO2 24 22 - 32 mmol/L   Glucose, Bld 131 (H) 65 - 99 mg/dL   BUN 16 6 - 20 mg/dL   Creatinine, Ser 1.20 (H) 0.44 - 1.00 mg/dL   Calcium 9.5 8.9 - 10.3 mg/dL   Total Protein 7.1 6.5 - 8.1 g/dL   Albumin 3.4 (L) 3.5 - 5.0 g/dL   AST 22 15 - 41 U/L   ALT 15 14 - 54 U/L   Alkaline Phosphatase 144 (H) 38 - 126 U/L   Total Bilirubin 1.0 0.3 - 1.2 mg/dL   GFR calc non Af Amer 43 (L) >60 mL/min   GFR calc Af Amer 50 (L) >60 mL/min   Anion gap 9 5 - 15  Lipase, blood     Status: None   Collection Time: 12/16/16 10:10 PM  Result Value Ref Range   Lipase 37 11 - 51 U/L  Amylase     Status: Abnormal   Collection Time: 12/16/16 10:10 PM  Result Value Ref Range   Amylase 139 (H) 28 - 100 U/L     IMAGING Ct Abdomen Pelvis Wo Contrast  Result Date: 12/17/2016 CLINICAL DATA:  Acute onset of generalized abdominal pain. Elevated amylase. White  blood cells in the urine. Initial encounter. EXAM: CT ABDOMEN AND PELVIS WITHOUT CONTRAST TECHNIQUE: Multidetector CT imaging of the abdomen and pelvis was performed following the standard protocol without IV contrast. COMPARISON:  CT of the abdomen and pelvis from 04/23/2015 FINDINGS: Lower chest: The visualized lung bases are grossly clear. The visualized portions of the mediastinum are unremarkable. Hepatobiliary: The liver is unremarkable in appearance. The patient is status post cholecystectomy, with clips noted at the gallbladder fossa. The common bile duct remains normal in caliber. Trace free fluid is seen tracking about the liver. Pancreas: The pancreas is within normal limits. Spleen: The spleen is unremarkable in appearance. Adrenals/Urinary Tract: The patient is status post left-sided nephrectomy. The right adrenal gland is unremarkable in appearance. The right kidney is grossly unremarkable. There is no evidence of hydronephrosis. No renal or ureteral stones are identified. No perinephric stranding is seen. Stomach/Bowel: The patient is status post right hemicolectomy. The ileocolic anastomosis is grossly unremarkable in appearance. There is vague soft tissue inflammation and wall thickening at the mid to distal ileum at the right lower quadrant, concerning for infectious or inflammatory ileitis. Remaining small bowel loops are grossly unremarkable. The stomach is unremarkable in appearance. Vascular/Lymphatic: Scattered calcification is seen along the abdominal aorta and its branches. The abdominal aorta is otherwise grossly unremarkable. The inferior vena cava is grossly unremarkable. No retroperitoneal lymphadenopathy is seen. No pelvic sidewall lymphadenopathy is identified. Reproductive: The bladder is decompressed and not well assessed. The uterus is grossly unremarkable. No suspicious adnexal masses are seen. A small amount of free fluid is seen within the pelvis. Other: No additional soft tissue  abnormalities are seen. Musculoskeletal: No acute osseous abnormalities are identified. There is grade 2 anterolisthesis of L5 on S1, reflecting underlying facet disease. Multilevel vacuum phenomenon is noted along the lumbar spine. The visualized musculature is unremarkable in appearance. IMPRESSION: 1. Vague soft tissue inflammation and wall thickening at the mid to distal ileum at the right lower quadrant, concerning for infectious or inflammatory ileitis. 2. Underlying small volume ascites within the abdomen and pelvis. 3. Scattered aortic atherosclerosis. 4. Grade 2 anterolisthesis of L5 on S1, reflecting underlying facet disease. Mild degenerative change along the lumbar spine.  Electronically Signed   By: Garald Balding M.D.   On: 12/17/2016 03:21   Dg Abd 2 Views  Result Date: 12/16/2016 CLINICAL DATA:  Awoke this morning with headache and mid abdominal cramping diarrhea. History of colon cancer and small bowel obstruction. EXAM: ABDOMEN - 2 VIEW COMPARISON:  04/19/2015 FINDINGS: Bowel gas pattern is nonobstructive. There are a few nonspecific scattered air-fluid levels. No evidence of free peritoneal air. No mass or mass effect. Multiple surgical clips over the abdomen unchanged degenerative change of the spine, sacroiliac joints, hips symphysis pubis joint. IMPRESSION: Nonspecific, nonobstructive bowel gas pattern. Electronically Signed   By: Marin Olp M.D.   On: 12/16/2016 23:25     MAU COURSE Orders Placed This Encounter  Procedures  . Urinalysis, Routine w reflex microscopic  . CBC with Differential/Platelet  . Comprehensive metabolic panel  . Lipase, blood  . Amylase  . Diet NPO time specified  . Insert peripheral IV   Meds ordered this encounter  Medications  . lisinopril (PRINIVIL,ZESTRIL) 5 MG tablet    Sig: Take 5 mg by mouth daily.  Marland Kitchen HYDROmorphone (DILAUDID) injection 1 mg   No evidence of Gyn problem after initial eval. Minimal improvement in pain after 1 mg Dilaudid.  CNM trying to minimize opiate use 2/2 concern for obstruction. Contraindications and allergies to NSAIDS. Discussed concern for GI problem w/ pt and family and recommended eval at ED for their expertise. Pt and family strongly prefer work-up at [facility] if safe and reasonable. Discussed Hx, labs, exam w/ Dr. Elonda Husky.  Agrees w/ POC. New orders: Abd -ray to eval for bowel obstruction and consult ED MD. Discussed pt w/ Dr. Christy Gentles at [facility] ED. The only other eval he recommends is abd CT and he does not think she needs transfer to [facility] for this if it can be done at [facility]. Pt wants to stay at [facility]. CNM explained that she would need to be transferred if there were any abnml findings on CT requiring immediate management.   Pt much more comfortable-appearing, resting. Discussed findings of ileitis w/ Pt and family. No emergent condition. Pt and family's questions addressed   MDM - CT shows possible ileitis. Discussed w/ Dr. Elonda Husky. No transfer or immediate interventions necessary. Can F/U OP w/ GI. Pt non-toxic appearing. Tolerating PO's. Appropriate for OP management.   ASSESSMENT 1. Ileitis   2. History of small bowel obstruction   3. History of cholecystectomy   4. History of colon cancer   5. Abdominal pain, acute   6. Acute abdominal pain    PLAN Discharge home in stable condition per consult w/ Dr. Elonda Husky. Abd pain, bowel obstruction precautions Tramadol RPN for pain. Discussed importance of minimizing opioids.  Instructed to call her GI tomorrow and see if she should move up her appiontment for further eval of ileitis.  Follow-up Information    Drake Leach, MD .   Specialty:  Internal Medicine Contact information: [address] [place] [address] [phone]        Gastroenterologist Follow up in 2 week(s).   Why:  if pain continues       [place] DEPT Follow up.   Specialty:  Emergency Medicine Why:  in emergencies Contact information: [place] [phone] [place] [phone]         Allergies as of 12/17/2016      Reactions   Gabapentin Shortness Of Breath   Fluoxetine    Other  reaction(s): Other (See Comments) Severe headache   Naproxen Itching   Reglan [metoclopramide] Other (See Comments)   dizzy   Tizanidine Other (See Comments)   Confusion    Ivp Dye [iodinated Diagnostic Agents] Rash      Medication List    STOP taking these medications   methylphenidate 10 MG tablet Commonly known as:  RITALIN   polyethylene glycol packet Commonly known as:  MIRALAX / GLYCOLAX   verapamil 240 MG CR tablet Commonly known as:  CALAN-SR     TAKE these medications   acetaminophen 500 MG tablet Commonly known as:  TYLENOL Take 1,000 mg by mouth every 6 (six) hours as needed.   aspirin EC 81 MG tablet Take 81 mg by mouth daily.   atorvastatin 20 MG tablet Commonly known as:  LIPITOR Take 20 mg by mouth daily.   calcium carbonate 500 MG chewable tablet Commonly known as:  TUMS - dosed in mg elemental calcium Chew 2 tablets by mouth every morning.   chlorhexidine 0.12 % solution Commonly known as:  PERIDEX 15 mLs.   cyclobenzaprine 10 MG tablet Commonly known as:  FLEXERIL Take 10 mg by mouth daily as needed.   DULERA 200-5 MCG/ACT Aero Generic drug:  mometasone-formoterol Inhale 2 puffs into the lungs 2 (two) times daily as needed.   escitalopram 10 MG tablet Commonly known as:  LEXAPRO 10 mg daily.   Fish Oil 1000 MG Caps Take 1 capsule by mouth daily.   fluticasone 50 MCG/ACT nasal spray Commonly known as:  FLONASE Place 2 sprays into both nostrils daily.   furosemide 40 MG tablet Commonly known as:  LASIX Take 40 mg by mouth daily.   lidocaine 5 % Commonly known as:  [place] onto the skin.   lisinopril 5 MG tablet Commonly known as:  PRINIVIL,ZESTRIL Take 5 mg by mouth daily.   metFORMIN 500 MG 24 hr tablet Commonly known as:   GLUCOPHAGE-XR Take 500 mg by mouth daily before supper.   methadone 10 MG tablet Commonly known as:  DOLOPHINE Take 5 mg by mouth at bedtime.   multivitamin tablet Take by mouth.   ondansetron 4 MG tablet Commonly known as:  ZOFRAN Take 4 mg by mouth every 6 (six) hours as needed.   Oxycodone HCl 20 MG Tabs Take 20 mg by mouth every 4 (four) hours as needed.   pantoprazole 40 MG tablet Commonly known as:  PROTONIX Take 40 mg by mouth.   potassium chloride 10 MEQ tablet Commonly known as:  K-DUR,KLOR-CON 20 mEq 2 (two) times daily.   PROVENTIL HFA 108 (90 Base) MCG/ACT inhaler Generic drug:  albuterol 2 puffs every 6 (six) hours as needed.   silver sulfADIAZINE 1 % cream Commonly known as:  SILVADENE Apply 1 application topically 2 (two) times daily as needed (rash).   VITAMIN D3 SUPER STRENGTH 2000 units Tabs Generic drug:  Cholecalciferol Take 2,000 Units by mouth daily.         Tamala Julian, [place], [place] 12/16/2016  9:48 PM

## 2016-12-17 ENCOUNTER — Inpatient Hospital Stay (HOSPITAL_COMMUNITY): Payer: Medicare Other

## 2016-12-17 DIAGNOSIS — Z85038 Personal history of other malignant neoplasm of large intestine: Secondary | ICD-10-CM

## 2016-12-17 DIAGNOSIS — K529 Noninfective gastroenteritis and colitis, unspecified: Secondary | ICD-10-CM

## 2016-12-17 DIAGNOSIS — R109 Unspecified abdominal pain: Secondary | ICD-10-CM | POA: Diagnosis not present

## 2016-12-17 DIAGNOSIS — Z9049 Acquired absence of other specified parts of digestive tract: Secondary | ICD-10-CM

## 2016-12-17 DIAGNOSIS — Z8719 Personal history of other diseases of the digestive system: Secondary | ICD-10-CM

## 2016-12-17 MED ORDER — OXYCODONE-ACETAMINOPHEN 5-325 MG PO TABS
1.0000 | ORAL_TABLET | Freq: Once | ORAL | Status: AC
  Administered 2016-12-17: 1 via ORAL
  Filled 2016-12-17: qty 1

## 2016-12-17 MED ORDER — HYDROMORPHONE HCL 1 MG/ML IJ SOLN
1.0000 mg | Freq: Once | INTRAMUSCULAR | Status: AC
  Administered 2016-12-17: 1 mg via INTRAVENOUS
  Filled 2016-12-17: qty 1

## 2016-12-17 MED ORDER — PROMETHAZINE HCL 25 MG PO TABS
12.5000 mg | ORAL_TABLET | Freq: Once | ORAL | Status: AC | PRN
Start: 2016-12-17 — End: 2016-12-17
  Administered 2016-12-17: 12.5 mg via ORAL
  Filled 2016-12-17: qty 1

## 2016-12-17 NOTE — Discharge Instructions (Signed)

## 2017-02-10 ENCOUNTER — Encounter (HOSPITAL_BASED_OUTPATIENT_CLINIC_OR_DEPARTMENT_OTHER): Payer: Self-pay | Admitting: *Deleted

## 2017-02-10 ENCOUNTER — Emergency Department (HOSPITAL_BASED_OUTPATIENT_CLINIC_OR_DEPARTMENT_OTHER): Payer: Medicare Other

## 2017-02-10 ENCOUNTER — Emergency Department (HOSPITAL_BASED_OUTPATIENT_CLINIC_OR_DEPARTMENT_OTHER)
Admission: EM | Admit: 2017-02-10 | Discharge: 2017-02-10 | Disposition: A | Discharge status: Home / Self Care | Payer: Medicare Other | Attending: Emergency Medicine | Admitting: Emergency Medicine

## 2017-02-10 DIAGNOSIS — Z79899 Other long term (current) drug therapy: Secondary | ICD-10-CM | POA: Insufficient documentation

## 2017-02-10 DIAGNOSIS — Y929 Unspecified place or not applicable: Secondary | ICD-10-CM | POA: Diagnosis not present

## 2017-02-10 DIAGNOSIS — Z85528 Personal history of other malignant neoplasm of kidney: Secondary | ICD-10-CM | POA: Insufficient documentation

## 2017-02-10 DIAGNOSIS — S92412A Displaced fracture of proximal phalanx of left great toe, initial encounter for closed fracture: Secondary | ICD-10-CM | POA: Diagnosis not present

## 2017-02-10 DIAGNOSIS — Z7984 Long term (current) use of oral hypoglycemic drugs: Secondary | ICD-10-CM | POA: Insufficient documentation

## 2017-02-10 DIAGNOSIS — W0110XA Fall on same level from slipping, tripping and stumbling with subsequent striking against unspecified object, initial encounter: Secondary | ICD-10-CM | POA: Diagnosis not present

## 2017-02-10 DIAGNOSIS — E119 Type 2 diabetes mellitus without complications: Secondary | ICD-10-CM | POA: Insufficient documentation

## 2017-02-10 DIAGNOSIS — Y999 Unspecified external cause status: Secondary | ICD-10-CM | POA: Diagnosis not present

## 2017-02-10 DIAGNOSIS — J45909 Unspecified asthma, uncomplicated: Secondary | ICD-10-CM | POA: Diagnosis not present

## 2017-02-10 DIAGNOSIS — Z96652 Presence of left artificial knee joint: Secondary | ICD-10-CM | POA: Insufficient documentation

## 2017-02-10 DIAGNOSIS — S63612A Unspecified sprain of right middle finger, initial encounter: Secondary | ICD-10-CM | POA: Diagnosis not present

## 2017-02-10 DIAGNOSIS — S99922A Unspecified injury of left foot, initial encounter: Secondary | ICD-10-CM | POA: Diagnosis present

## 2017-02-10 DIAGNOSIS — I10 Essential (primary) hypertension: Secondary | ICD-10-CM | POA: Diagnosis not present

## 2017-02-10 DIAGNOSIS — Y9301 Activity, walking, marching and hiking: Secondary | ICD-10-CM | POA: Insufficient documentation

## 2017-02-10 DIAGNOSIS — S92402A Displaced unspecified fracture of left great toe, initial encounter for closed fracture: Secondary | ICD-10-CM

## 2017-02-10 DIAGNOSIS — W19XXXA Unspecified fall, initial encounter: Secondary | ICD-10-CM

## 2017-02-10 NOTE — ED Notes (Signed)
Family at bedside. 

## 2017-02-10 NOTE — ED Notes (Signed)
Patient transported to X-ray 

## 2017-02-10 NOTE — ED Provider Notes (Signed)
[facility] DEPT [facility] Provider Note   CSN: [id number] Arrival date & time: 02/10/17  1412     History   Chief Complaint Chief Complaint  Patient presents with  . Fall    HPI Cathy Richardson is a 76 y.o. female.  Patient is a 76 year old female who presents after a fall. This morning she tripped over a cord on the floor and fell. She injured her left big toe in her right middle finger. She states she scraped her nose on the floor but does not feel like she hit her head on the ground. There is no loss of consciousness. No neck or back pain other than she has some chronic back pain which is unchanged from her baseline. She denies any headache. No nausea or vomiting. No chest or abdominal pain. No other extremity pain other than the toe and middle finger pain. She is not on anticoagulants.      Past Medical History:  Diagnosis Date  . Anemia   . Arthritis   . Asthma   . Cancer [facility])    History of Renal Cancer  . Depression   . Diabetes mellitus without complication ([place])   . Excessive somnolence disorder   . Family history of adverse reaction to anesthesia    son has problems with nausea   . GERD (gastroesophageal reflux disease)   . Glaucoma    pt denies on preop visit of 01/03/2015   . Headache   . Heart murmur   . High cholesterol   . History of hiatal hernia   . Hypertension   . Osteoporosis   . Peptic ulcer   . Renal disorder    only one kidney   . Sleep apnea    cpap- does not know settings   . Urinary tract infection    hx     Patient Active Problem List   Diagnosis Date Noted  . 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 Hypertension 11/12/2015  . GERD (gastroesophageal reflux disease) 11/12/2015  . Diabetes mellitus without complication ([place]) 72/53/6644  . Vitamin D deficiency 11/12/2015  . High cholesterol 11/12/2015  . Depression 11/12/2015  . Chronic pain syndrome 11/12/2015  . Asthma 11/12/2015  . Sublingual gland swelling  01/05/2015  . Prolapsed sublingual gland 12/30/2014    Past Surgical History:  Procedure Laterality Date  . bowel obstruction surgery      x 2   . Carpule Tunnel Release Bilateral   . CHOLECYSTECTOMY    . JOINT REPLACEMENT     left knee replacement   . KNEE ARTHROCENTESIS     left knee  . NEPHRECTOMY    . Removal of Skin Cancer  N/A    Forehead  . SKIN SPLIT GRAFT N/A 01/05/2015   Procedure: MANDIBULAR LINGUAL SPLIT THICKNESS SKIN GRAFT VESTIVULOPLASTY;  Surgeon: Michaela Corner, DDS;  Location: [facility] ORS;  Service: Oral Surgery;  Laterality: N/A;    OB History    Gravida Para Term Preterm AB Living   7 7 6 1   5    SAB TAB Ectopic Multiple Live Births                   Home Medications    Prior to Admission medications   Medication Sig Start Date End Date Taking? Authorizing Provider  acetaminophen (TYLENOL) 500 MG tablet Take 1,000 mg by mouth every 6 (six) hours as needed.  11/04/15   [provider]  albuterol (PROVENTIL HFA) 108 (90 Base)  MCG/ACT inhaler 2 puffs every 6 (six) hours as needed.  09/26/15   [provider]  aspirin EC 81 MG tablet Take 81 mg by mouth daily.     [provider]  atorvastatin (LIPITOR) 20 MG tablet Take 20 mg by mouth daily.     [provider]  calcium carbonate (TUMS - DOSED IN MG ELEMENTAL CALCIUM) 500 MG chewable tablet Chew 2 tablets by mouth every morning.     [provider]  chlorhexidine (PERIDEX) 0.12 % solution 15 mLs. 12/02/14   [provider]  Cholecalciferol (VITAMIN D3 SUPER STRENGTH) 2000 units TABS Take 2,000 Units by mouth daily.     [provider]  cyclobenzaprine (FLEXERIL) 10 MG tablet Take 10 mg by mouth daily as needed.  05/23/13   [provider]  escitalopram (LEXAPRO) 10 MG tablet 10 mg daily.  09/09/15   [provider]  fluticasone (FLONASE) 50 MCG/ACT nasal spray Place 2 sprays into both nostrils daily.  08/12/15   [provider]    furosemide (LASIX) 40 MG tablet Take 40 mg by mouth daily.     [provider]  lidocaine (LIDODERM) 5 % Place onto the skin. 09/01/15   [provider]  lisinopril (PRINIVIL,ZESTRIL) 5 MG tablet Take 5 mg by mouth daily.    [provider]  metFORMIN (GLUCOPHAGE-XR) 500 MG 24 hr tablet Take 500 mg by mouth daily before supper.    [provider]  methadone (DOLOPHINE) 10 MG tablet Take 5 mg by mouth at bedtime.  10/04/15   [provider]  mometasone-formoterol (DULERA) 200-5 MCG/ACT AERO Inhale 2 puffs into the lungs 2 (two) times daily as needed.     [provider]  Multiple Vitamin (MULTIVITAMIN) tablet Take by mouth.    [provider]  Omega-3 Fatty Acids (FISH OIL) 1000 MG CAPS Take 1 capsule by mouth daily.    [provider]  ondansetron (ZOFRAN) 4 MG tablet Take 4 mg by mouth every 6 (six) hours as needed.  12/07/14   [provider]  Oxycodone HCl 20 MG TABS Take 20 mg by mouth every 4 (four) hours as needed.  11/04/15   [provider]  pantoprazole (PROTONIX) 40 MG tablet Take 40 mg by mouth. 12/01/14   [provider]  potassium chloride (K-DUR,KLOR-CON) 10 MEQ tablet 20 mEq 2 (two) times daily.  07/20/15   [provider]  silver sulfADIAZINE (SILVADENE) 1 % cream Apply 1 application topically 2 (two) times daily as needed (rash).     [provider]    Family History History reviewed. No pertinent family history.  Social History Social History  Substance Use Topics  . Smoking status: Never Smoker  . Smokeless tobacco: Never Used  . Alcohol use No     Allergies   Gabapentin; Fluoxetine; Naproxen; Reglan [metoclopramide]; Tizanidine; and Ivp dye [iodinated diagnostic agents]   Review of Systems Review of Systems  Constitutional: Negative for activity change, appetite change and fever.  HENT: Negative for dental problem, nosebleeds and trouble swallowing.    Eyes: Negative for pain and visual disturbance.  Respiratory: Negative for shortness of breath.   Cardiovascular: Negative for chest pain.  Gastrointestinal: Negative for abdominal pain, nausea and vomiting.  Genitourinary: Negative for dysuria and hematuria.  Musculoskeletal: Positive for arthralgias and back pain (chronic, unchanged from baseline). Negative for joint swelling and neck pain.  Skin: Negative for wound.  Neurological: Negative for weakness, numbness and headaches.  Psychiatric/Behavioral: Negative for confusion.     Physical Exam Updated Vital Signs BP 138/83 (BP Location: Left Arm)   Pulse 94   Temp 98.8 F (37.1 C) (Oral)   Resp 20   SpO2 96%   Physical Exam  Constitutional: She is oriented to person, place, and time. She appears well-developed and well-nourished.  HENT:  Head: Normocephalic and atraumatic.  Nose: Nose normal.  Small abrasion to the nasal bridge. There is no deformity. No pain on palpation of the nose. No septal hematoma. No blood in the nares. No facial tenderness is noted.  Eyes: Pupils are equal, round, and reactive to light. Conjunctivae are normal.  Neck:  No pain to the cervical, thoracic, or LS spine.  No step-offs or deformities noted  Cardiovascular: Normal rate and regular rhythm.   No murmur heard. No evidence of external trauma to the chest or abdomen  Pulmonary/Chest: Effort normal and breath sounds normal. No respiratory distress. She has no wheezes. She exhibits no tenderness.  Abdominal: Soft. Bowel sounds are normal. She exhibits no distension. There is no tenderness.  Musculoskeletal: Normal range of motion.  Patient has swelling over the PIP joint of the right middle finger. There some ecchymosis to this area. She has full flexion and extension of the hand. There is no other bony tenderness to the hand. She is neurovascularly intact distally. She also has some swelling and ecchymosis to the left big toe. No subungual  hematoma. There is bony tenderness to the big toe but no other bony tenderness to the foot. No pain on palpation or ROM of the other extremities  Neurological: She is alert and oriented to person, place, and time.  Skin: Skin is warm and dry. Capillary refill takes less than 2 seconds.  Psychiatric: She has a normal mood and affect.  Vitals reviewed.    ED Treatments / Results  Labs (all labs ordered are listed, but only abnormal results are displayed) Labs Reviewed - No data to display  EKG  EKG Interpretation None       Radiology Dg Finger Middle Right  Result Date: 02/10/2017 CLINICAL DATA:  Fall today. Acute pain involving the PIP joint of the middle finger. Initial encounter. EXAM: RIGHT MIDDLE FINGER 2+V COMPARISON:  Right hand radiographs 02/17/2012 FINDINGS: Mild osteopenia is present. Soft tissue swelling is noted about the PIP joint. The joint is located. No significant fracture is present. There is no radiopaque foreign body. IMPRESSION: 1. Soft tissue swelling about the PIP joint without underlying fracture or traumatic subluxation. Electronically Signed   By: San Morelle M.D.   On: 02/10/2017 14:58   Dg Foot Complete Left  Result Date: 02/10/2017 CLINICAL DATA:  Fall today. Left great toe swelling. Pain with swelling and bruising. EXAM: LEFT FOOT - COMPLETE 3+ VIEW COMPARISON:  None available. FINDINGS: Mild osteopenia is present. A nondisplaced fracture is present at the tuft of the distal phalanx in the great toe. The toes otherwise unremarkable. The joints are located. No additional fractures are present. The remainder the foot is unremarkable. IMPRESSION: Nondisplaced fracture involving the tuft of the distal phalanx in the great toe. Electronically Signed   By: San Morelle M.D.   On: 02/10/2017 14:57    Procedures Procedures (including critical care time)  Medications Ordered in ED Medications - No data to display   Initial Impression /  Assessment and Plan / ED Course  I have reviewed the triage vital signs and the nursing notes.  Pertinent labs &  imaging results that were available during my care of the patient were reviewed by me and considered in my medical decision making (see chart for details).     Patient presents after mechanical fall. She has evidence of a tuft fracture of the left big toe. She had her toes buddy taped and was placed in a postop shoe. There is no evident fracture of the right middle finger. She did have this finger buddy taped as well. No other injuries are identified. She scraped the edge of her nose but she did not have any other head injuries I did not feel that she needed CT imaging of her head or C-spine. She denies any other complaints. She is encouraged to follow-up with her PCP. Return precautions were given.  Final Clinical Impressions(s) / ED Diagnoses   Final diagnoses:  Fall, initial encounter  Closed non-physeal fracture of phalanx of left great toe, unspecified phalanx, initial encounter  Sprain of right middle finger, unspecified site of finger, initial encounter    New Prescriptions New Prescriptions   No medications on file     Malvin Johns, MD 02/10/17 [phone]

## 2017-02-10 NOTE — ED Triage Notes (Signed)
Pt reports that she tripped this morning and fell.  Reports left great toe pain, right middle finger pain since that time. Denies LOC. Pt on her phone during triage.

## 2017-02-13 ENCOUNTER — Telehealth (HOSPITAL_BASED_OUTPATIENT_CLINIC_OR_DEPARTMENT_OTHER): Payer: Self-pay | Admitting: Emergency Medicine

## 2017-04-22 ENCOUNTER — Other Ambulatory Visit: Payer: Medicare Other

## 2017-04-23 ENCOUNTER — Encounter: Payer: Self-pay | Admitting: *Deleted

## 2017-04-23 ENCOUNTER — Other Ambulatory Visit: Payer: Medicare Other

## 2017-05-14 ENCOUNTER — Telehealth: Payer: Self-pay | Admitting: General Practice

## 2017-05-14 NOTE — Telephone Encounter (Signed)
Patient's results are back for myrisk hereditary screen, results negative. Called patient, no answer- left message stating we are trying to reach you with results & to let you know they are available for pick up.

## 2017-05-15 ENCOUNTER — Encounter: Payer: Self-pay | Admitting: *Deleted

## 2018-05-11 ENCOUNTER — Emergency Department (HOSPITAL_BASED_OUTPATIENT_CLINIC_OR_DEPARTMENT_OTHER): Payer: Medicare Other

## 2018-05-11 ENCOUNTER — Other Ambulatory Visit: Payer: Self-pay

## 2018-05-11 ENCOUNTER — Encounter (HOSPITAL_BASED_OUTPATIENT_CLINIC_OR_DEPARTMENT_OTHER): Payer: Self-pay

## 2018-05-11 ENCOUNTER — Emergency Department (HOSPITAL_BASED_OUTPATIENT_CLINIC_OR_DEPARTMENT_OTHER)
Admission: EM | Admit: 2018-05-11 | Discharge: 2018-05-12 | Disposition: A | Discharge status: Home / Self Care | Payer: Medicare Other | Attending: Emergency Medicine | Admitting: Emergency Medicine

## 2018-05-11 DIAGNOSIS — E119 Type 2 diabetes mellitus without complications: Secondary | ICD-10-CM | POA: Diagnosis not present

## 2018-05-11 DIAGNOSIS — Z905 Acquired absence of kidney: Secondary | ICD-10-CM | POA: Diagnosis not present

## 2018-05-11 DIAGNOSIS — J45909 Unspecified asthma, uncomplicated: Secondary | ICD-10-CM | POA: Insufficient documentation

## 2018-05-11 DIAGNOSIS — Z79899 Other long term (current) drug therapy: Secondary | ICD-10-CM | POA: Insufficient documentation

## 2018-05-11 DIAGNOSIS — Z85038 Personal history of other malignant neoplasm of large intestine: Secondary | ICD-10-CM | POA: Insufficient documentation

## 2018-05-11 DIAGNOSIS — Z950 Presence of cardiac pacemaker: Secondary | ICD-10-CM | POA: Insufficient documentation

## 2018-05-11 DIAGNOSIS — Z85528 Personal history of other malignant neoplasm of kidney: Secondary | ICD-10-CM | POA: Insufficient documentation

## 2018-05-11 DIAGNOSIS — I10 Essential (primary) hypertension: Secondary | ICD-10-CM | POA: Diagnosis not present

## 2018-05-11 DIAGNOSIS — Z7982 Long term (current) use of aspirin: Secondary | ICD-10-CM | POA: Diagnosis not present

## 2018-05-11 DIAGNOSIS — N390 Urinary tract infection, site not specified: Secondary | ICD-10-CM | POA: Diagnosis not present

## 2018-05-11 DIAGNOSIS — R197 Diarrhea, unspecified: Secondary | ICD-10-CM | POA: Diagnosis not present

## 2018-05-11 DIAGNOSIS — R109 Unspecified abdominal pain: Secondary | ICD-10-CM | POA: Diagnosis present

## 2018-05-11 DIAGNOSIS — Z7984 Long term (current) use of oral hypoglycemic drugs: Secondary | ICD-10-CM | POA: Diagnosis not present

## 2018-05-11 DIAGNOSIS — Z85828 Personal history of other malignant neoplasm of skin: Secondary | ICD-10-CM | POA: Diagnosis not present

## 2018-05-11 HISTORY — DX: Dorsalgia, unspecified: M54.9

## 2018-05-11 HISTORY — DX: Other chronic pain: G89.29

## 2018-05-11 LAB — CBC
HCT: 38 % (ref 36.0–46.0)
HEMOGLOBIN: 12 g/dL (ref 12.0–15.0)
MCH: 29.7 pg (ref 26.0–34.0)
MCHC: 31.6 g/dL (ref 30.0–36.0)
MCV: 94.1 fL (ref 80.0–100.0)
Platelets: 266 10*3/uL (ref 150–400)
RBC: 4.04 MIL/uL (ref 3.87–5.11)
RDW: 13.1 % (ref 11.5–15.5)
WBC: 5.3 10*3/uL (ref 4.0–10.5)
nRBC: 0 % (ref 0.0–0.2)

## 2018-05-11 LAB — URINALYSIS, MICROSCOPIC (REFLEX)

## 2018-05-11 LAB — URINALYSIS, ROUTINE W REFLEX MICROSCOPIC
Bilirubin Urine: NEGATIVE
Glucose, UA: NEGATIVE mg/dL
Ketones, ur: 15 mg/dL — AB
NITRITE: NEGATIVE
Protein, ur: NEGATIVE mg/dL
SPECIFIC GRAVITY, URINE: 1.025 (ref 1.005–1.030)
pH: 5.5 (ref 5.0–8.0)

## 2018-05-11 LAB — COMPREHENSIVE METABOLIC PANEL
ALT: 14 U/L (ref 0–44)
ANION GAP: 8 (ref 5–15)
AST: 22 U/L (ref 15–41)
Albumin: 3.5 g/dL (ref 3.5–5.0)
Alkaline Phosphatase: 117 U/L (ref 38–126)
BILIRUBIN TOTAL: 0.7 mg/dL (ref 0.3–1.2)
BUN: 11 mg/dL (ref 8–23)
CO2: 26 mmol/L (ref 22–32)
Calcium: 9.6 mg/dL (ref 8.9–10.3)
Chloride: 107 mmol/L (ref 98–111)
Creatinine, Ser: 1.15 mg/dL — ABNORMAL HIGH (ref 0.44–1.00)
GFR calc Af Amer: 52 mL/min — ABNORMAL LOW (ref >60)
GFR calc non Af Amer: 45 mL/min — ABNORMAL LOW (ref >60)
GLUCOSE: 109 mg/dL — AB (ref 70–99)
POTASSIUM: 4 mmol/L (ref 3.5–5.1)
Sodium: 141 mmol/L (ref 135–145)
TOTAL PROTEIN: 6.7 g/dL (ref 6.5–8.1)

## 2018-05-11 LAB — LIPASE, BLOOD: LIPASE: 42 U/L (ref 11–51)

## 2018-05-11 MED ORDER — METOCLOPRAMIDE HCL 5 MG/ML IJ SOLN: 5.0000 mg | Freq: Once | INTRAMUSCULAR | Status: DC

## 2018-05-11 MED ORDER — SODIUM CHLORIDE 0.9 % IV SOLN
1.0000 g | Freq: Once | INTRAVENOUS | Status: AC
  Administered 2018-05-11: 1 g via INTRAVENOUS
  Filled 2018-05-11: qty 10

## 2018-05-11 MED ORDER — CEPHALEXIN 500 MG PO CAPS: 500.0000 mg | ORAL_CAPSULE | Freq: Two times a day (BID) | ORAL | 0 refills | Status: DC

## 2018-05-11 MED ORDER — MORPHINE SULFATE (PF) 4 MG/ML IV SOLN
4.0000 mg | Freq: Once | INTRAVENOUS | Status: AC
  Administered 2018-05-11: 4 mg via INTRAVENOUS
  Filled 2018-05-11: qty 1

## 2018-05-11 MED ORDER — ONDANSETRON HCL 4 MG/2ML IJ SOLN
4.0000 mg | Freq: Once | INTRAMUSCULAR | Status: AC
  Administered 2018-05-11: 4 mg via INTRAVENOUS
  Filled 2018-05-11: qty 2

## 2018-05-11 MED ORDER — SODIUM CHLORIDE 0.9 % IV BOLUS
500.0000 mL | Freq: Once | INTRAVENOUS | Status: AC
  Administered 2018-05-11: 500 mL via INTRAVENOUS

## 2018-05-11 MED ORDER — ONDANSETRON HCL 4 MG/2ML IJ SOLN
4.0000 mg | Freq: Once | INTRAMUSCULAR | Status: AC | PRN
  Administered 2018-05-11: 4 mg via INTRAVENOUS
  Filled 2018-05-11: qty 2

## 2018-05-11 MED ORDER — ONDANSETRON 8 MG PO TBDP: ORAL_TABLET | ORAL | 0 refills | Status: DC

## 2018-05-11 NOTE — ED Notes (Signed)
C/o n/d onset last pm w left lower abd pain off and on

## 2018-05-11 NOTE — ED Provider Notes (Signed)
[facility] EMERGENCY DEPARTMENT Provider Note   CSN: [id number] Arrival date & time: 05/11/18  1727     History   Chief Complaint Chief Complaint  Patient presents with  . Abdominal Pain    HPI Cathy Richardson is a 77 y.o. female.  HPI   Patient is a 77 year old female with a history of renal carcinoma s/p left nephrectomy, colon cancer s/p multiple bowel resections, gastritis, hypertension, hyperlipidemia, hiatal hernia and T2DM who presents the emergency department today for evaluation of abdominal pain nausea and diarrhea that began last night.  Patient reports multiple episodes of dark brown diarrhea starting last night.  Denies hematochezia or melena.  Also with severe nausea, but no episodes of vomiting.  Also has constant, 9/10 left lower quadrant abdominal pain that is nonradiating.  Denies dysuria, frequency or urgency.  No recent antibiotic use.  States she had an upper endoscopy July 2019 that showed gastritis.  Past Medical History:  Diagnosis Date  . Anemia   . Arthritis   . Asthma   . Cancer [facility])    History of Renal Cancer  . Chronic back pain   . Depression   . Diabetes mellitus without complication ([place])   . Excessive somnolence disorder   . Family history of adverse reaction to anesthesia    son has problems with nausea   . GERD (gastroesophageal reflux disease)   . Glaucoma    pt denies on preop visit of 01/03/2015   . Headache   . Heart murmur   . High cholesterol   . History of hiatal hernia   . Hypertension   . Osteoporosis   . Peptic ulcer   . Renal disorder    only one kidney   . Sleep apnea    cpap- does not know settings   . Urinary tract infection    hx     Patient Active Problem List   Diagnosis Date Noted  . 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 Hypertension 11/12/2015  . GERD (gastroesophageal reflux disease) 11/12/2015  . Diabetes mellitus without complication ([place]) 52/84/1324  . Vitamin D  deficiency 11/12/2015  . High cholesterol 11/12/2015  . Depression 11/12/2015  . Chronic pain syndrome 11/12/2015  . Asthma 11/12/2015  . Sublingual gland swelling 01/05/2015  . Prolapsed sublingual gland 12/30/2014    Past Surgical History:  Procedure Laterality Date  . bowel obstruction surgery      x 2   . Carpule Tunnel Release Bilateral   . CHOLECYSTECTOMY    . JOINT REPLACEMENT     left knee replacement   . KNEE ARTHROCENTESIS     left knee  . NEPHRECTOMY    . PACEMAKER IMPLANT    . Removal of Skin Cancer  N/A    Forehead  . SKIN SPLIT GRAFT N/A 01/05/2015   Procedure: MANDIBULAR LINGUAL SPLIT THICKNESS SKIN GRAFT VESTIVULOPLASTY;  Surgeon: Michaela Corner, DDS;  Location: [facility] ORS;  Service: Oral Surgery;  Laterality: N/A;     OB History    Gravida  7   Para  7   Term  6   Preterm  1   AB      Living  5     SAB      TAB      Ectopic      Multiple      Live Births               Home Medications  Prior to Admission medications   Medication Sig Start Date End Date Taking? Authorizing Provider  acetaminophen (TYLENOL) 500 MG tablet Take 1,000 mg by mouth every 6 (six) hours as needed.  11/04/15   [provider]  albuterol (PROVENTIL HFA) 108 (90 Base) MCG/ACT inhaler 2 puffs every 6 (six) hours as needed.  09/26/15   [provider]  aspirin EC 81 MG tablet Take 81 mg by mouth daily.     [provider]  atorvastatin (LIPITOR) 20 MG tablet Take 20 mg by mouth daily.     [provider]  calcium carbonate (TUMS - DOSED IN MG ELEMENTAL CALCIUM) 500 MG chewable tablet Chew 2 tablets by mouth every morning.     [provider]  chlorhexidine (PERIDEX) 0.12 % solution 15 mLs. 12/02/14   [provider]  Cholecalciferol (VITAMIN D3 SUPER STRENGTH) 2000 units TABS Take 2,000 Units by mouth daily.     [provider]  cyclobenzaprine (FLEXERIL) 10 MG tablet Take 10 mg by mouth daily as needed.   05/23/13   [provider]  escitalopram (LEXAPRO) 10 MG tablet 10 mg daily.  09/09/15   [provider]  fluticasone (FLONASE) 50 MCG/ACT nasal spray Place 2 sprays into both nostrils daily.  08/12/15   [provider]  furosemide (LASIX) 40 MG tablet Take 40 mg by mouth daily.     [provider]  lidocaine (LIDODERM) 5 % Place onto the skin. 09/01/15   [provider]  lisinopril (PRINIVIL,ZESTRIL) 5 MG tablet Take 5 mg by mouth daily.    [provider]  metFORMIN (GLUCOPHAGE-XR) 500 MG 24 hr tablet Take 500 mg by mouth daily before supper.    [provider]  methadone (DOLOPHINE) 10 MG tablet Take 5 mg by mouth at bedtime.  10/04/15   [provider]  mometasone-formoterol (DULERA) 200-5 MCG/ACT AERO Inhale 2 puffs into the lungs 2 (two) times daily as needed.     [provider]  Multiple Vitamin (MULTIVITAMIN) tablet Take by mouth.    [provider]  Omega-3 Fatty Acids (FISH OIL) 1000 MG CAPS Take 1 capsule by mouth daily.    [provider]  ondansetron (ZOFRAN) 4 MG tablet Take 4 mg by mouth every 6 (six) hours as needed.  12/07/14   [provider]  Oxycodone HCl 20 MG TABS Take 20 mg by mouth every 4 (four) hours as needed.  11/04/15   [provider]  pantoprazole (PROTONIX) 40 MG tablet Take 40 mg by mouth. 12/01/14   [provider]  potassium chloride (K-DUR,KLOR-CON) 10 MEQ tablet 20 mEq 2 (two) times daily.  07/20/15   [provider]  silver sulfADIAZINE (SILVADENE) 1 % cream Apply 1 application topically 2 (two) times daily as needed (rash).     [provider]    Family History No family history on file.  Social History Social History   Tobacco Use  . Smoking status: Never Smoker  . Smokeless tobacco: Never Used  Substance Use Topics  . Alcohol use: No  . Drug use: No     Allergies   Gabapentin; Fluoxetine; Naproxen; Reglan  [metoclopramide]; Tizanidine; and Ivp dye [iodinated diagnostic agents]   Review of Systems Review of Systems  Constitutional: Negative for chills and fever.  HENT: Negative for congestion.   Eyes: Negative for visual disturbance.  Respiratory: Negative for shortness of breath.   Cardiovascular: Negative for chest pain.  Gastrointestinal: Positive for abdominal pain,  diarrhea, nausea and vomiting. Negative for blood in stool and constipation.  Genitourinary: Negative for dysuria, flank pain, frequency, hematuria and urgency.  Musculoskeletal: Negative for myalgias.  Skin: Negative for rash.  Neurological: Negative for headaches.   Physical Exam Updated Vital Signs BP 122/63   Pulse 67   Temp 98.8 F (37.1 C) (Oral) Comment (Src): pt refused rectal temp  Resp (!) 21   Ht 5\' 4"  (1.626 m)   Wt 96.2 kg   SpO2 95%   BMI 36.39 kg/m   Physical Exam  Constitutional: She appears well-developed and well-nourished.  Mild distress  HENT:  Head: Normocephalic and atraumatic.  Dry mucous membranes  Eyes: Conjunctivae are normal. No scleral icterus.  Neck: Neck supple.  Cardiovascular: Normal rate, regular rhythm, normal heart sounds and intact distal pulses.  No murmur heard. Pulmonary/Chest: Effort normal and breath sounds normal. No stridor. No respiratory distress. She has no wheezes. She has no rales.  Abdominal: Soft.  Active BS. TTP diffusely to the abdomen.  No rigidity, rebound or guarding.  No CVA tenderness bilaterally.  Musculoskeletal: She exhibits no edema.  Neurological: She is alert.  Skin: Skin is warm and dry. Capillary refill takes less than 2 seconds.  Psychiatric: She has a normal mood and affect.  Nursing note and vitals reviewed.   ED Treatments / Results  Labs (all labs ordered are listed, but only abnormal results are displayed) Labs Reviewed  COMPREHENSIVE METABOLIC PANEL - Abnormal; Notable for the following components:      Result Value   Glucose,  Bld 109 (*)    Creatinine, Ser 1.15 (*)    GFR calc non Af Amer 45 (*)    GFR calc Af Amer 52 (*)    All other components within normal limits  URINALYSIS, ROUTINE W REFLEX MICROSCOPIC - Abnormal; Notable for the following components:   Hgb urine dipstick SMALL (*)    Ketones, ur 15 (*)    Leukocytes, UA SMALL (*)    All other components within normal limits  URINALYSIS, MICROSCOPIC (REFLEX) - Abnormal; Notable for the following components:   Bacteria, UA MANY (*)    All other components within normal limits  URINE CULTURE  LIPASE, BLOOD  CBC    EKG None  Radiology Ct Abdomen Pelvis Wo Contrast  Result Date: 05/11/2018 CLINICAL DATA:  Intermittent left lower abdominal pain, nausea and diarrhea. History of left nephrectomy for renal cancer. EXAM: CT ABDOMEN AND PELVIS WITHOUT CONTRAST TECHNIQUE: Multidetector CT imaging of the abdomen and pelvis was performed following the standard protocol without IV contrast. COMPARISON:  12/19/2017 CT abdomen/pelvis. FINDINGS: Lower chest: No significant pulmonary nodules or acute consolidative airspace disease. Hepatobiliary: Normal liver size. No liver mass. Cholecystectomy. No biliary ductal dilatation. Pancreas: Normal, with no mass or duct dilation. Spleen: Normal size. No mass. Adrenals/Urinary Tract: No discrete adrenal nodules. Status post left nephrectomy. No mass or fluid collection in the left nephrectomy bed. No right hydronephrosis. No right renal stones. No contour deforming right renal mass. Normal bladder. Stomach/Bowel: Normal non-distended stomach. Stable postsurgical changes from apparent right hemicolectomy with no dilated small bowel loops or small bowel wall thickening. No wall thickening, diverticulosis or significant pericolonic fat stranding in the remnant large-bowel. Vascular/Lymphatic: Atherosclerotic nonaneurysmal abdominal aorta. No pathologically enlarged lymph nodes in the abdomen or pelvis. Reproductive: Grossly normal  uterus.  No adnexal mass. Other: No pneumoperitoneum, ascites or focal fluid collection. Musculoskeletal: No aggressive appearing focal osseous lesions. Marked degenerative changes in the lumbar spine.  IMPRESSION: 1. No acute abnormality. No evidence of bowel obstruction or acute bowel inflammation. 2. Stable postsurgical changes from left nephrectomy with no evidence of local tumor recurrence or metastatic disease. 3.  Aortic Atherosclerosis (ICD10-I70.0). Electronically Signed   By: Ilona Sorrel M.D.   On: 05/11/2018 19:42    Procedures Procedures (including critical care time)  Medications Ordered in ED Medications  cefTRIAXone (ROCEPHIN) 1 g in sodium chloride 0.9 % 100 mL IVPB (has no administration in time range)  ondansetron (ZOFRAN) injection 4 mg (4 mg Intravenous Given 05/11/18 1832)  morphine 4 MG/ML injection 4 mg (4 mg Intravenous Given 05/11/18 1856)  sodium chloride 0.9 % bolus 500 mL ( Intravenous Stopped 05/11/18 1930)     Initial Impression / Assessment and Plan / ED Course  I have reviewed the triage vital signs and the nursing notes.  Pertinent labs & imaging results that were available during my care of the patient were reviewed by me and considered in my medical decision making (see chart for details).     Final Clinical Impressions(s) / ED Diagnoses   Final diagnoses:  Abdominal pain, unspecified abdominal location  Diarrhea, unspecified type   Patient presenting with abdominal pain, diarrhea and nausea beginning yesterday.  No fevers, vitals are within normal limits.  Diffuse abdominal tenderness on exam but no rigidity rebound or guarding.  Normal bowel sounds.  No CVA tenderness bilaterally.  No urinary symptoms.  Denies recent antibiotic use therefore C. difficile is lower on the differential.    Lab work is very reassuring with no leukocytosis or anemia.  Normal electrolytes, kidney function is at baseline, and liver function is within normal limits.  UA with  small leukocytes, 21-50 white blood cells, and many bacteria.  This could represent an early urinary tract infection.  Urine culture was sent.  She was given a dose of ceftriaxone in the ED.  She was also given antiemetics and was able to tolerate p.o.  CT scan of the abdomen pelvis without contrast did not show any acute abnormalities to explain the patient's symptoms.  No evidence of bowel obstruction or other acute intra-abdominal or pelvic pathology at this time.  Patient care was signed out to Dr. Winfred Leeds at shift change.  ED Discharge Orders    None       Bishop Dublin 05/11/18 2043    Orlie Dakin, MD 05/11/18 [id number]

## 2018-05-11 NOTE — ED Notes (Signed)
Pt denies pain, nausea or vomiting. States that she desires discharge home. EDP notified.

## 2018-05-11 NOTE — ED Provider Notes (Signed)
Planes of diffuse abdominal pain onset this morning accompanied by nausea.  No vomiting.  No other associated symptoms.  No treatment prior to coming here she had watery bowel movement this night and states her bowel movements have been liquidy since surgery.  On exam alert no distress abdomen mild tenderness diffusely.  No guarding rigidity or rebound.  Consistent with urinary tract infection and mild renal insufficiency which is chronic. ED ECG REPORT   Date: 05/11/2018  Rate: 70  Rhythm: normal sinus rhythm  QRS Axis: normal  Intervals: PR prolonged  ST/T Wave abnormalities: nonspecific T wave changes  Conduction Disutrbances:none  Narrative Interpretation:   Old EKG Reviewed: none available  I have personally reviewed the EKG tracing and agree with the computerized printout as noted.  11:10 PM patient asymptomatic after treatment with intravenous antibiotics.  IV morphine and IV Zofran.  She feels ready to go home.  He is able to drink without nausea or vomiting. lab work remarkable for urinary tract infection Plan urine sent for culture.  Prescription Keflex, Zofran. Results for orders placed or performed during the hospital encounter of 05/11/18  Lipase, blood  Result Value Ref Range   Lipase 42 11 - 51 U/L  Comprehensive metabolic panel  Result Value Ref Range   Sodium 141 135 - 145 mmol/L   Potassium 4.0 3.5 - 5.1 mmol/L   Chloride 107 98 - 111 mmol/L   CO2 26 22 - 32 mmol/L   Glucose, Bld 109 (H) 70 - 99 mg/dL   BUN 11 8 - 23 mg/dL   Creatinine, Ser 1.15 (H) 0.44 - 1.00 mg/dL   Calcium 9.6 8.9 - 10.3 mg/dL   Total Protein 6.7 6.5 - 8.1 g/dL   Albumin 3.5 3.5 - 5.0 g/dL   AST 22 15 - 41 U/L   ALT 14 0 - 44 U/L   Alkaline Phosphatase 117 38 - 126 U/L   Total Bilirubin 0.7 0.3 - 1.2 mg/dL   GFR calc non Af Amer 45 (L) >60 mL/min   GFR calc Af Amer 52 (L) >60 mL/min   Anion gap 8 5 - 15  CBC  Result Value Ref Range   WBC 5.3 4.0 - 10.5 K/uL   RBC 4.04 3.87 - 5.11  MIL/uL   Hemoglobin 12.0 12.0 - 15.0 g/dL   HCT 38.0 36.0 - 46.0 %   MCV 94.1 80.0 - 100.0 fL   MCH 29.7 26.0 - 34.0 pg   MCHC 31.6 30.0 - 36.0 g/dL   RDW 13.1 11.5 - 15.5 %   Platelets 266 150 - 400 K/uL   nRBC 0.0 0.0 - 0.2 %  Urinalysis, Routine w reflex microscopic  Result Value Ref Range   Color, Urine YELLOW YELLOW   APPearance CLEAR CLEAR   Specific Gravity, Urine 1.025 1.005 - 1.030   pH 5.5 5.0 - 8.0   Glucose, UA NEGATIVE NEGATIVE mg/dL   Hgb urine dipstick SMALL (A) NEGATIVE   Bilirubin Urine NEGATIVE NEGATIVE   Ketones, ur 15 (A) NEGATIVE mg/dL   Protein, ur NEGATIVE NEGATIVE mg/dL   Nitrite NEGATIVE NEGATIVE   Leukocytes, UA SMALL (A) NEGATIVE  Urinalysis, Microscopic (reflex)  Result Value Ref Range   RBC / HPF 0-5 0 - 5 RBC/hpf   WBC, UA 21-50 0 - 5 WBC/hpf   Bacteria, UA MANY (A) NONE SEEN   Squamous Epithelial / LPF 0-5 0 - 5   Hyaline Casts, UA PRESENT    Granular Casts, UA PRESENT  Ct Abdomen Pelvis Wo Contrast  Result Date: 05/11/2018 CLINICAL DATA:  Intermittent left lower abdominal pain, nausea and diarrhea. History of left nephrectomy for renal cancer. EXAM: CT ABDOMEN AND PELVIS WITHOUT CONTRAST TECHNIQUE: Multidetector CT imaging of the abdomen and pelvis was performed following the standard protocol without IV contrast. COMPARISON:  12/19/2017 CT abdomen/pelvis. FINDINGS: Lower chest: No significant pulmonary nodules or acute consolidative airspace disease. Hepatobiliary: Normal liver size. No liver mass. Cholecystectomy. No biliary ductal dilatation. Pancreas: Normal, with no mass or duct dilation. Spleen: Normal size. No mass. Adrenals/Urinary Tract: No discrete adrenal nodules. Status post left nephrectomy. No mass or fluid collection in the left nephrectomy bed. No right hydronephrosis. No right renal stones. No contour deforming right renal mass. Normal bladder. Stomach/Bowel: Normal non-distended stomach. Stable postsurgical changes from apparent  right hemicolectomy with no dilated small bowel loops or small bowel wall thickening. No wall thickening, diverticulosis or significant pericolonic fat stranding in the remnant large-bowel. Vascular/Lymphatic: Atherosclerotic nonaneurysmal abdominal aorta. No pathologically enlarged lymph nodes in the abdomen or pelvis. Reproductive: Grossly normal uterus.  No adnexal mass. Other: No pneumoperitoneum, ascites or focal fluid collection. Musculoskeletal: No aggressive appearing focal osseous lesions. Marked degenerative changes in the lumbar spine. IMPRESSION: 1. No acute abnormality. No evidence of bowel obstruction or acute bowel inflammation. 2. Stable postsurgical changes from left nephrectomy with no evidence of local tumor recurrence or metastatic disease. 3.  Aortic Atherosclerosis (ICD10-I70.0). Electronically Signed   By: Ilona Sorrel M.D.   On: 05/11/2018 19:42     Orlie Dakin, MD 05/11/18 2316

## 2018-05-11 NOTE — ED Notes (Signed)
Water given for fluid challenge  ?

## 2018-05-11 NOTE — ED Notes (Signed)
Patient declined rectal temperature.

## 2018-05-11 NOTE — Discharge Instructions (Addendum)
Take the medication as prescribed.  See your doctor if you do not feel back to normal in a week.  Return if you vomit despite taking the medication prescribed for nausea, or do not continue to improve in 1 or 2 days.

## 2018-05-11 NOTE — ED Triage Notes (Signed)
Abd pain N/D that started last night. Pt reports 5-6 episodes of watery diarrhea in the past 24 hours.

## 2018-05-13 LAB — URINE CULTURE

## 2018-05-20 ENCOUNTER — Other Ambulatory Visit: Payer: Self-pay

## 2018-05-20 ENCOUNTER — Encounter (HOSPITAL_BASED_OUTPATIENT_CLINIC_OR_DEPARTMENT_OTHER): Payer: Self-pay | Admitting: *Deleted

## 2018-05-20 ENCOUNTER — Emergency Department (HOSPITAL_BASED_OUTPATIENT_CLINIC_OR_DEPARTMENT_OTHER)
Admission: EM | Admit: 2018-05-20 | Discharge: 2018-05-21 | Disposition: A | Discharge status: Home / Self Care | Payer: Medicare Other | Attending: Emergency Medicine | Admitting: Emergency Medicine

## 2018-05-20 DIAGNOSIS — J45909 Unspecified asthma, uncomplicated: Secondary | ICD-10-CM | POA: Insufficient documentation

## 2018-05-20 DIAGNOSIS — I10 Essential (primary) hypertension: Secondary | ICD-10-CM | POA: Insufficient documentation

## 2018-05-20 DIAGNOSIS — Z7982 Long term (current) use of aspirin: Secondary | ICD-10-CM | POA: Diagnosis not present

## 2018-05-20 DIAGNOSIS — Z950 Presence of cardiac pacemaker: Secondary | ICD-10-CM | POA: Insufficient documentation

## 2018-05-20 DIAGNOSIS — Z96652 Presence of left artificial knee joint: Secondary | ICD-10-CM | POA: Insufficient documentation

## 2018-05-20 DIAGNOSIS — E860 Dehydration: Secondary | ICD-10-CM | POA: Diagnosis not present

## 2018-05-20 DIAGNOSIS — A084 Viral intestinal infection, unspecified: Secondary | ICD-10-CM | POA: Insufficient documentation

## 2018-05-20 DIAGNOSIS — E119 Type 2 diabetes mellitus without complications: Secondary | ICD-10-CM | POA: Diagnosis not present

## 2018-05-20 DIAGNOSIS — R109 Unspecified abdominal pain: Secondary | ICD-10-CM | POA: Diagnosis present

## 2018-05-20 DIAGNOSIS — Z85528 Personal history of other malignant neoplasm of kidney: Secondary | ICD-10-CM | POA: Diagnosis not present

## 2018-05-20 DIAGNOSIS — Z7984 Long term (current) use of oral hypoglycemic drugs: Secondary | ICD-10-CM | POA: Diagnosis not present

## 2018-05-20 DIAGNOSIS — Z79899 Other long term (current) drug therapy: Secondary | ICD-10-CM | POA: Insufficient documentation

## 2018-05-20 DIAGNOSIS — Z85828 Personal history of other malignant neoplasm of skin: Secondary | ICD-10-CM | POA: Insufficient documentation

## 2018-05-20 LAB — CBC
HCT: 40.2 % (ref 36.0–46.0)
Hemoglobin: 12.7 g/dL (ref 12.0–15.0)
MCH: 29.1 pg (ref 26.0–34.0)
MCHC: 31.6 g/dL (ref 30.0–36.0)
MCV: 92.2 fL (ref 80.0–100.0)
NRBC: 0 % (ref 0.0–0.2)
PLATELETS: 296 10*3/uL (ref 150–400)
RBC: 4.36 MIL/uL (ref 3.87–5.11)
RDW: 13.2 % (ref 11.5–15.5)
WBC: 6.6 10*3/uL (ref 4.0–10.5)

## 2018-05-20 MED ORDER — SODIUM CHLORIDE 0.9 % IV BOLUS
500.0000 mL | Freq: Once | INTRAVENOUS | Status: AC
  Administered 2018-05-21: via INTRAVENOUS

## 2018-05-20 MED ORDER — ONDANSETRON HCL 4 MG/2ML IJ SOLN
4.0000 mg | Freq: Once | INTRAMUSCULAR | Status: AC
  Administered 2018-05-21: 4 mg via INTRAVENOUS
  Filled 2018-05-20: qty 2

## 2018-05-20 MED ORDER — MORPHINE SULFATE (PF) 4 MG/ML IV SOLN
4.0000 mg | Freq: Once | INTRAVENOUS | Status: AC
  Administered 2018-05-21: 4 mg via INTRAVENOUS
  Filled 2018-05-20: qty 1

## 2018-05-20 NOTE — ED Triage Notes (Signed)
Pt reports abdominal pain, diarrhea and nausea that continues. Pt seen last week for same and treated for UTI with antibiotics.

## 2018-05-21 ENCOUNTER — Emergency Department (HOSPITAL_BASED_OUTPATIENT_CLINIC_OR_DEPARTMENT_OTHER): Payer: Medicare Other

## 2018-05-21 DIAGNOSIS — A084 Viral intestinal infection, unspecified: Secondary | ICD-10-CM | POA: Diagnosis not present

## 2018-05-21 LAB — URINALYSIS, MICROSCOPIC (REFLEX)

## 2018-05-21 LAB — COMPREHENSIVE METABOLIC PANEL
ALK PHOS: 124 U/L (ref 38–126)
ALT: 16 U/L (ref 0–44)
AST: 23 U/L (ref 15–41)
Albumin: 3.9 g/dL (ref 3.5–5.0)
Anion gap: 8 (ref 5–15)
BUN: 13 mg/dL (ref 8–23)
CALCIUM: 9.4 mg/dL (ref 8.9–10.3)
CHLORIDE: 112 mmol/L — AB (ref 98–111)
CO2: 19 mmol/L — AB (ref 22–32)
CREATININE: 1.29 mg/dL — AB (ref 0.44–1.00)
GFR calc non Af Amer: 39 mL/min — ABNORMAL LOW (ref >60)
GFR, EST AFRICAN AMERICAN: 45 mL/min — AB (ref >60)
Glucose, Bld: 100 mg/dL — ABNORMAL HIGH (ref 70–99)
Potassium: 3.6 mmol/L (ref 3.5–5.1)
Sodium: 139 mmol/L (ref 135–145)
Total Bilirubin: 1 mg/dL (ref 0.3–1.2)
Total Protein: 7.4 g/dL (ref 6.5–8.1)

## 2018-05-21 LAB — URINALYSIS, ROUTINE W REFLEX MICROSCOPIC
BILIRUBIN URINE: NEGATIVE
GLUCOSE, UA: NEGATIVE mg/dL
KETONES UR: NEGATIVE mg/dL
NITRITE: NEGATIVE
PH: 6 (ref 5.0–8.0)
Protein, ur: NEGATIVE mg/dL
SPECIFIC GRAVITY, URINE: 1.025 (ref 1.005–1.030)

## 2018-05-21 LAB — LIPASE, BLOOD: Lipase: 61 U/L — ABNORMAL HIGH (ref 11–51)

## 2018-05-21 MED ORDER — MORPHINE SULFATE (PF) 4 MG/ML IV SOLN
4.0000 mg | Freq: Once | INTRAVENOUS | Status: AC
  Administered 2018-05-21: 4 mg via INTRAVENOUS
  Filled 2018-05-21: qty 1

## 2018-05-21 MED ORDER — DICYCLOMINE HCL 10 MG PO CAPS
10.0000 mg | ORAL_CAPSULE | Freq: Once | ORAL | Status: AC
  Administered 2018-05-21: 10 mg via ORAL
  Filled 2018-05-21: qty 1

## 2018-05-21 MED ORDER — ONDANSETRON 4 MG PO TBDP: 4.0000 mg | ORAL_TABLET | Freq: Three times a day (TID) | ORAL | 0 refills | Status: DC | PRN

## 2018-05-21 NOTE — ED Provider Notes (Signed)
[facility] EMERGENCY DEPARTMENT Provider Note   CSN: [id number] Arrival date & time: 05/20/18  2324     History   Chief Complaint Chief Complaint  Patient presents with  . Nausea    HPI TELESA JEANCHARLES is a 77 y.o. female.  HPI  This is a 77 year old female with a history of diabetes, asthma, hypercholesterolemia, peptic ulcer, urinary tract infections who presents with abdominal pain and nausea and vomiting.  Patient reports worsening of symptoms since last night.  Pain was worse with eating.  She reports crampy upper and lower abdominal pain.  It is nonradiating.  She rates her pain at 10 out of 10.  She has not taken anything for her pain.  She was seen and evaluated approximately 1 week ago and found to have urinary tract infection.  She reports that she finished her course of antibiotics.  She has not had any urinary symptoms.  Denies any fevers.  Denies any sick contacts.  Patient does report some loose stools.  No bloody stools.   Past Medical History:  Diagnosis Date  . Anemia   . Arthritis   . Asthma   . Cancer [facility])    History of Renal Cancer  . Chronic back pain   . Depression   . Diabetes mellitus without complication ([place])   . Excessive somnolence disorder   . Family history of adverse reaction to anesthesia    son has problems with nausea   . GERD (gastroesophageal reflux disease)   . Glaucoma    pt denies on preop visit of 01/03/2015   . Headache   . Heart murmur   . High cholesterol   . History of hiatal hernia   . Hypertension   . Osteoporosis   . Peptic ulcer   . Renal disorder    only one kidney   . Sleep apnea    cpap- does not know settings   . Urinary tract infection    hx     Patient Active Problem List   Diagnosis Date Noted  . 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 Hypertension 11/12/2015  . GERD (gastroesophageal reflux disease) 11/12/2015  . Diabetes mellitus without complication ([place]) 40/98/1191  .  Vitamin D deficiency 11/12/2015  . High cholesterol 11/12/2015  . Depression 11/12/2015  . Chronic pain syndrome 11/12/2015  . Asthma 11/12/2015  . Sublingual gland swelling 01/05/2015  . Prolapsed sublingual gland 12/30/2014    Past Surgical History:  Procedure Laterality Date  . bowel obstruction surgery      x 2   . Carpule Tunnel Release Bilateral   . CHOLECYSTECTOMY    . JOINT REPLACEMENT     left knee replacement   . KNEE ARTHROCENTESIS     left knee  . NEPHRECTOMY    . PACEMAKER IMPLANT    . Removal of Skin Cancer  N/A    Forehead  . SKIN SPLIT GRAFT N/A 01/05/2015   Procedure: MANDIBULAR LINGUAL SPLIT THICKNESS SKIN GRAFT VESTIVULOPLASTY;  Surgeon: Michaela Corner, DDS;  Location: [facility] ORS;  Service: Oral Surgery;  Laterality: N/A;     OB History    Gravida  7   Para  7   Term  6   Preterm  1   AB      Living  5     SAB      TAB      Ectopic      Multiple      Live Births  Home Medications    Prior to Admission medications   Medication Sig Start Date End Date Taking? Authorizing Provider  acetaminophen (TYLENOL) 500 MG tablet Take 1,000 mg by mouth every 6 (six) hours as needed.  11/04/15   [provider]  albuterol (PROVENTIL HFA) 108 (90 Base) MCG/ACT inhaler 2 puffs every 6 (six) hours as needed.  09/26/15   [provider]  aspirin EC 81 MG tablet Take 81 mg by mouth daily.     [provider]  atorvastatin (LIPITOR) 20 MG tablet Take 20 mg by mouth daily.     [provider]  calcium carbonate (TUMS - DOSED IN MG ELEMENTAL CALCIUM) 500 MG chewable tablet Chew 2 tablets by mouth every morning.     [provider]  cephALEXin (KEFLEX) 500 MG capsule Take 1 capsule (500 mg total) by mouth 2 (two) times daily. 05/11/18   Orlie Dakin, MD  cephALEXin (KEFLEX) 500 MG capsule Take 1 capsule (500 mg total) by mouth 2 (two) times daily. 05/11/18   Orlie Dakin, MD  chlorhexidine (PERIDEX)  0.12 % solution 15 mLs. 12/02/14   [provider]  Cholecalciferol (VITAMIN D3 SUPER STRENGTH) 2000 units TABS Take 2,000 Units by mouth daily.     [provider]  cyclobenzaprine (FLEXERIL) 10 MG tablet Take 10 mg by mouth daily as needed.  05/23/13   [provider]  escitalopram (LEXAPRO) 10 MG tablet 10 mg daily.  09/09/15   [provider]  fluticasone (FLONASE) 50 MCG/ACT nasal spray Place 2 sprays into both nostrils daily.  08/12/15   [provider]  furosemide (LASIX) 40 MG tablet Take 40 mg by mouth daily.     [provider]  lidocaine (LIDODERM) 5 % Place onto the skin. 09/01/15   [provider]  lisinopril (PRINIVIL,ZESTRIL) 5 MG tablet Take 5 mg by mouth daily.    [provider]  metFORMIN (GLUCOPHAGE-XR) 500 MG 24 hr tablet Take 500 mg by mouth daily before supper.    [provider]  methadone (DOLOPHINE) 10 MG tablet Take 5 mg by mouth at bedtime.  10/04/15   [provider]  mometasone-formoterol (DULERA) 200-5 MCG/ACT AERO Inhale 2 puffs into the lungs 2 (two) times daily as needed.     [provider]  Multiple Vitamin (MULTIVITAMIN) tablet Take by mouth.    [provider]  Omega-3 Fatty Acids (FISH OIL) 1000 MG CAPS Take 1 capsule by mouth daily.    [provider]  ondansetron (ZOFRAN ODT) 4 MG disintegrating tablet Take 1 tablet (4 mg total) by mouth every 8 (eight) hours as needed for nausea or vomiting. 05/21/18   Horton, Barbette Hair, MD  ondansetron (ZOFRAN) 4 MG tablet Take 4 mg by mouth every 6 (six) hours as needed.  12/07/14   [provider]  Oxycodone HCl 20 MG TABS Take 20 mg by mouth every 4 (four) hours as needed.  11/04/15   [provider]  pantoprazole (PROTONIX) 40 MG tablet Take 40 mg by mouth. 12/01/14   [provider]  potassium chloride (K-DUR,KLOR-CON) 10 MEQ tablet 20 mEq 2 (two) times daily.  07/20/15   [provider]  silver sulfADIAZINE (SILVADENE) 1 % cream Apply 1 application topically 2 (two) times daily as needed (rash).     [provider]    Family History History reviewed. No pertinent family history.  Social History Social History   Tobacco Use  . Smoking status: Never  Smoker  . Smokeless tobacco: Never Used  Substance Use Topics  . Alcohol use: No  . Drug use: No     Allergies   Gabapentin; Fluoxetine; Naproxen; Reglan [metoclopramide]; Tizanidine; and Ivp dye [iodinated diagnostic agents]   Review of Systems Review of Systems  Constitutional: Negative for fever.  Respiratory: Negative for shortness of breath.   Cardiovascular: Negative for chest pain.  Gastrointestinal: Positive for abdominal pain, diarrhea and nausea. Negative for blood in stool.  Genitourinary: Negative for dysuria.  Skin: Negative for color change.  All other systems reviewed and are negative.    Physical Exam Updated Vital Signs BP 135/75   Pulse 72   Temp 98.3 F (36.8 C)   Resp 15   Ht 1.626 m (5\' 4" )   Wt 96.2 kg   SpO2 96%   BMI 36.39 kg/m   Physical Exam  Constitutional: She is oriented to person, place, and time.  Uncomfortable appearing, elderly  HENT:  Head: Normocephalic and atraumatic.  Eyes: Pupils are equal, round, and reactive to light.  Cardiovascular: Normal rate, regular rhythm and normal heart sounds.  No murmur heard. Pulmonary/Chest: Effort normal and breath sounds normal. No respiratory distress. She has no wheezes.  Abdominal: Soft. There is tenderness. There is no rebound and no guarding.  Poor active bowel sounds, diffuse tenderness to palpation, no rebound or guarding  Musculoskeletal: She exhibits no edema.  Neurological: She is alert and oriented to person, place, and time.  Skin: Skin is warm and dry.  Psychiatric: She has a normal mood and affect.  Nursing note and vitals reviewed.    ED Treatments / Results  Labs (all labs  ordered are listed, but only abnormal results are displayed) Labs Reviewed  LIPASE, BLOOD - Abnormal; Notable for the following components:      Result Value   Lipase 61 (*)    All other components within normal limits  COMPREHENSIVE METABOLIC PANEL - Abnormal; Notable for the following components:   Chloride 112 (*)    CO2 19 (*)    Glucose, Bld 100 (*)    Creatinine, Ser 1.29 (*)    GFR calc non Af Amer 39 (*)    GFR calc Af Amer 45 (*)    All other components within normal limits  URINALYSIS, ROUTINE W REFLEX MICROSCOPIC - Abnormal; Notable for the following components:   Hgb urine dipstick SMALL (*)    Leukocytes, UA TRACE (*)    All other components within normal limits  URINALYSIS, MICROSCOPIC (REFLEX) - Abnormal; Notable for the following components:   Bacteria, UA FEW (*)    All other components within normal limits  CBC    EKG EKG Interpretation  Date/Time:  Wednesday May 21 2018 00:09:45 EDT Ventricular Rate:  85 PR Interval:    QRS Duration: 94 QT Interval:  373 QTC Calculation: 444 R Axis:   28 Text Interpretation:  Sinus rhythm Confirmed by Thayer Jew [phone]) on 05/21/2018 12:16:40 AM   Radiology Ct Abdomen Pelvis Wo Contrast  Result Date: 05/21/2018 CLINICAL DATA:  Nausea and vomiting. Patient reports abdominal pain for 2 years. EXAM: CT ABDOMEN AND PELVIS WITHOUT CONTRAST TECHNIQUE: Multidetector CT imaging of the abdomen and pelvis was performed following the standard protocol without IV contrast. COMPARISON:  CT 10 days prior 05/11/2018, additional priors reviewed. FINDINGS: Lower chest: Chest port tip the atrial caval junction. No consolidation or pleural fluid. Pacemaker partially included. Hepatobiliary: No focal hepatic abnormality. Postcholecystectomy without biliary dilatation. No change from  prior exam. Pancreas: No ductal dilatation or inflammation. Spleen: Normal in size without focal abnormality. Adrenals/Urinary Tract: Prior left  nephrectomy. No abnormal soft tissue density in the nephrectomy bed. No adrenal nodule. Right kidney is unremarkable. No perinephric edema or hydronephrosis. Urinary bladder is nondistended, no bladder wall thickening. Stomach/Bowel: Stomach physiologically distended. Administered enteric contrast reaches the mid small bowel. No bowel dilatation to suggest obstruction. Apparent right hemicolectomy. Mixed liquid and solid stool throughout the colon without colonic wall thickening or inflammatory change. Vascular/Lymphatic: Aorta bi-iliac atherosclerosis. No enlarged lymph nodes in the abdomen or pelvis. Reproductive: Uterus and bilateral adnexa are unremarkable. Other: No free air, free fluid, or intra-abdominal fluid collection. Postsurgical change of the anterior abdominal wall. Musculoskeletal: Scoliosis and degenerative change throughout the spine. Anterolisthesis of L5 on S1 is unchanged, likely facet mediated. Pubic symphyseal degenerative change as well as bilateral hip osteoarthritis. IMPRESSION: 1. Mixed liquid and solid stool throughout the colon can be seen with diarrheal process. No bowel inflammation or obstruction. 2. No other acute findings in the abdomen or pelvis. 3.  Aortic Atherosclerosis (ICD10-I70.0). Electronically Signed   By: Keith Rake M.D.   On: 05/21/2018 02:53    Procedures Procedures (including critical care time)  Medications Ordered in ED Medications  morphine 4 MG/ML injection 4 mg (4 mg Intravenous Given 05/21/18 0005)  ondansetron (ZOFRAN) injection 4 mg (4 mg Intravenous Given 05/21/18 0004)  sodium chloride 0.9 % bolus 500 mL ( Intravenous Stopped 05/21/18 0040)  morphine 4 MG/ML injection 4 mg (4 mg Intravenous Given 05/21/18 0141)  dicyclomine (BENTYL) capsule 10 mg (10 mg Oral Given 05/21/18 0322)     Initial Impression / Assessment and Plan / ED Course  I have reviewed the triage vital signs and the nursing notes.  Pertinent labs & imaging results that  were available during my care of the patient were reviewed by me and considered in my medical decision making (see chart for details).     Patient presents with abdominal pain, nausea, diarrhea.  She is uncomfortable appearing but nontoxic.  She has diffuse tenderness on exam and hyperactive bowel sounds.  She does have a history of abdominal surgeries.  This would put her at risk for SBO.  Patient was seen and evaluated 1 week ago and diagnosed with a UTI.  She did have a CT scan at that time that was unremarkable.  Patient was given pain and nausea medication as well as fluids.  Lab work obtained.  Mild AKI which likely represents some dehydration.  Otherwise lab work was baseline.  Patient continued to have abdominal pain despite appropriate medical management.  For this reason, repeat CT scan was obtained.  Repeat CT scan shows diarrheal state with mixed liquid and solid stool throughout the colon.  Feel this is likely suggestive of a enteritis or gastroenteritis.  Likely viral in nature.  Patient was given Bentyl.  She reports persistent nausea but is able to tolerate fluids.  Will discharge home with Zofran.  After history, exam, and medical workup I feel the patient has been appropriately medically screened and is safe for discharge home. Pertinent diagnoses were discussed with the patient. Patient was given return precautions.   Final Clinical Impressions(s) / ED Diagnoses   Final diagnoses:  Viral gastroenteritis  Dehydration    ED Discharge Orders         Ordered    ondansetron (ZOFRAN ODT) 4 MG disintegrating tablet  Every 8 hours PRN     05/21/18 0400  Merryl Hacker, MD 05/21/18 [id number]

## 2018-05-21 NOTE — ED Notes (Signed)
PO challenge started

## 2018-05-21 NOTE — Discharge Instructions (Addendum)
You were seen today for abdominal pain and nausea.  Your work-up supports a diagnosis of likely enteritis or gastroenteritis.  Take nausea medication as prescribed.  Start with clear liquid diet.  Follow-up with your primary doctor.  Make sure that you are staying hydrated.

## 2018-05-26 DIAGNOSIS — G8929 Other chronic pain: Secondary | ICD-10-CM | POA: Insufficient documentation

## 2018-09-05 DIAGNOSIS — E782 Mixed hyperlipidemia: Secondary | ICD-10-CM | POA: Insufficient documentation

## 2018-09-05 DIAGNOSIS — G2581 Restless legs syndrome: Secondary | ICD-10-CM | POA: Insufficient documentation

## 2018-09-05 DIAGNOSIS — E66812 Obesity, class 2: Secondary | ICD-10-CM | POA: Insufficient documentation

## 2018-09-05 DIAGNOSIS — E1122 Type 2 diabetes mellitus with diabetic chronic kidney disease: Secondary | ICD-10-CM | POA: Insufficient documentation

## 2019-01-29 DIAGNOSIS — F325 Major depressive disorder, single episode, in full remission: Secondary | ICD-10-CM | POA: Insufficient documentation

## 2019-02-18 ENCOUNTER — Emergency Department (HOSPITAL_BASED_OUTPATIENT_CLINIC_OR_DEPARTMENT_OTHER)
Admission: EM | Admit: 2019-02-18 | Discharge: 2019-02-18 | Disposition: A | Discharge status: Home / Self Care | Payer: Medicare Other | Attending: Emergency Medicine | Admitting: Emergency Medicine

## 2019-02-18 ENCOUNTER — Other Ambulatory Visit: Payer: Self-pay

## 2019-02-18 ENCOUNTER — Encounter (HOSPITAL_BASED_OUTPATIENT_CLINIC_OR_DEPARTMENT_OTHER): Payer: Self-pay

## 2019-02-18 ENCOUNTER — Emergency Department (HOSPITAL_BASED_OUTPATIENT_CLINIC_OR_DEPARTMENT_OTHER): Payer: Medicare Other

## 2019-02-18 DIAGNOSIS — S6991XA Unspecified injury of right wrist, hand and finger(s), initial encounter: Secondary | ICD-10-CM | POA: Diagnosis present

## 2019-02-18 DIAGNOSIS — Y9389 Activity, other specified: Secondary | ICD-10-CM | POA: Insufficient documentation

## 2019-02-18 DIAGNOSIS — Z79899 Other long term (current) drug therapy: Secondary | ICD-10-CM | POA: Insufficient documentation

## 2019-02-18 DIAGNOSIS — E119 Type 2 diabetes mellitus without complications: Secondary | ICD-10-CM | POA: Insufficient documentation

## 2019-02-18 DIAGNOSIS — Y92009 Unspecified place in unspecified non-institutional (private) residence as the place of occurrence of the external cause: Secondary | ICD-10-CM | POA: Diagnosis not present

## 2019-02-18 DIAGNOSIS — I10 Essential (primary) hypertension: Secondary | ICD-10-CM | POA: Insufficient documentation

## 2019-02-18 DIAGNOSIS — S4991XA Unspecified injury of right shoulder and upper arm, initial encounter: Secondary | ICD-10-CM | POA: Insufficient documentation

## 2019-02-18 DIAGNOSIS — W010XXA Fall on same level from slipping, tripping and stumbling without subsequent striking against object, initial encounter: Secondary | ICD-10-CM | POA: Diagnosis not present

## 2019-02-18 DIAGNOSIS — Y998 Other external cause status: Secondary | ICD-10-CM | POA: Insufficient documentation

## 2019-02-18 DIAGNOSIS — W19XXXA Unspecified fall, initial encounter: Secondary | ICD-10-CM

## 2019-02-18 DIAGNOSIS — M25531 Pain in right wrist: Secondary | ICD-10-CM

## 2019-02-18 DIAGNOSIS — M25511 Pain in right shoulder: Secondary | ICD-10-CM

## 2019-02-18 NOTE — ED Notes (Signed)
ED Provider at bedside. 

## 2019-02-18 NOTE — ED Triage Notes (Signed)
Pt states she tripped/fell yesterday-pain to right hand and right UE-NAD-to triage in w/c

## 2019-02-18 NOTE — ED Provider Notes (Signed)
[facility] EMERGENCY DEPARTMENT Provider Note   CSN: [id number] Arrival date & time: 02/18/19  1403    History   Chief Complaint Chief Complaint  Patient presents with   Fall    HPI Cathy Richardson is a 78 y.o. female with PMHx arthritis, chronic back pain, DM, GERD, HTN, who presents to the ED today complaining of gradual onset, constant, achy, right hand pain and right shoulder pain s/p mechanical fall that occurred yesterday. Pt reports that she tripped on a rug and fell. She attempted to catch herself with her outstretched hands but slid down to the floor. No head injury or LOC. She reports that hours later she noticed her hand began to swell. She has not taken anything specifically for the pain. Pt takes Tramadol daily for her chronic back pain but reports it has not helped much with this pain. She attempted to call her neurologist yesterday requesting something for pain; believes he may have sent something to her electronic pharmacy. Denies any other associated symptoms. Pt is not anticoagulated.       Past Medical History:  Diagnosis Date   Anemia    Arthritis    Asthma    Cancer ([place])    History of Renal Cancer   Chronic back pain    Depression    Diabetes mellitus without complication (HCC)    Excessive somnolence disorder    Family history of adverse reaction to anesthesia    son has problems with nausea    GERD (gastroesophageal reflux disease)    Glaucoma    pt denies on preop visit of 01/03/2015    Headache    Heart murmur    High cholesterol    History of hiatal hernia    Hypertension    Osteoporosis    Peptic ulcer    Renal disorder    only one kidney    Sleep apnea    cpap- does not know settings    Urinary tract infection    hx     Patient Active Problem List   Diagnosis Date Noted   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   Hypertension 11/12/2015   GERD (gastroesophageal reflux disease) 11/12/2015     Diabetes mellitus without complication ([place]) 55/73/2202   Vitamin D deficiency 11/12/2015   High cholesterol 11/12/2015   Depression 11/12/2015   Chronic pain syndrome 11/12/2015   Asthma 11/12/2015   Sublingual gland swelling 01/05/2015   Prolapsed sublingual gland 12/30/2014    Past Surgical History:  Procedure Laterality Date   bowel obstruction surgery      x 2    Carpule Tunnel Release Bilateral    CHOLECYSTECTOMY     JOINT REPLACEMENT     left knee replacement    KNEE ARTHROCENTESIS     left knee   NEPHRECTOMY     PACEMAKER IMPLANT     Removal of Skin Cancer  N/A    Forehead   SKIN SPLIT GRAFT N/A 01/05/2015   Procedure: MANDIBULAR LINGUAL SPLIT THICKNESS SKIN GRAFT VESTIVULOPLASTY;  Surgeon: Michaela Corner, DDS;  Location: [facility];  Service: Oral Surgery;  Laterality: N/A;     OB History    Gravida  7   Para  7   Term  6   Preterm  1   AB      Living  5     SAB      TAB      Ectopic      Multiple  Live Births               Home Medications    Prior to Admission medications   Medication Sig Start Date End Date Taking? Authorizing Provider  acetaminophen (TYLENOL) 500 MG tablet Take 1,000 mg by mouth every 6 (six) hours as needed.  11/04/15   [provider]  albuterol (PROVENTIL HFA) 108 (90 Base) MCG/ACT inhaler 2 puffs every 6 (six) hours as needed.  09/26/15   [provider]  aspirin EC 81 MG tablet Take 81 mg by mouth daily.     [provider]  atorvastatin (LIPITOR) 20 MG tablet Take 20 mg by mouth daily.     [provider]  calcium carbonate (TUMS - DOSED IN MG ELEMENTAL CALCIUM) 500 MG chewable tablet Chew 2 tablets by mouth every morning.     [provider]  cephALEXin (KEFLEX) 500 MG capsule Take 1 capsule (500 mg total) by mouth 2 (two) times daily. 05/11/18   Orlie Dakin, MD  cephALEXin (KEFLEX) 500 MG capsule Take 1 capsule (500 mg total) by mouth 2 (two) times  daily. 05/11/18   Orlie Dakin, MD  chlorhexidine (PERIDEX) 0.12 % solution 15 mLs. 12/02/14   [provider]  Cholecalciferol (VITAMIN D3 SUPER STRENGTH) 2000 units TABS Take 2,000 Units by mouth daily.     [provider]  cyclobenzaprine (FLEXERIL) 10 MG tablet Take 10 mg by mouth daily as needed.  05/23/13   [provider]  escitalopram (LEXAPRO) 10 MG tablet 10 mg daily.  09/09/15   [provider]  fluticasone (FLONASE) 50 MCG/ACT nasal spray Place 2 sprays into both nostrils daily.  08/12/15   [provider]  furosemide (LASIX) 40 MG tablet Take 40 mg by mouth daily.     [provider]  lidocaine (LIDODERM) 5 % Place onto the skin. 09/01/15   [provider]  lisinopril (PRINIVIL,ZESTRIL) 5 MG tablet Take 5 mg by mouth daily.    [provider]  metFORMIN (GLUCOPHAGE-XR) 500 MG 24 hr tablet Take 500 mg by mouth daily before supper.    [provider]  methadone (DOLOPHINE) 10 MG tablet Take 5 mg by mouth at bedtime.  10/04/15   [provider]  mometasone-formoterol (DULERA) 200-5 MCG/ACT AERO Inhale 2 puffs into the lungs 2 (two) times daily as needed.     [provider]  Multiple Vitamin (MULTIVITAMIN) tablet Take by mouth.    [provider]  Omega-3 Fatty Acids (FISH OIL) 1000 MG CAPS Take 1 capsule by mouth daily.    [provider]  ondansetron (ZOFRAN ODT) 4 MG disintegrating tablet Take 1 tablet (4 mg total) by mouth every 8 (eight) hours as needed for nausea or vomiting. 05/21/18   Horton, Barbette Hair, MD  ondansetron (ZOFRAN) 4 MG tablet Take 4 mg by mouth every 6 (six) hours as needed.  12/07/14   [provider]  Oxycodone HCl 20 MG TABS Take 20 mg by mouth every 4 (four) hours as needed.  11/04/15   [provider]  pantoprazole (PROTONIX) 40 MG tablet Take 40 mg by mouth. 12/01/14   [provider]  potassium chloride (K-DUR,KLOR-CON) 10 MEQ  tablet 20 mEq 2 (two) times daily.  07/20/15   [provider]  silver sulfADIAZINE (SILVADENE) 1 % cream Apply 1 application topically 2 (two) times daily as needed (rash).     [provider]    Family History No family history on  file.  Social History Social History   Tobacco Use   Smoking status: Never Smoker   Smokeless tobacco: Never Used  Substance Use Topics   Alcohol use: No   Drug use: No     Allergies   Gabapentin, Fluoxetine, Naproxen, Reglan [metoclopramide], Tizanidine, and Ivp dye [iodinated diagnostic agents]   Review of Systems Review of Systems  Constitutional: Negative for chills and fever.  Musculoskeletal: Positive for arthralgias and joint swelling.  Skin: Negative for wound.  Neurological: Negative for syncope and headaches.     Physical Exam Updated Vital Signs BP 131/71 (BP Location: Left Arm)    Pulse 64    Temp 98.7 F (37.1 C) (Oral)    Resp 20    Ht 5\' 3"  (1.6 m)    Wt 96.2 kg    SpO2 97%    BMI 37.55 kg/m   Physical Exam Vitals signs and nursing note reviewed.  Constitutional:      Appearance: She is not ill-appearing.  HENT:     Head: Normocephalic and atraumatic.  Eyes:     Conjunctiva/sclera: Conjunctivae normal.  Cardiovascular:     Rate and Rhythm: Normal rate and regular rhythm.  Pulmonary:     Effort: Pulmonary effort is normal.     Breath sounds: Normal breath sounds.  Musculoskeletal:     Comments: No C, T, or L midline spinal tenderness.   Right hand with increased swelling compared to left and slight erythema without increased warmth; no tenderness to anatomical snuff box; tenderness present along distal radius; ecchymosis noted to right shoulder with mild tenderness to palpation; ROM intact throughout upper extremity; cap refill < 2 seconds; strength 4/5 with grip strength due to pain; 2+ radial pulse  Skin:    General: Skin is warm and dry.     Coloration: Skin is not jaundiced.  Neurological:       Mental Status: She is alert.      ED Treatments / Results  Labs (all labs ordered are listed, but only abnormal results are displayed) Labs Reviewed - No data to display  EKG None  Radiology Dg Shoulder Right  Result Date: 02/18/2019 CLINICAL DATA:  Fall today on outstretched hand with shoulder pain, initial encounter EXAM: RIGHT SHOULDER - 2+ VIEW COMPARISON:  None. FINDINGS: Mild degenerative changes of the acromioclavicular joint are seen. No acute fracture or dislocation is noted. No soft tissue abnormality is seen. The underlying bony thorax is within normal limits. IMPRESSION: No acute abnormality noted. Electronically Signed   By: Inez Catalina M.D.   On: 02/18/2019 15:22   Dg Wrist Complete Right  Result Date: 02/18/2019 CLINICAL DATA:  Right wrist pain after fall. EXAM: RIGHT WRIST - COMPLETE 3+ VIEW; RIGHT HAND - COMPLETE 3+ VIEW COMPARISON:  Right hand x-rays dated August 20, 2011. FINDINGS: No acute fracture or dislocation. 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 Unchanged mild osteoarthritis of the first MCP joint, first IP joint, and scaphotrapeziotrapezoid joint. Small marginal osteophytes of the IP joints again noted. Diffuse osteopenia. Soft tissues are unremarkable. IMPRESSION: 1. No acute osseous abnormality of the right hand and wrist. 2. Unchanged osteoarthritis. Electronically Signed   By: Titus Dubin M.D.   On: 02/18/2019 15:24   Dg Hand Complete Right  Result Date: 02/18/2019 CLINICAL DATA:  Right wrist pain after fall. EXAM: RIGHT WRIST - COMPLETE 3+ VIEW; RIGHT HAND - COMPLETE 3+ VIEW COMPARISON:  Right hand x-rays dated August 20, 2011. FINDINGS: No acute  fracture or dislocation. 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 Unchanged mild osteoarthritis of the first MCP joint, first IP joint, and scaphotrapeziotrapezoid joint. Small marginal osteophytes of the IP joints again noted. Diffuse osteopenia.  Soft tissues are unremarkable. IMPRESSION: 1. No acute osseous abnormality of the right hand and wrist. 2. Unchanged osteoarthritis. Electronically Signed   By: Titus Dubin M.D.   On: 02/18/2019 15:24    Procedures Procedures (including critical care time)  Medications Ordered in ED Medications - No data to display   Initial Impression / Assessment and Plan / ED Course  I have reviewed the triage vital signs and the nursing notes.  Pertinent labs & imaging results that were available during my care of the patient were reviewed by me and considered in my medical decision making (see chart for details).    78 year old female presenting with RUE pain s/p mechanical fall that occurred yesterday. No head injury or LOC. Pt is not anticoagulated. She has tenderness to right shoulder, right wrist, and right hand. Will obtain xrays today to ensure there are no fractures.   Xrays reassuring today without signs of fracture per my reading; agree with radiologist read. Pt's neurologist Dr. Ermalene Postin prescribed Diclofenac for her, currently at pt's pharmacy. Will have her take this for pain and follow up with her PCP. She is in agreement with plan at this time and stable for discharge home.        Final Clinical Impressions(s) / ED Diagnoses   Final diagnoses:  Fall, initial encounter  Acute pain of right shoulder  Right wrist pain    ED Discharge Orders    None       Eustaquio Maize, PA-C 02/18/19 2118    Maudie Flakes, MD 02/19/19 0700

## 2019-02-18 NOTE — Discharge Instructions (Signed)
You were seen in the ED today after a fall Your xrays were negative for fractures today Please pick up medication prescribed by Dr. Ermalene Postin for your pain Rest, ice, and elevate your arm to reduce swelling and inflammation Please follow up with your PCP

## 2019-08-29 ENCOUNTER — Encounter (HOSPITAL_COMMUNITY): Payer: Self-pay | Admitting: Pharmacy Technician

## 2019-08-29 ENCOUNTER — Ambulatory Visit: Payer: Medicare Other | Attending: Internal Medicine

## 2019-08-29 ENCOUNTER — Other Ambulatory Visit: Payer: Self-pay

## 2019-08-29 ENCOUNTER — Ambulatory Visit: Payer: Medicare Other

## 2019-08-29 ENCOUNTER — Emergency Department (HOSPITAL_COMMUNITY)
Admission: EM | Admit: 2019-08-29 | Discharge: 2019-08-29 | Disposition: A | Discharge status: Home / Self Care | Payer: Medicare Other | Attending: Emergency Medicine | Admitting: Emergency Medicine

## 2019-08-29 DIAGNOSIS — Z85528 Personal history of other malignant neoplasm of kidney: Secondary | ICD-10-CM | POA: Diagnosis not present

## 2019-08-29 DIAGNOSIS — Z7982 Long term (current) use of aspirin: Secondary | ICD-10-CM | POA: Insufficient documentation

## 2019-08-29 DIAGNOSIS — Z7984 Long term (current) use of oral hypoglycemic drugs: Secondary | ICD-10-CM | POA: Diagnosis not present

## 2019-08-29 DIAGNOSIS — Z23 Encounter for immunization: Secondary | ICD-10-CM | POA: Insufficient documentation

## 2019-08-29 DIAGNOSIS — R5383 Other fatigue: Secondary | ICD-10-CM | POA: Insufficient documentation

## 2019-08-29 DIAGNOSIS — Z79899 Other long term (current) drug therapy: Secondary | ICD-10-CM | POA: Diagnosis not present

## 2019-08-29 DIAGNOSIS — E119 Type 2 diabetes mellitus without complications: Secondary | ICD-10-CM | POA: Diagnosis not present

## 2019-08-29 DIAGNOSIS — I10 Essential (primary) hypertension: Secondary | ICD-10-CM | POA: Diagnosis not present

## 2019-08-29 LAB — CBG MONITORING, ED
Glucose-Capillary: 89 mg/dL (ref 70–99)
Glucose-Capillary: 107 mg/dL — ABNORMAL HIGH (ref 70–99)

## 2019-08-29 NOTE — ED Notes (Signed)
Pt ambulatory to the restroom with steady gait.

## 2019-08-29 NOTE — ED Triage Notes (Signed)
Pt bib ems after getting first dose of covid vaccine today at 1428. Pt states approx 20 minutes after receiving the injection she became "hot and jittery"/ pt has been lethargic ever since. Pt alert and oriented X4. CGB 106.VS unremarkable with EMS.

## 2019-08-29 NOTE — ED Notes (Signed)
[phone] daughter is sitting in the parking lot please call her .

## 2019-08-29 NOTE — ED Provider Notes (Signed)
[facility] EMERGENCY DEPARTMENT Provider Note   CSN: [id number] Arrival date & time: 08/29/19  1636     History Chief Complaint  Patient presents with  . Fatigue    Cathy Richardson is a 79 y.o. female.  79 year old female with extensive past medical history below who presents with fatigue.  Today at about 2:30pm, pt had first dose of COVID-19 vaccine.  Approximately 20 minutes after receiving it, she began feeling hot and jittery.  She then began feeling fatigued.  She states that it feels like when you get a dose of IV pain medicine in the hospital and are coming off of that medication, like a grogginess feeling.  She has had no chest pain, shortness of breath, rash, hives, vomiting, abdominal pain, or swelling.  She notes that she did not eat much today.  Earlier she felt like her blood sugar was low so she ate cookies and juice.  When she got to the vaccine sites, she had her blood sugar checked and it was 110. No recent illness.  The history is provided by the patient.       Past Medical History:  Diagnosis Date  . Anemia   . Arthritis   . Asthma   . Cancer [facility])    History of Renal Cancer  . Chronic back pain   . Depression   . Diabetes mellitus without complication ([place])   . Excessive somnolence disorder   . Family history of adverse reaction to anesthesia    son has problems with nausea   . GERD (gastroesophageal reflux disease)   . Glaucoma    pt denies on preop visit of 01/03/2015   . Headache   . Heart murmur   . High cholesterol   . History of hiatal hernia   . Hypertension   . Osteoporosis   . Peptic ulcer   . Renal disorder    only one kidney   . Sleep apnea    cpap- does not know settings   . Urinary tract infection    hx     Patient Active Problem List   Diagnosis Date Noted  . 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 Hypertension 11/12/2015  . GERD (gastroesophageal reflux disease) 11/12/2015  . Diabetes mellitus  without complication ([place]) [id number]  . Vitamin D deficiency 11/12/2015  . High cholesterol 11/12/2015  . Depression 11/12/2015  . Chronic pain syndrome 11/12/2015  . Asthma 11/12/2015  . Sublingual gland swelling 01/05/2015  . Prolapsed sublingual gland 12/30/2014    Past Surgical History:  Procedure Laterality Date  . bowel obstruction surgery      x 2   . Carpule Tunnel Release Bilateral   . CHOLECYSTECTOMY    . JOINT REPLACEMENT     left knee replacement   . KNEE ARTHROCENTESIS     left knee  . NEPHRECTOMY    . PACEMAKER IMPLANT    . Removal of Skin Cancer  N/A    Forehead  . SKIN SPLIT GRAFT N/A 01/05/2015   Procedure: MANDIBULAR LINGUAL SPLIT THICKNESS SKIN GRAFT VESTIVULOPLASTY;  Surgeon: Michaela Corner, DDS;  Location: [facility];  Service: Oral Surgery;  Laterality: N/A;     OB History    Gravida  7   Para  7   Term  6   Preterm  1   AB      Living  5     SAB      TAB  Ectopic      Multiple      Live Births              No family history on file.  Social History   Tobacco Use  . Smoking status: Never Smoker  . Smokeless tobacco: Never Used  Substance Use Topics  . Alcohol use: No  . Drug use: No    Home Medications Prior to Admission medications   Medication Sig Start Date End Date Taking? Authorizing Provider  acetaminophen (TYLENOL) 500 MG tablet Take 1,000 mg by mouth every 6 (six) hours as needed.  11/04/15   [provider]  albuterol (PROVENTIL HFA) 108 (90 Base) MCG/ACT inhaler 2 puffs every 6 (six) hours as needed.  09/26/15   [provider]  aspirin EC 81 MG tablet Take 81 mg by mouth daily.     [provider]  atorvastatin (LIPITOR) 20 MG tablet Take 20 mg by mouth daily.     [provider]  calcium carbonate (TUMS - DOSED IN MG ELEMENTAL CALCIUM) 500 MG chewable tablet Chew 2 tablets by mouth every morning.     [provider]  cephALEXin (KEFLEX) 500 MG capsule Take 1 capsule  (500 mg total) by mouth 2 (two) times daily. 05/11/18   Orlie Dakin, MD  cephALEXin (KEFLEX) 500 MG capsule Take 1 capsule (500 mg total) by mouth 2 (two) times daily. 05/11/18   Orlie Dakin, MD  chlorhexidine (PERIDEX) 0.12 % solution 15 mLs. 12/02/14   [provider]  Cholecalciferol (VITAMIN D3 SUPER STRENGTH) 2000 units TABS Take 2,000 Units by mouth daily.     [provider]  cyclobenzaprine (FLEXERIL) 10 MG tablet Take 10 mg by mouth daily as needed.  05/23/13   [provider]  escitalopram (LEXAPRO) 10 MG tablet 10 mg daily.  09/09/15   [provider]  fluticasone (FLONASE) 50 MCG/ACT nasal spray Place 2 sprays into both nostrils daily.  08/12/15   [provider]  furosemide (LASIX) 40 MG tablet Take 40 mg by mouth daily.     [provider]  lidocaine (LIDODERM) 5 % Place onto the skin. 09/01/15   [provider]  lisinopril (PRINIVIL,ZESTRIL) 5 MG tablet Take 5 mg by mouth daily.    [provider]  metFORMIN (GLUCOPHAGE-XR) 500 MG 24 hr tablet Take 500 mg by mouth daily before supper.    [provider]  methadone (DOLOPHINE) 10 MG tablet Take 5 mg by mouth at bedtime.  10/04/15   [provider]  mometasone-formoterol (DULERA) 200-5 MCG/ACT AERO Inhale 2 puffs into the lungs 2 (two) times daily as needed.     [provider]  Multiple Vitamin (MULTIVITAMIN) tablet Take by mouth.    [provider]  Omega-3 Fatty Acids (FISH OIL) 1000 MG CAPS Take 1 capsule by mouth daily.    [provider]  ondansetron (ZOFRAN ODT) 4 MG disintegrating tablet Take 1 tablet (4 mg total) by mouth every 8 (eight) hours as needed for nausea or vomiting. 05/21/18   Horton, Barbette Hair, MD  ondansetron (ZOFRAN) 4 MG tablet Take 4 mg by mouth every 6 (six) hours as needed.  12/07/14   [provider]  Oxycodone HCl 20 MG TABS Take 20 mg by mouth every 4 (four) hours as needed.  11/04/15    [provider]  pantoprazole (PROTONIX) 40 MG tablet Take 40 mg by mouth. 12/01/14   [provider]  potassium chloride (K-DUR,KLOR-CON) 10  MEQ tablet 20 mEq 2 (two) times daily.  07/20/15   [provider]  silver sulfADIAZINE (SILVADENE) 1 % cream Apply 1 application topically 2 (two) times daily as needed (rash).     [provider]    Allergies    Gabapentin, Fluoxetine, Naproxen, Reglan [metoclopramide], Tizanidine, and Ivp dye [iodinated diagnostic agents]  Review of Systems   Review of Systems All other systems reviewed and are negative except that which was mentioned in HPI  Physical Exam Updated Vital Signs BP (!) 145/86 (BP Location: Right Arm)   Pulse 73   Temp 98.9 F (37.2 C) (Oral)   Resp 16   SpO2 97%   Physical Exam Vitals and nursing note reviewed.  Constitutional:      General: She is not in acute distress.    Appearance: She is well-developed.  HENT:     Head: Normocephalic and atraumatic.     Nose: Nose normal.  Eyes:     Conjunctiva/sclera: Conjunctivae normal.  Cardiovascular:     Rate and Rhythm: Normal rate and regular rhythm.     Heart sounds: Normal heart sounds. No murmur.  Pulmonary:     Effort: Pulmonary effort is normal.     Breath sounds: Normal breath sounds.  Abdominal:     General: There is no distension.     Palpations: Abdomen is soft.     Tenderness: There is no abdominal tenderness.  Musculoskeletal:     Cervical back: Neck supple.  Skin:    General: Skin is warm and dry.  Neurological:     Mental Status: She is alert and oriented to person, place, and time.     Comments: Fluent speech  Psychiatric:        Judgment: Judgment normal.     ED Results / Procedures / Treatments   Labs (all labs ordered are listed, but only abnormal results are displayed) Labs Reviewed  CBG MONITORING, ED - Abnormal; Notable for the following components:      Result Value   Glucose-Capillary 107 (*)     All other components within normal limits  CBG MONITORING, ED    EKG EKG Interpretation  Date/Time:  Saturday August 29 2019 16:47:46 EST Ventricular Rate:  61 PR Interval:    QRS Duration: 96 QT Interval:  425 QTC Calculation: 429 R Axis:   14 Text Interpretation: Sinus rhythm Prolonged PR interval Abnormal R-wave progression, early transition No significant change since last tracing Confirmed by Theotis Burrow [phone]) on 08/29/2019 5:11:22 PM   Radiology No results found.  Procedures Procedures (including critical care time)  Medications Ordered in ED Medications - No data to display  ED Course  I have reviewed the triage vital signs and the nursing notes.  Pertinent labs that were available during my care of the patient were reviewed by me and considered in my medical decision making (see chart for details).    MDM Rules/Calculators/A&P                      Alert and comfortable on exam, reassuring VS. EKG similar to previous. Initial BG 89. Pt drank fluids, was observed in ED for over an hour, and repeat BG 107.  She has no signs/sx of allergic reaction or other life-threatening process after vaccine. She also denies any symptoms suggestive of acute illness prior to vaccine. I have reviewed return precautions. Final Clinical Impression(s) / ED Diagnoses Final diagnoses:  Fatigue, unspecified type    Rx /  DC Orders ED Discharge Orders    None       Cahterine Heinzel, Wenda Overland, MD 08/29/19 [phone]

## 2019-08-29 NOTE — ED Notes (Signed)
Pt verbalized understanding of d/c instructions, follow up care and signs and symptoms that require return to the ED the patient had no further questions at this time.

## 2019-08-29 NOTE — Progress Notes (Signed)
   Covid-19 Vaccination Clinic  Name:  Cathy Richardson    MRN: [id number] DOB: March 09, 1941  08/29/2019  [name] was observed post Covid-19 immunization for 1hour and 40 minutes after vaccination and was provided with Vaccine Information Sheet and instruction to access the V-Safe system.   .    Immunizations Administered    Name Date Dose VIS Date Route   Pfizer COVID-19 Vaccine 08/29/2019  2:28 PM 0.3 mL 07/10/2019 Intramuscular   Manufacturer: [place]   Lot: [id number]   NDC: [id number] Vaccine administered; pt moved to observation area. She was expected to be a 30 min obs due to history of 2 serious reactions to IV contrast dye 1447 Pt reported feeling hot and sweaty; EMT Heidi at bedside 1501 BP 162/96, P64, RR 14. Pt reports she feels cooler. She also reports took her AM meds.  1502 CBG 110; takes metformin at night; normal CBG for her is [phone] EMT continued assessment 1512 C/O feels "jittery"; hasn't eaten anything but cookies today because she wasn't hungry 1515 Patient and daughter refuse to go to ED; they will go eat and will go directly home. Pt started to look sleepy and complaining of wanting to go to s/leep 1517 BP 158/92; EMT re-took on forearm: 148 palp 1521 AAo x4 but more sleepy 1528 Increasing somnolence, still refusing to go to ED but family now considering transfer to ED 1531 Agreed to eat peppermint; attempted to walk but unable to 1533 Increasing agitation, moving all limbs, turning head back and forth 1538 CBG decreased to 106; EMT confirmed calibration of machine 1541 Pt and family agreed to ED transfer; ambulance called 1547 Increasing weakness, pt struggling to sit up in w/c 1555 EMS arrived

## 2019-09-24 ENCOUNTER — Ambulatory Visit: Payer: Medicare Other | Attending: Internal Medicine

## 2019-09-24 DIAGNOSIS — Z23 Encounter for immunization: Secondary | ICD-10-CM | POA: Insufficient documentation

## 2019-09-24 NOTE — Progress Notes (Signed)
   Covid-19 Vaccination Clinic  Name:  Cathy Richardson    MRN: [id number] DOB: May 08, 1941  09/24/2019  [name] was observed post Covid-19 immunization for 30 minutes based on pre-vaccination screening without incidence. She was provided with Vaccine Information Sheet and instruction to access the V-Safe system.   [name] was instructed to call 911 with any severe reactions post vaccine: Marland Kitchen Difficulty breathing  . Swelling of your face and throat  . A fast heartbeat  . A bad rash all over your body  . Dizziness and weakness    Immunizations Administered    Name Date Dose VIS Date Route   Pfizer COVID-19 Vaccine 09/24/2019  8:32 AM 0.3 mL 07/10/2019 Intramuscular   Manufacturer: [place]   Lot: [id number]   [place]: [id number]

## 2019-11-26 DIAGNOSIS — R5382 Chronic fatigue, unspecified: Secondary | ICD-10-CM | POA: Insufficient documentation

## 2020-03-03 ENCOUNTER — Encounter (HOSPITAL_BASED_OUTPATIENT_CLINIC_OR_DEPARTMENT_OTHER): Payer: Self-pay | Admitting: *Deleted

## 2020-03-03 ENCOUNTER — Emergency Department (HOSPITAL_BASED_OUTPATIENT_CLINIC_OR_DEPARTMENT_OTHER)
Admission: EM | Admit: 2020-03-03 | Discharge: 2020-03-03 | Disposition: A | Discharge status: Home / Self Care | Payer: Medicare Other | Attending: Emergency Medicine | Admitting: Emergency Medicine

## 2020-03-03 ENCOUNTER — Other Ambulatory Visit: Payer: Self-pay

## 2020-03-03 ENCOUNTER — Emergency Department (HOSPITAL_BASED_OUTPATIENT_CLINIC_OR_DEPARTMENT_OTHER): Payer: Medicare Other

## 2020-03-03 DIAGNOSIS — Z20822 Contact with and (suspected) exposure to covid-19: Secondary | ICD-10-CM | POA: Diagnosis not present

## 2020-03-03 DIAGNOSIS — J4521 Mild intermittent asthma with (acute) exacerbation: Secondary | ICD-10-CM | POA: Insufficient documentation

## 2020-03-03 DIAGNOSIS — R0789 Other chest pain: Secondary | ICD-10-CM | POA: Diagnosis present

## 2020-03-03 DIAGNOSIS — Z7951 Long term (current) use of inhaled steroids: Secondary | ICD-10-CM | POA: Diagnosis not present

## 2020-03-03 DIAGNOSIS — Z96652 Presence of left artificial knee joint: Secondary | ICD-10-CM | POA: Insufficient documentation

## 2020-03-03 DIAGNOSIS — I10 Essential (primary) hypertension: Secondary | ICD-10-CM | POA: Insufficient documentation

## 2020-03-03 DIAGNOSIS — Z79899 Other long term (current) drug therapy: Secondary | ICD-10-CM | POA: Diagnosis not present

## 2020-03-03 DIAGNOSIS — Z45010 Encounter for checking and testing of cardiac pacemaker pulse generator [battery]: Secondary | ICD-10-CM

## 2020-03-03 DIAGNOSIS — E119 Type 2 diabetes mellitus without complications: Secondary | ICD-10-CM | POA: Diagnosis not present

## 2020-03-03 DIAGNOSIS — T82111A Breakdown (mechanical) of cardiac pulse generator (battery), initial encounter: Secondary | ICD-10-CM | POA: Insufficient documentation

## 2020-03-03 DIAGNOSIS — C649 Malignant neoplasm of unspecified kidney, except renal pelvis: Secondary | ICD-10-CM | POA: Insufficient documentation

## 2020-03-03 DIAGNOSIS — J069 Acute upper respiratory infection, unspecified: Secondary | ICD-10-CM | POA: Insufficient documentation

## 2020-03-03 DIAGNOSIS — R0602 Shortness of breath: Secondary | ICD-10-CM

## 2020-03-03 LAB — BASIC METABOLIC PANEL
Anion gap: 12 (ref 5–15)
BUN: 11 mg/dL (ref 8–23)
CO2: 24 mmol/L (ref 22–32)
Calcium: 9.4 mg/dL (ref 8.9–10.3)
Chloride: 106 mmol/L (ref 98–111)
Creatinine, Ser: 1.41 mg/dL — ABNORMAL HIGH (ref 0.44–1.00)
GFR calc Af Amer: 41 mL/min — ABNORMAL LOW (ref >60)
GFR calc non Af Amer: 36 mL/min — ABNORMAL LOW (ref >60)
Glucose, Bld: 111 mg/dL — ABNORMAL HIGH (ref 70–99)
Potassium: 4.6 mmol/L (ref 3.5–5.1)
Sodium: 142 mmol/L (ref 135–145)

## 2020-03-03 LAB — CBC
HCT: 36.4 % (ref 36.0–46.0)
Hemoglobin: 11.9 g/dL — ABNORMAL LOW (ref 12.0–15.0)
MCH: 31.4 pg (ref 26.0–34.0)
MCHC: 32.7 g/dL (ref 30.0–36.0)
MCV: 96 fL (ref 80.0–100.0)
Platelets: 224 10*3/uL (ref 150–400)
RBC: 3.79 MIL/uL — ABNORMAL LOW (ref 3.87–5.11)
RDW: 13.2 % (ref 11.5–15.5)
WBC: 4.2 10*3/uL (ref 4.0–10.5)
nRBC: 0 % (ref 0.0–0.2)

## 2020-03-03 LAB — SARS CORONAVIRUS 2 BY RT PCR (HOSPITAL ORDER, PERFORMED IN ~~LOC~~ HOSPITAL LAB): SARS Coronavirus 2: NEGATIVE

## 2020-03-03 LAB — TROPONIN I (HIGH SENSITIVITY)
Troponin I (High Sensitivity): 7 ng/L (ref <18)
Troponin I (High Sensitivity): 7 ng/L (ref <18)

## 2020-03-03 MED ORDER — ALBUTEROL SULFATE (2.5 MG/3ML) 0.083% IN NEBU
2.5000 mg | INHALATION_SOLUTION | Freq: Once | RESPIRATORY_TRACT | Status: AC
  Administered 2020-03-03: 2.5 mg via RESPIRATORY_TRACT
  Filled 2020-03-03: qty 3

## 2020-03-03 MED ORDER — SODIUM CHLORIDE 0.9% FLUSH
3.0000 mL | Freq: Once | INTRAVENOUS | Status: DC
  Filled 2020-03-03: qty 3

## 2020-03-03 NOTE — ED Triage Notes (Signed)
Cough. Hx of asthma. She took Prednisone and an antibiotic last week with some relief. Unable to get a deep breath per pt.

## 2020-03-03 NOTE — ED Notes (Signed)
Pt's pacemaker has been interrogated and sent to [employer]. Awaiting report.

## 2020-03-03 NOTE — ED Notes (Signed)
Pt given strict return precautions and pt understands the need to be seen by her Cardiologist first thing in the AM.

## 2020-03-03 NOTE — Discharge Instructions (Signed)
You appear to have an upper respiratory infection (URI). An upper respiratory tract infection, or cold, is a viral infection of the air passages leading to the lungs. It is contagious and can be spread to others, especially during the first 3 or 4 days. It cannot be cured by antibiotics or other medicines. RETURN IMMEDIATELY IF you develop shortness of breath, confusion or altered mental status, a new rash, become dizzy, faint, or poorly responsive, or are unable to be cared for at home. Get help right away if: Your peak flow reading is less than 50% of your personal best. This is in the red zone, which means "danger." You have severe trouble breathing. You develop chest pain or discomfort. Your medicines no longer seem to be helping. You vomit. You cannot eat or drink without vomiting. You are coughing up yellow, green, brown, or bloody mucus. You have a fever and your symptoms suddenly get worse. You have trouble swallowing. You feel very tired, and breathing becomes tiring.

## 2020-03-03 NOTE — ED Notes (Addendum)
Report received from [employer] stating the Pacemaker underwent a reset just after 1500 today. All saved data was lost and settings were reset to factory default. Awaiting contact from [employer] local representative. EDP notified.

## 2020-03-03 NOTE — ED Provider Notes (Signed)
[facility] EMERGENCY DEPARTMENT Provider Note   CSN: [id number] Arrival date & time: 03/03/20  1345     History Chief Complaint  Patient presents with  . Cough    Cathy Richardson is a 79 y.o. female.  Who presents the emergency department chief complaint of chest tightness.  Patient states that she has had 2 weeks of cough, chest tightness.  She feels like this is similar to previous episodes of asthma.  The patient was seen on 02/22/2020 diagnosed with laryngitis and mild intermittent asthma.  She was started on prednisone, albuterol and azithromycin.  Patient states that she felt significantly improved however over the past several days symptoms have returned.  She states that her chest "just feels really tight, like I cannot get a full breath.  She denies any exertional component.  She denies chest pain.  Patient has a past medical history of diabetes, hypertension, hyperlipidemia, sleep apnea, reflux and she is status post pacemaker placement for sick sinus syndrome.  She follows with [facility] cardiology [facility] cardiology group at [place]).  HPI     Past Medical History:  Diagnosis Date  . Anemia   . Arthritis   . Asthma   . Cancer [facility])    History of Renal Cancer  . Chronic back pain   . Depression   . Diabetes mellitus without complication ([place])   . Excessive somnolence disorder   . Family history of adverse reaction to anesthesia    son has problems with nausea   . GERD (gastroesophageal reflux disease)   . Glaucoma    pt denies on preop visit of 01/03/2015   . Headache   . Heart murmur   . High cholesterol   . History of hiatal hernia   . Hypertension   . Osteoporosis   . Peptic ulcer   . Renal disorder    only one kidney   . Sleep apnea    cpap- does not know settings   . Urinary tract infection    hx     Patient Active Problem List   Diagnosis Date Noted  . 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 Hypertension  11/12/2015  . GERD (gastroesophageal reflux disease) 11/12/2015  . Diabetes mellitus without complication ([place]) 43/32/9518  . Vitamin D deficiency 11/12/2015  . High cholesterol 11/12/2015  . Depression 11/12/2015  . Chronic pain syndrome 11/12/2015  . Asthma 11/12/2015  . Sublingual gland swelling 01/05/2015  . Prolapsed sublingual gland 12/30/2014    Past Surgical History:  Procedure Laterality Date  . bowel obstruction surgery      x 2   . Carpule Tunnel Release Bilateral   . CHOLECYSTECTOMY    . JOINT REPLACEMENT     left knee replacement   . KNEE ARTHROCENTESIS     left knee  . NEPHRECTOMY    . PACEMAKER IMPLANT    . Removal of Skin Cancer  N/A    Forehead  . SKIN SPLIT GRAFT N/A 01/05/2015   Procedure: MANDIBULAR LINGUAL SPLIT THICKNESS SKIN GRAFT VESTIVULOPLASTY;  Surgeon: Michaela Corner, DDS;  Location: [facility] ORS;  Service: Oral Surgery;  Laterality: N/A;     OB History    Gravida  7   Para  7   Term  6   Preterm  1   AB      Living  5     SAB      TAB      Ectopic  Multiple      Live Births              No family history on file.  Social History   Tobacco Use  . Smoking status: Never Smoker  . Smokeless tobacco: Never Used  Vaping Use  . Vaping Use: Never used  Substance Use Topics  . Alcohol use: No  . Drug use: No    Home Medications Prior to Admission medications   Medication Sig Start Date End Date Taking? Authorizing Provider  albuterol (VENTOLIN HFA) 108 (90 Base) MCG/ACT inhaler Inhale into the lungs. 02/22/20  Yes [provider]  aspirin EC 81 MG tablet Take 81 mg by mouth daily.    Yes [provider]  atorvastatin (LIPITOR) 20 MG tablet Take 1 tablet by mouth at bedtime. 12/21/19  Yes [provider]  escitalopram (LEXAPRO) 20 MG tablet Take 1 tablet by mouth daily. 12/22/19  Yes [provider]  lidocaine (LIDODERM) 5 % See admin instructions. 04/18/17  Yes [provider]    lisinopril (ZESTRIL) 5 MG tablet Take 1 tablet by mouth daily. 08/25/19  Yes [provider]  metoprolol tartrate (LOPRESSOR) 50 MG tablet Take 1 tablet by mouth 3 (three) times daily. 11/23/19  Yes [provider]  Multiple Vitamin (MULTIVITAMIN) tablet Take by mouth.   Yes [provider]  Omega-3 Fatty Acids (FISH OIL) 1000 MG CAPS Take 1 capsule by mouth daily.   Yes [provider]  Omega-3 Fatty Acids (OMEGA-3 FISH OIL) 300 MG CAPS Take 1 tablet by mouth at bedtime.   Yes [provider]  pantoprazole (PROTONIX) 40 MG tablet Take 40 mg by mouth. 12/01/14  Yes [provider]  pantoprazole (PROTONIX) 40 MG tablet Take by mouth. 12/30/19  Yes [provider]  phenylephrine (SUDAFED PE) 10 MG TABS tablet Take by mouth.   Yes [provider]  pramipexole (MIRAPEX) 0.125 MG tablet TAKE 1 TO 2 TABLETS BY MOUTH NIGHTLY 01/26/20  Yes [provider]  traMADol (ULTRAM) 50 MG tablet Take 1 tablet by mouth every 8 (eight) hours as needed. 02/22/20  Yes [provider]  acetaminophen (TYLENOL) 500 MG tablet Take 1,000 mg by mouth every 6 (six) hours as needed.  11/04/15   [provider]  albuterol (PROVENTIL HFA) 108 (90 Base) MCG/ACT inhaler 2 puffs every 6 (six) hours as needed.  09/26/15   [provider]  atorvastatin (LIPITOR) 20 MG tablet Take 20 mg by mouth daily.     [provider]  calcium carbonate (OS-CAL) 1250 (500 Ca) MG chewable tablet Chew 2 tablets by mouth daily.    [provider]  calcium carbonate (TUMS - DOSED IN MG ELEMENTAL CALCIUM) 500 MG chewable tablet Chew 2 tablets by mouth every morning.     [provider]  cephALEXin (KEFLEX) 500 MG capsule Take 1 capsule (500 mg total) by mouth 2 (two) times daily. 05/11/18   Orlie Dakin, MD  cephALEXin (KEFLEX) 500 MG capsule Take 1 capsule (500 mg total) by mouth 2 (two) times daily. 05/11/18   Orlie Dakin, MD  chlorhexidine (PERIDEX) 0.12 % solution 15 mLs. 12/02/14   [provider]  Cholecalciferol (VITAMIN D3 SUPER STRENGTH) 2000 units TABS Take 2,000 Units by mouth daily.     [provider]  Cholecalciferol 50 MCG (2000 UT) TABS Take 1 tablet by mouth daily.    [provider]  cyclobenzaprine (FLEXERIL) 10 MG tablet Take 10 mg by  mouth daily as needed.  05/23/13   [provider]  diphenhydrAMINE (SOMINEX) 25 MG tablet Take by mouth.    [provider]  escitalopram (LEXAPRO) 10 MG tablet 10 mg daily.  09/09/15   [provider]  fluticasone (FLONASE) 50 MCG/ACT nasal spray Place 2 sprays into both nostrils daily.  08/12/15   [provider]  furosemide (LASIX) 40 MG tablet Take 40 mg by mouth daily.     [provider]  lidocaine (LIDODERM) 5 % Place onto the skin. 09/01/15   [provider]  lisinopril (PRINIVIL,ZESTRIL) 5 MG tablet Take 5 mg by mouth daily.    [provider]  metFORMIN (GLUCOPHAGE-XR) 500 MG 24 hr tablet Take 500 mg by mouth daily before supper.    [provider]  methadone (DOLOPHINE) 10 MG tablet Take 5 mg by mouth at bedtime.  10/04/15   [provider]  mometasone-formoterol (DULERA) 200-5 MCG/ACT AERO Inhale 2 puffs into the lungs 2 (two) times daily as needed.     [provider]  ondansetron (ZOFRAN ODT) 4 MG disintegrating tablet Take 1 tablet (4 mg total) by mouth every 8 (eight) hours as needed for nausea or vomiting. 05/21/18   Horton, Barbette Hair, MD  ondansetron (ZOFRAN) 4 MG tablet Take 4 mg by mouth every 6 (six) hours as needed.  12/07/14   [provider]  Oxycodone HCl 20 MG TABS Take 20 mg by mouth every 4 (four) hours as needed.  11/04/15   [provider]  potassium chloride (K-DUR,KLOR-CON) 10 MEQ tablet 20 mEq 2 (two) times daily.  07/20/15   [provider]  silver sulfADIAZINE (SILVADENE) 1 % cream Apply 1  application topically 2 (two) times daily as needed (rash).     [provider]    Allergies    Gabapentin, Fluoxetine, Naproxen, Reglan [metoclopramide], Tizanidine, and Ivp dye [iodinated diagnostic agents]  Review of Systems   Review of Systems Ten systems reviewed and are negative for acute change, except as noted in the HPI.   Physical Exam Updated Vital Signs BP (!) 161/138   Pulse (!) 59   Temp 98.6 F (37 C) (Oral)   Resp 14   Ht 5\' 3"  (1.6 m)   Wt 99.3 kg   SpO2 100%   BMI 38.78 kg/m   Physical Exam Vitals and nursing note reviewed.  Constitutional:      General: She is not in acute distress.    Appearance: She is well-developed. She is not diaphoretic.  HENT:     Head: Normocephalic and atraumatic.  Eyes:     General: No scleral icterus.    Conjunctiva/sclera: Conjunctivae normal.  Cardiovascular:     Rate and Rhythm: Normal rate and regular rhythm.     Heart sounds: Normal heart sounds. No murmur heard.  No friction rub. No gallop.   Pulmonary:     Effort: Pulmonary effort is normal. No respiratory distress.     Comments: Breath sounds are distant but normal in all lung fields, no wheezing, no rhonchi Abdominal:     General: Bowel sounds are normal. There is no distension.     Palpations: Abdomen is soft. There is no mass.     Tenderness: There is no abdominal tenderness. There is no guarding.  Musculoskeletal:     Cervical back: Normal range of motion.  Skin:    General: Skin is warm and dry.  Neurological:     Mental Status: She is alert  and oriented to person, place, and time.  Psychiatric:        Behavior: Behavior normal.     ED Results / Procedures / Treatments   Labs (all labs ordered are listed, but only abnormal results are displayed) Labs Reviewed  BASIC METABOLIC PANEL - Abnormal; Notable for the following components:      Result Value   Glucose, Bld 111 (*)    Creatinine, Ser 1.41 (*)    GFR calc non Af Amer 36 (*)    GFR  calc Af Amer 41 (*)    All other components within normal limits  CBC - Abnormal; Notable for the following components:   RBC 3.79 (*)    Hemoglobin 11.9 (*)    All other components within normal limits  SARS CORONAVIRUS 2 BY RT PCR (HOSPITAL ORDER, [facility])  TROPONIN I (HIGH SENSITIVITY)  TROPONIN I (HIGH SENSITIVITY)    EKG EKG Interpretation  Date/Time:  Thursday March 03 2020 14:14:07 EDT Ventricular Rate:  59 PR Interval:    QRS Duration: 181 QT Interval:  527 QTC Calculation: 523 R Axis:   -67 Text Interpretation: ventricular-paced complexes Reconfirmed by Fredia Sorrow [phone]) on 03/03/2020 3:51:54 PM   Radiology DG Chest Port 1 View  Result Date: 03/03/2020 CLINICAL DATA:  Cough EXAM: PORTABLE CHEST 1 VIEW COMPARISON:  08/28/2018 FINDINGS: Cardiac shadow is stable. Pacing device is again seen. Scoliosis is again noted concave to the left. The lungs are clear. IMPRESSION: No active disease. Electronically Signed   By: Inez Catalina M.D.   On: 03/03/2020 14:29    Procedures Procedures (including critical care time)  Medications Ordered in ED Medications  albuterol (PROVENTIL) (2.5 MG/3ML) 0.083% nebulizer solution 2.5 mg (2.5 mg Nebulization Given 03/03/20 1824)    ED Course  I have reviewed the triage vital signs and the nursing notes.  Pertinent labs & imaging results that were available during my care of the patient were reviewed by me and considered in my medical decision making (see chart for details).    MDM Rules/Calculators/A&P                          1:41 PM BP (!) 161/138   Pulse (!) 59   Temp 98.6 F (37 C) (Oral)   Resp 14   Ht 5\' 3"  (1.6 m)   Wt 99.3 kg   SpO2 100%   BMI 38.78 kg/m  Patient here with complaint of shortness of breath.The emergent differential diagnosis for shortness of breath includes, but is not limited to, Pulmonary edema, bronchoconstriction, Pneumonia, Pulmonary embolism, Pneumotherax/  Hemothorax, Dysrythmia, ACS.  I ordered interpreted and reviewed the patient's labs which include a BMP which shows mild chronic renal insufficiency, CBC which shows normocytic anemia.  Troponin within normal limits.  I personally reviewed the 1 view chest x-ray images which show no acute abnormalities. EKG shows a ventricular paced rhythm at a rate of 59. The patient's pacemaker was interrogated here by the patient's nurse at home.  Of the pacemaker company called back to state that the patient's pacemaker has undergone a complete reset and is currently pacing at a default setting.  During the visit the patient did have a run of tachycardia to 156. I have a call out to the patient's cardiology PA Adella Hare and am awaiting a callback to discuss the case.  Unfortunately I did not get a return call from the patient's cardiology  group.  I did get a call back from the [employer] representative who states that the patient's pacemaker reset appears to be secondary to very low battery.  The representative feels that the patient should be admitted and have it changed. I placed a call to cardiology through the pal line.   I spoke with the cardiologist on call with [facility]. There are no available beds and they cannot accept the patient at this time. I will discuss with [facility] cardiology.   I discussed the case with Dr. Marlou Porch, who states that he doesn't feel the patient needs admission for emergency battery change but does feel that she needs to see her cardiologist  Tomorrow in clinic. Currently the patient's pace maker is reset to her normal parameters and she is HDS. Patient will be discharged. I had a long discussion with the patient about her need to go to the cardiology office in the morning. I have written a letter for the patient to take to the cardiology office discussing the findings. Patient understands and return precautions discussed.  Final Clinical Impression(s) / ED Diagnoses Final diagnoses:   Upper respiratory tract infection, unspecified type  Mild intermittent reactive airway disease with acute exacerbation  Pacemaker at end of battery life    Rx / DC Orders ED Discharge Orders    None       Margarita Mail, PA-C 03/04/20 1341    Fredia Sorrow, MD 03/08/20 [phone]

## 2020-03-03 NOTE — ED Notes (Signed)
Assisted to bedside commode

## 2020-06-20 ENCOUNTER — Emergency Department (HOSPITAL_BASED_OUTPATIENT_CLINIC_OR_DEPARTMENT_OTHER): Payer: Medicare Other

## 2020-06-20 ENCOUNTER — Other Ambulatory Visit: Payer: Self-pay

## 2020-06-20 ENCOUNTER — Encounter (HOSPITAL_BASED_OUTPATIENT_CLINIC_OR_DEPARTMENT_OTHER): Payer: Self-pay | Admitting: Emergency Medicine

## 2020-06-20 ENCOUNTER — Emergency Department (HOSPITAL_BASED_OUTPATIENT_CLINIC_OR_DEPARTMENT_OTHER)
Admission: EM | Admit: 2020-06-20 | Discharge: 2020-06-20 | Disposition: A | Discharge status: Home / Self Care | Payer: Medicare Other | Attending: Emergency Medicine | Admitting: Emergency Medicine

## 2020-06-20 DIAGNOSIS — I10 Essential (primary) hypertension: Secondary | ICD-10-CM | POA: Diagnosis not present

## 2020-06-20 DIAGNOSIS — Z79899 Other long term (current) drug therapy: Secondary | ICD-10-CM | POA: Diagnosis not present

## 2020-06-20 DIAGNOSIS — Z96652 Presence of left artificial knee joint: Secondary | ICD-10-CM | POA: Insufficient documentation

## 2020-06-20 DIAGNOSIS — Z7984 Long term (current) use of oral hypoglycemic drugs: Secondary | ICD-10-CM | POA: Insufficient documentation

## 2020-06-20 DIAGNOSIS — M25562 Pain in left knee: Secondary | ICD-10-CM | POA: Diagnosis not present

## 2020-06-20 DIAGNOSIS — Z7982 Long term (current) use of aspirin: Secondary | ICD-10-CM | POA: Insufficient documentation

## 2020-06-20 DIAGNOSIS — J45909 Unspecified asthma, uncomplicated: Secondary | ICD-10-CM | POA: Diagnosis not present

## 2020-06-20 DIAGNOSIS — W010XXA Fall on same level from slipping, tripping and stumbling without subsequent striking against object, initial encounter: Secondary | ICD-10-CM | POA: Diagnosis not present

## 2020-06-20 DIAGNOSIS — M25569 Pain in unspecified knee: Secondary | ICD-10-CM | POA: Diagnosis present

## 2020-06-20 DIAGNOSIS — E119 Type 2 diabetes mellitus without complications: Secondary | ICD-10-CM | POA: Insufficient documentation

## 2020-06-20 DIAGNOSIS — W19XXXA Unspecified fall, initial encounter: Secondary | ICD-10-CM

## 2020-06-20 MED ORDER — OXYCODONE-ACETAMINOPHEN 5-325 MG PO TABS
1.0000 | ORAL_TABLET | Freq: Once | ORAL | Status: AC
  Administered 2020-06-20: 1 via ORAL
  Filled 2020-06-20: qty 1

## 2020-06-20 NOTE — Discharge Instructions (Addendum)
You were seen today for knee pain after a fall.  Keep iced and elevated.  Your x-rays do not show that anything is broken.  If you continue to have pain, follow-up with your orthopedist.  Take your tramadol and muscle relaxer at home as needed.

## 2020-06-20 NOTE — ED Triage Notes (Signed)
Reports she fell in the kitchen when something got hooked on her foot.  C/O left knee and hip pain.  Reports the knee is worse than the hip.

## 2020-06-20 NOTE — ED Provider Notes (Signed)
[facility] EMERGENCY DEPARTMENT Provider Note   CSN: [id number] Arrival date & time: 06/20/20  0201     History Chief Complaint  Patient presents with  . Fall    Cathy Richardson is a 79 y.o. female.  HPI     This is a 79 year old female with a history of diabetes, hypertension who presents with left knee and hip pain.  Patient reports that at 5 PM yesterday she tripped and fell in her kitchen.  She states that she feels like she got her foot caught on a towel that was on the floor.  She denies hitting her head or loss of consciousness.  She states that she has had ongoing left hip and left knee pain.  She denies directly hitting her knee on the floor.  She has been ambulatory.  She took a tramadol at home with minimal relief.  She rates her pain at 10 out of 10.  Denies numbness or tingling in the extremity.  Past Medical History:  Diagnosis Date  . Anemia   . Arthritis   . Asthma   . Cancer [facility])    History of Renal Cancer  . Chronic back pain   . Depression   . Diabetes mellitus without complication ([place])   . Excessive somnolence disorder   . Family history of adverse reaction to anesthesia    son has problems with nausea   . GERD (gastroesophageal reflux disease)   . Glaucoma    pt denies on preop visit of 01/03/2015   . Headache   . Heart murmur   . High cholesterol   . History of hiatal hernia   . Hypertension   . Osteoporosis   . Peptic ulcer   . Renal disorder    only one kidney   . Sleep apnea    cpap- does not know settings   . Urinary tract infection    hx     Patient Active Problem List   Diagnosis Date Noted  . 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 Hypertension 11/12/2015  . GERD (gastroesophageal reflux disease) 11/12/2015  . Diabetes mellitus without complication ([place]) 76/28/3151  . Vitamin D deficiency 11/12/2015  . High cholesterol 11/12/2015  . Depression 11/12/2015  . Chronic pain syndrome 11/12/2015  . Asthma  11/12/2015  . Sublingual gland swelling 01/05/2015  . Prolapsed sublingual gland 12/30/2014    Past Surgical History:  Procedure Laterality Date  . bowel obstruction surgery      x 2   . Carpule Tunnel Release Bilateral   . CHOLECYSTECTOMY    . JOINT REPLACEMENT     left knee replacement   . KNEE ARTHROCENTESIS     left knee  . NEPHRECTOMY    . PACEMAKER IMPLANT    . Removal of Skin Cancer  N/A    Forehead  . SKIN SPLIT GRAFT N/A 01/05/2015   Procedure: MANDIBULAR LINGUAL SPLIT THICKNESS SKIN GRAFT VESTIVULOPLASTY;  Surgeon: Michaela Corner, DDS;  Location: [facility] ORS;  Service: Oral Surgery;  Laterality: N/A;     OB History    Gravida  7   Para  7   Term  6   Preterm  1   AB      Living  5     SAB      TAB      Ectopic      Multiple      Live Births  No family history on file.  Social History   Tobacco Use  . Smoking status: Never Smoker  . Smokeless tobacco: Never Used  Vaping Use  . Vaping Use: Never used  Substance Use Topics  . Alcohol use: No  . Drug use: No    Home Medications Prior to Admission medications   Medication Sig Start Date End Date Taking? Authorizing Provider  acetaminophen (TYLENOL) 500 MG tablet Take 1,000 mg by mouth every 6 (six) hours as needed.  11/04/15   [provider]  albuterol (PROVENTIL HFA) 108 (90 Base) MCG/ACT inhaler 2 puffs every 6 (six) hours as needed.  09/26/15   [provider]  albuterol (VENTOLIN HFA) 108 (90 Base) MCG/ACT inhaler Inhale into the lungs. 02/22/20   [provider]  aspirin EC 81 MG tablet Take 81 mg by mouth daily.     [provider]  atorvastatin (LIPITOR) 20 MG tablet Take 20 mg by mouth daily.     [provider]  atorvastatin (LIPITOR) 20 MG tablet Take 1 tablet by mouth at bedtime. 12/21/19   [provider]  calcium carbonate (OS-CAL) 1250 (500 Ca) MG chewable tablet Chew 2 tablets by mouth daily.    [provider]  calcium carbonate (TUMS - DOSED IN MG ELEMENTAL CALCIUM) 500 MG chewable tablet Chew 2 tablets by mouth every morning.     [provider]  cephALEXin (KEFLEX) 500 MG capsule Take 1 capsule (500 mg total) by mouth 2 (two) times daily. 05/11/18   Orlie Dakin, MD  cephALEXin (KEFLEX) 500 MG capsule Take 1 capsule (500 mg total) by mouth 2 (two) times daily. 05/11/18   Orlie Dakin, MD  chlorhexidine (PERIDEX) 0.12 % solution 15 mLs. 12/02/14   [provider]  Cholecalciferol (VITAMIN D3 SUPER STRENGTH) 2000 units TABS Take 2,000 Units by mouth daily.     [provider]  Cholecalciferol 50 MCG (2000 UT) TABS Take 1 tablet by mouth daily.    [provider]  cyclobenzaprine (FLEXERIL) 10 MG tablet Take 10 mg by mouth daily as needed.  05/23/13   [provider]  diphenhydrAMINE (SOMINEX) 25 MG tablet Take by mouth.    [provider]  escitalopram (LEXAPRO) 10 MG tablet 10 mg daily.  09/09/15   [provider]  escitalopram (LEXAPRO) 20 MG tablet Take 1 tablet by mouth daily. 12/22/19   [provider]  fluticasone (FLONASE) 50 MCG/ACT nasal spray Place 2 sprays into both nostrils daily.  08/12/15   [provider]  furosemide (LASIX) 40 MG tablet Take 40 mg by mouth daily.     [provider]  lidocaine (LIDODERM) 5 % Place onto the skin. 09/01/15   [provider]  lidocaine (LIDODERM) 5 % See admin instructions. 04/18/17   [provider]  lisinopril (PRINIVIL,ZESTRIL) 5 MG tablet Take 5 mg by mouth daily.    [provider]  lisinopril (ZESTRIL) 5 MG tablet Take 1 tablet by mouth daily. 08/25/19   [provider]  metFORMIN (GLUCOPHAGE-XR) 500 MG 24 hr tablet Take 500 mg by mouth daily before supper.    [provider]  methadone (DOLOPHINE) 10 MG tablet Take 5 mg by mouth at bedtime.  10/04/15   [provider]  metoprolol tartrate (LOPRESSOR) 50 MG  tablet Take 1 tablet by mouth 3 (three) times daily. 11/23/19   [provider]  mometasone-formoterol (DULERA) 200-5 MCG/ACT AERO Inhale 2 puffs into the lungs 2 (two) times  daily as needed.     [provider]  Multiple Vitamin (MULTIVITAMIN) tablet Take by mouth.    [provider]  Omega-3 Fatty Acids (FISH OIL) 1000 MG CAPS Take 1 capsule by mouth daily.    [provider]  Omega-3 Fatty Acids (OMEGA-3 FISH OIL) 300 MG CAPS Take 1 tablet by mouth at bedtime.    [provider]  ondansetron (ZOFRAN ODT) 4 MG disintegrating tablet Take 1 tablet (4 mg total) by mouth every 8 (eight) hours as needed for nausea or vomiting. 05/21/18   Kalisi Bevill, Barbette Hair, MD  ondansetron (ZOFRAN) 4 MG tablet Take 4 mg by mouth every 6 (six) hours as needed.  12/07/14   [provider]  Oxycodone HCl 20 MG TABS Take 20 mg by mouth every 4 (four) hours as needed.  11/04/15   [provider]  pantoprazole (PROTONIX) 40 MG tablet Take 40 mg by mouth. 12/01/14   [provider]  pantoprazole (PROTONIX) 40 MG tablet Take by mouth. 12/30/19   [provider]  phenylephrine (SUDAFED PE) 10 MG TABS tablet Take by mouth.    [provider]  potassium chloride (K-DUR,KLOR-CON) 10 MEQ tablet 20 mEq 2 (two) times daily.  07/20/15   [provider]  pramipexole (MIRAPEX) 0.125 MG tablet TAKE 1 TO 2 TABLETS BY MOUTH NIGHTLY 01/26/20   [provider]  silver sulfADIAZINE (SILVADENE) 1 % cream Apply 1 application topically 2 (two) times daily as needed (rash).     [provider]  traMADol (ULTRAM) 50 MG tablet Take 1 tablet by mouth every 8 (eight) hours as needed. 02/22/20   [provider]    Allergies    Gabapentin, Fluoxetine, Naproxen, Reglan [metoclopramide], Tizanidine, and Ivp dye [iodinated diagnostic agents]  Review of Systems   Review of Systems  Constitutional: Negative for fever.  Respiratory:  Negative for shortness of breath.   Cardiovascular: Negative for chest pain.  Musculoskeletal:       Left knee and hip pain  Neurological: Negative for syncope, weakness and numbness.  All other systems reviewed and are negative.   Physical Exam Updated Vital Signs BP 128/76   Pulse 72   Temp 98 F (36.7 C) (Oral)   Resp 18   Ht 1.626 m (5\' 4" )   Wt 98.3 kg   SpO2 94%   BMI 37.20 kg/m   Physical Exam Vitals and nursing note reviewed.  Constitutional:      Appearance: She is well-developed. She is obese. She is not ill-appearing.  HENT:     Head: Normocephalic and atraumatic.     Nose: Nose normal.     Mouth/Throat:     Mouth: Mucous membranes are moist.  Eyes:     Pupils: Pupils are equal, round, and reactive to light.  Cardiovascular:     Rate and Rhythm: Normal rate and regular rhythm.     Heart sounds: Normal heart sounds.  Pulmonary:     Effort: Pulmonary effort is normal. No respiratory distress.     Breath sounds: No wheezing.  Abdominal:     Palpations: Abdomen is soft.     Tenderness: There is no abdominal tenderness.  Musculoskeletal:     Cervical back: Neck supple.     Comments: Focused examination of the left lower extremity with normal range of motion of the hip and knee, slight effusion noted of the left knee, no overlying skin changes, well-healing anterior scar, no joint line tenderness, no joint laxity  noted Normal range of motion of the hip, no foreshortening  Skin:    General: Skin is warm and dry.  Neurological:     Mental Status: She is alert and oriented to person, place, and time.  Psychiatric:        Mood and Affect: Mood normal.     ED Results / Procedures / Treatments   Labs (all labs ordered are listed, but only abnormal results are displayed) Labs Reviewed - No data to display  EKG None  Radiology DG Knee Complete 4 Views Left  Result Date: 06/20/2020 CLINICAL DATA:  Pain EXAM: LEFT KNEE - COMPLETE 4+ VIEW COMPARISON:  None.  FINDINGS: The patient is status post total knee arthroplasty. The hardware appears grossly intact. There is no acute displaced fracture. No dislocation. There is a trace suprapatellar joint effusion. IMPRESSION: No acute osseous abnormality. Electronically Signed   By: Constance Holster M.D.   On: 06/20/2020 03:23   DG Hip Unilat W or Wo Pelvis 2-3 Views Left  Result Date: 06/20/2020 CLINICAL DATA:  Pain EXAM: DG HIP (WITH OR WITHOUT PELVIS) 2-3V LEFT COMPARISON:  None. FINDINGS: There is moderate bilateral hip osteoarthritis. There is no acute displaced fracture or dislocation. There are degenerative changes of both sacroiliac joints. IMPRESSION: Moderate bilateral hip osteoarthritis. Electronically Signed   By: Constance Holster M.D.   On: 06/20/2020 03:20    Procedures Procedures (including critical care time)  Medications Ordered in ED Medications  oxyCODONE-acetaminophen (PERCOCET/ROXICET) 5-325 MG per tablet 1 tablet (1 tablet Oral Given 06/20/20 0238)  oxyCODONE-acetaminophen (PERCOCET/ROXICET) 5-325 MG per tablet 1 tablet (1 tablet Oral Given 06/20/20 1093)    ED Course  I have reviewed the triage vital signs and the nursing notes.  Pertinent labs & imaging results that were available during my care of the patient were reviewed by me and considered in my medical decision making (see chart for details).    MDM Rules/Calculators/A&P                          Patient presents with left hip and knee pain after fall approximately 10 hours prior to arrival.  She has been ambulatory.  Pain refractory to tramadol at home.  She is overall nontoxic vital signs are reassuring.  She denies hitting her head or loss of consciousness.  X-rays obtained to rule out fracture.  X-rays independently reviewed by myself and no obvious fracture or dislocation.  Patient did report some persistent pain after Percocet.  She has both tramadol and Flexeril at home.  Recommend icing and elevating the  extremity.  Continue tramadol and Flexeril at home.  Follow-up with her orthopedist if not improving.  Suspect sprain.  After history, exam, and medical workup I feel the patient has been appropriately medically screened and is safe for discharge home. Pertinent diagnoses were discussed with the patient. Patient was given return precautions.  Final Clinical Impression(s) / ED Diagnoses Final diagnoses:  Fall, initial encounter  Acute pain of left knee    Rx / DC Orders ED Discharge Orders    None       Jamesrobert Ohanesian, Barbette Hair, MD 06/20/20 [phone]

## 2020-07-18 DIAGNOSIS — Z905 Acquired absence of kidney: Secondary | ICD-10-CM | POA: Insufficient documentation

## 2020-07-18 DIAGNOSIS — N1832 Chronic kidney disease, stage 3b: Secondary | ICD-10-CM | POA: Insufficient documentation

## 2020-08-24 ENCOUNTER — Encounter (HOSPITAL_BASED_OUTPATIENT_CLINIC_OR_DEPARTMENT_OTHER): Payer: Self-pay

## 2020-08-24 ENCOUNTER — Emergency Department (HOSPITAL_BASED_OUTPATIENT_CLINIC_OR_DEPARTMENT_OTHER): Payer: Medicare Other

## 2020-08-24 ENCOUNTER — Other Ambulatory Visit: Payer: Self-pay

## 2020-08-24 ENCOUNTER — Emergency Department (HOSPITAL_BASED_OUTPATIENT_CLINIC_OR_DEPARTMENT_OTHER)
Admission: EM | Admit: 2020-08-24 | Discharge: 2020-08-24 | Disposition: A | Discharge status: Home / Self Care | Payer: Medicare Other | Attending: Emergency Medicine | Admitting: Emergency Medicine

## 2020-08-24 DIAGNOSIS — M25571 Pain in right ankle and joints of right foot: Secondary | ICD-10-CM | POA: Diagnosis not present

## 2020-08-24 DIAGNOSIS — M79672 Pain in left foot: Secondary | ICD-10-CM | POA: Insufficient documentation

## 2020-08-24 DIAGNOSIS — Z8553 Personal history of malignant neoplasm of renal pelvis: Secondary | ICD-10-CM | POA: Insufficient documentation

## 2020-08-24 DIAGNOSIS — J45909 Unspecified asthma, uncomplicated: Secondary | ICD-10-CM | POA: Insufficient documentation

## 2020-08-24 DIAGNOSIS — Z7951 Long term (current) use of inhaled steroids: Secondary | ICD-10-CM | POA: Insufficient documentation

## 2020-08-24 DIAGNOSIS — I10 Essential (primary) hypertension: Secondary | ICD-10-CM | POA: Insufficient documentation

## 2020-08-24 DIAGNOSIS — Z96652 Presence of left artificial knee joint: Secondary | ICD-10-CM | POA: Diagnosis not present

## 2020-08-24 DIAGNOSIS — Z79899 Other long term (current) drug therapy: Secondary | ICD-10-CM | POA: Diagnosis not present

## 2020-08-24 DIAGNOSIS — Y9301 Activity, walking, marching and hiking: Secondary | ICD-10-CM | POA: Diagnosis not present

## 2020-08-24 DIAGNOSIS — E119 Type 2 diabetes mellitus without complications: Secondary | ICD-10-CM | POA: Diagnosis not present

## 2020-08-24 DIAGNOSIS — Z7984 Long term (current) use of oral hypoglycemic drugs: Secondary | ICD-10-CM | POA: Diagnosis not present

## 2020-08-24 DIAGNOSIS — Z950 Presence of cardiac pacemaker: Secondary | ICD-10-CM | POA: Diagnosis not present

## 2020-08-24 DIAGNOSIS — Z7982 Long term (current) use of aspirin: Secondary | ICD-10-CM | POA: Diagnosis not present

## 2020-08-24 DIAGNOSIS — W109XXA Fall (on) (from) unspecified stairs and steps, initial encounter: Secondary | ICD-10-CM | POA: Diagnosis not present

## 2020-08-24 NOTE — Discharge Instructions (Signed)
Your x-ray does not show an acute fracture.  Please follow-up with your family doctor in the office.

## 2020-08-24 NOTE — ED Triage Notes (Signed)
Pt arrives from home with c/o pain to left foot and right ankle after fall backwards down 3 steps in her home yesterday unsure of what made her fall, denies blood thinners, denies LOC, states "may have bumped my head".

## 2020-08-24 NOTE — ED Provider Notes (Signed)
[facility] EMERGENCY DEPARTMENT Provider Note   CSN: [id number] Arrival date & time: 08/24/20  0734     History Chief Complaint  Patient presents with  . Fall    Cathy Richardson is a 80 y.o. female.  80 yo F with a chief complaints of a fall.  The patient was walking up the stairs and lost her balance and fell down backwards.  She denies head injury denies loss of consciousness complaining of pain to the left foot and the right ankle.  The fall happened yesterday.  She has been able to ambulate but with some pain.  She thinks they are mildly swollen.  Denies pain to the knees or the hips.  The history is provided by the patient.  Fall This is a new problem. The current episode started yesterday. The problem occurs constantly. The problem has not changed since onset.Pertinent negatives include no chest pain, no headaches and no shortness of breath. The symptoms are aggravated by bending, twisting and walking. Nothing relieves the symptoms. She has tried nothing for the symptoms. The treatment provided no relief.       Past Medical History:  Diagnosis Date  . Anemia   . Arthritis   . Asthma   . Cancer [facility])    History of Renal Cancer  . Chronic back pain   . Depression   . Diabetes mellitus without complication ([place])   . Excessive somnolence disorder   . Family history of adverse reaction to anesthesia    son has problems with nausea   . GERD (gastroesophageal reflux disease)   . Glaucoma    pt denies on preop visit of 01/03/2015   . Headache   . Heart murmur   . High cholesterol   . History of hiatal hernia   . Hypertension   . Osteoporosis   . Peptic ulcer   . Renal disorder    only one kidney   . Sleep apnea    cpap- does not know settings   . Urinary tract infection    hx     Patient Active Problem List   Diagnosis Date Noted  . 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 Hypertension 11/12/2015  . GERD (gastroesophageal reflux disease)  11/12/2015  . Diabetes mellitus without complication ([place]) [id number]  . Vitamin D deficiency 11/12/2015  . High cholesterol 11/12/2015  . Depression 11/12/2015  . Chronic pain syndrome 11/12/2015  . Asthma 11/12/2015  . Sublingual gland swelling 01/05/2015  . Prolapsed sublingual gland 12/30/2014    Past Surgical History:  Procedure Laterality Date  . bowel obstruction surgery      x 2   . Carpule Tunnel Release Bilateral   . CHOLECYSTECTOMY    . JOINT REPLACEMENT     left knee replacement   . KNEE ARTHROCENTESIS     left knee  . NEPHRECTOMY    . PACEMAKER IMPLANT    . Removal of Skin Cancer  N/A    Forehead  . SKIN SPLIT GRAFT N/A 01/05/2015   Procedure: MANDIBULAR LINGUAL SPLIT THICKNESS SKIN GRAFT VESTIVULOPLASTY;  Surgeon: Michaela Corner, DDS;  Location: [facility] ORS;  Service: Oral Surgery;  Laterality: N/A;     OB History    Gravida  7   Para  7   Term  6   Preterm  1   AB      Living  5     SAB      IAB  Ectopic      Multiple      Live Births              No family history on file.  Social History   Tobacco Use  . Smoking status: Never Smoker  . Smokeless tobacco: Never Used  Vaping Use  . Vaping Use: Never used  Substance Use Topics  . Alcohol use: No  . Drug use: No    Home Medications Prior to Admission medications   Medication Sig Start Date End Date Taking? Authorizing Provider  acetaminophen (TYLENOL) 500 MG tablet Take 1,000 mg by mouth every 6 (six) hours as needed.  11/04/15   [provider]  albuterol (PROVENTIL HFA) 108 (90 Base) MCG/ACT inhaler 2 puffs every 6 (six) hours as needed.  09/26/15   [provider]  albuterol (VENTOLIN HFA) 108 (90 Base) MCG/ACT inhaler Inhale into the lungs. 02/22/20   [provider]  aspirin EC 81 MG tablet Take 81 mg by mouth daily.     [provider]  atorvastatin (LIPITOR) 20 MG tablet Take 20 mg by mouth daily.     [provider]   atorvastatin (LIPITOR) 20 MG tablet Take 1 tablet by mouth at bedtime. 12/21/19   [provider]  calcium carbonate (OS-CAL) 1250 (500 Ca) MG chewable tablet Chew 2 tablets by mouth daily.    [provider]  calcium carbonate (TUMS - DOSED IN MG ELEMENTAL CALCIUM) 500 MG chewable tablet Chew 2 tablets by mouth every morning.     [provider]  cephALEXin (KEFLEX) 500 MG capsule Take 1 capsule (500 mg total) by mouth 2 (two) times daily. 05/11/18   Orlie Dakin, MD  cephALEXin (KEFLEX) 500 MG capsule Take 1 capsule (500 mg total) by mouth 2 (two) times daily. 05/11/18   Orlie Dakin, MD  chlorhexidine (PERIDEX) 0.12 % solution 15 mLs. 12/02/14   [provider]  Cholecalciferol (VITAMIN D3 SUPER STRENGTH) 2000 units TABS Take 2,000 Units by mouth daily.     [provider]  Cholecalciferol 50 MCG (2000 UT) TABS Take 1 tablet by mouth daily.    [provider]  cyclobenzaprine (FLEXERIL) 10 MG tablet Take 10 mg by mouth daily as needed.  05/23/13   [provider]  diphenhydrAMINE (SOMINEX) 25 MG tablet Take by mouth.    [provider]  escitalopram (LEXAPRO) 10 MG tablet 10 mg daily.  09/09/15   [provider]  escitalopram (LEXAPRO) 20 MG tablet Take 1 tablet by mouth daily. 12/22/19   [provider]  fluticasone (FLONASE) 50 MCG/ACT nasal spray Place 2 sprays into both nostrils daily.  08/12/15   [provider]  furosemide (LASIX) 40 MG tablet Take 40 mg by mouth daily.     [provider]  lidocaine (LIDODERM) 5 % Place onto the skin. 09/01/15   [provider]  lidocaine (LIDODERM) 5 % See admin instructions. 04/18/17   [provider]  lisinopril (PRINIVIL,ZESTRIL) 5 MG tablet Take 5 mg by mouth daily.    [provider]  lisinopril (ZESTRIL) 5 MG tablet Take 1 tablet by mouth daily. 08/25/19   [provider]  metFORMIN (GLUCOPHAGE-XR) 500 MG 24  hr tablet Take 500 mg by mouth daily before supper.    [provider]  methadone (DOLOPHINE) 10 MG tablet Take 5 mg by mouth at bedtime.  10/04/15   [provider]  metoprolol tartrate (LOPRESSOR) 50 MG tablet Take 1 tablet  by mouth 3 (three) times daily. 11/23/19   [provider]  mometasone-formoterol (DULERA) 200-5 MCG/ACT AERO Inhale 2 puffs into the lungs 2 (two) times daily as needed.     [provider]  Multiple Vitamin (MULTIVITAMIN) tablet Take by mouth.    [provider]  Omega-3 Fatty Acids (FISH OIL) 1000 MG CAPS Take 1 capsule by mouth daily.    [provider]  Omega-3 Fatty Acids (OMEGA-3 FISH OIL) 300 MG CAPS Take 1 tablet by mouth at bedtime.    [provider]  ondansetron (ZOFRAN ODT) 4 MG disintegrating tablet Take 1 tablet (4 mg total) by mouth every 8 (eight) hours as needed for nausea or vomiting. 05/21/18   Horton, Barbette Hair, MD  ondansetron (ZOFRAN) 4 MG tablet Take 4 mg by mouth every 6 (six) hours as needed.  12/07/14   [provider]  Oxycodone HCl 20 MG TABS Take 20 mg by mouth every 4 (four) hours as needed.  11/04/15   [provider]  pantoprazole (PROTONIX) 40 MG tablet Take 40 mg by mouth. 12/01/14   [provider]  pantoprazole (PROTONIX) 40 MG tablet Take by mouth. 12/30/19   [provider]  phenylephrine (SUDAFED PE) 10 MG TABS tablet Take by mouth.    [provider]  potassium chloride (K-DUR,KLOR-CON) 10 MEQ tablet 20 mEq 2 (two) times daily.  07/20/15   [provider]  pramipexole (MIRAPEX) 0.125 MG tablet TAKE 1 TO 2 TABLETS BY MOUTH NIGHTLY 01/26/20   [provider]  silver sulfADIAZINE (SILVADENE) 1 % cream Apply 1 application topically 2 (two) times daily as needed (rash).     [provider]  traMADol (ULTRAM) 50 MG tablet Take 1 tablet by mouth every 8 (eight) hours as needed. 02/22/20   [provider]     Allergies    Gabapentin, Fluoxetine, Iodine-131, Naproxen, Reglan [metoclopramide], Tizanidine, and Ivp dye [iodinated diagnostic agents]  Review of Systems   Review of Systems  Constitutional: Negative for chills and fever.  HENT: Negative for congestion and rhinorrhea.   Eyes: Negative for redness and visual disturbance.  Respiratory: Negative for shortness of breath and wheezing.   Cardiovascular: Negative for chest pain and palpitations.  Gastrointestinal: Negative for nausea and vomiting.  Genitourinary: Negative for dysuria and urgency.  Musculoskeletal: Positive for arthralgias and myalgias.  Skin: Negative for pallor and wound.  Neurological: Negative for dizziness and headaches.    Physical Exam Updated Vital Signs BP 121/60 (BP Location: Right Arm)   Pulse 62   Temp 98.2 F (36.8 C) (Oral)   Resp 19   Ht 5\' 4"  (1.626 m)   Wt 96.2 kg   SpO2 94%   BMI 36.39 kg/m   Physical Exam Vitals and nursing note reviewed.  Constitutional:      General: She is not in acute distress.    Appearance: She is well-developed and well-nourished. She is not diaphoretic.  HENT:     Head: Normocephalic and atraumatic.  Eyes:     Extraocular Movements: EOM normal.     Pupils: Pupils are equal, round, and reactive to light.  Cardiovascular:     Rate and Rhythm: Normal rate and regular rhythm.     Heart sounds: No murmur heard. No friction rub. No gallop.   Pulmonary:     Effort: Pulmonary effort is normal.     Breath sounds: No wheezing or rales.  Abdominal:     General: There is no distension.  Palpations: Abdomen is soft.     Tenderness: There is no abdominal tenderness.  Musculoskeletal:        General: Tenderness present. No edema.     Cervical back: Normal range of motion and neck supple.     Comments: Mild tenderness about the lateral aspect of the left foot.  No appreciable bruising or deformity.  No crepitus.  Pulse motor and sensation are intact distally.  No  pain at the ankle.  Pain along the attachment of the ATF to the lateral malleolus of the right ankle.  No pain to the navicular or the base of the fifth metatarsal.  Pulse motor and sensation are intact distally.  No pain at the knee.  The patient was palpated from head to toe without any other obvious noted areas of bony tenderness.  Skin:    General: Skin is warm and dry.  Neurological:     Mental Status: She is alert and oriented to person, place, and time.  Psychiatric:        Mood and Affect: Mood and affect normal.        Behavior: Behavior normal.     ED Results / Procedures / Treatments   Labs (all labs ordered are listed, but only abnormal results are displayed) Labs Reviewed - No data to display  EKG None  Radiology DG Ankle Complete Right  Result Date: 08/24/2020 CLINICAL DATA:  Pain after fall EXAM: RIGHT ANKLE - COMPLETE 3+ VIEW COMPARISON:  June 01, 2019 FINDINGS: Frontal, oblique, and lateral views were obtained. There is a focal well corticated calcification along the lateral malleolus consistent with prior avulsion fracture. No acute fracture is evident. No appreciable joint effusion. There is joint space narrowing in the medial aspect of the ankle joint. No erosive change. There are small posterior and inferior calcaneus spurs. There is spurring in the dorsal midfoot. Ankle mortise appears intact. IMPRESSION: Old healed lateral malleolar fracture. No acute fracture. 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 Small calcaneal spurs noted. Ankle mortise appears intact. Electronically Signed   By: Lowella Grip III M.D.   On: 08/24/2020 09:56   DG Foot Complete Left  Result Date: 08/24/2020 CLINICAL DATA:  Fall.  Left foot pain. EXAM: LEFT FOOT - COMPLETE 3+ VIEW COMPARISON:  02/10/2017. FINDINGS: Diffuse osteopenia. Degenerative change first MTP joint and left ankle again noted. Tiny fracture fragment at the base of the  proximal phalanx of the left great toe cannot be excluded. This is most likely old. Previously identified distal tuft fracture of the distal phalanx of the left great toe has healed. No radiopaque foreign body. IMPRESSION: 1. Tiny fracture fragment at the base of the proximal phalanx of the left great toe cannot be excluded. This is most likely old. 2. Previously identified distal tuft fracture of the distal phalanx of the left great toe has healed. 3. Degenerative change first MTP joint and left ankle again noted. Electronically Signed   By: Marcello Moores  Register   On: 08/24/2020 09:57    Procedures Procedures   Medications Ordered in ED Medications - No data to display  ED Course  I have reviewed the triage vital signs and the nursing notes.  Pertinent labs & imaging results that were available during my care of the patient were reviewed by me and considered in my medical decision making (see chart for details).    MDM Rules/Calculators/A&P  80 yo F with a chief complaints of left foot and right ankle pain.  This is after a nonsyncopal fall yesterday.  Patient with no significant findings on exam.  Plain film viewed by me without obvious fracture.  Radiology read with questionable small fracture of the thought to be old to the base of the fifth metatarsal.  We will have her follow-up with her doctor in the office.  10:19 AM:  I have discussed the diagnosis/risks/treatment options with the patient and believe the pt to be eligible for discharge home to follow-up with PCP. We also discussed returning to the ED immediately if new or worsening sx occur. We discussed the sx which are most concerning (e.g., sudden worsening pain, fever, inability to tolerate by mouth) that necessitate immediate return. Medications administered to the patient during their visit and any new prescriptions provided to the patient are listed below.  Medications given during this visit Medications -  No data to display   The patient appears reasonably screen and/or stabilized for discharge and I doubt any other medical condition or other [facility] requiring further screening, evaluation, or treatment in the ED at this time prior to discharge.   Final Clinical Impression(s) / ED Diagnoses Final diagnoses:  Left foot pain  Acute right ankle pain    Rx / DC Orders ED Discharge Orders    None       Deno Etienne, DO 08/24/20 1019

## 2021-01-18 DIAGNOSIS — E669 Obesity, unspecified: Secondary | ICD-10-CM | POA: Insufficient documentation

## 2021-03-16 ENCOUNTER — Emergency Department (HOSPITAL_BASED_OUTPATIENT_CLINIC_OR_DEPARTMENT_OTHER)
Admission: EM | Admit: 2021-03-16 | Discharge: 2021-03-16 | Disposition: A | Discharge status: Home / Self Care | Payer: Medicare Other | Attending: Emergency Medicine | Admitting: Emergency Medicine

## 2021-03-16 ENCOUNTER — Other Ambulatory Visit (HOSPITAL_BASED_OUTPATIENT_CLINIC_OR_DEPARTMENT_OTHER): Payer: Self-pay

## 2021-03-16 ENCOUNTER — Other Ambulatory Visit: Payer: Self-pay

## 2021-03-16 ENCOUNTER — Encounter (HOSPITAL_BASED_OUTPATIENT_CLINIC_OR_DEPARTMENT_OTHER): Payer: Self-pay | Admitting: *Deleted

## 2021-03-16 ENCOUNTER — Emergency Department (HOSPITAL_BASED_OUTPATIENT_CLINIC_OR_DEPARTMENT_OTHER): Payer: Medicare Other

## 2021-03-16 DIAGNOSIS — J45909 Unspecified asthma, uncomplicated: Secondary | ICD-10-CM | POA: Diagnosis not present

## 2021-03-16 DIAGNOSIS — I10 Essential (primary) hypertension: Secondary | ICD-10-CM | POA: Diagnosis not present

## 2021-03-16 DIAGNOSIS — Z7984 Long term (current) use of oral hypoglycemic drugs: Secondary | ICD-10-CM | POA: Diagnosis not present

## 2021-03-16 DIAGNOSIS — Z85528 Personal history of other malignant neoplasm of kidney: Secondary | ICD-10-CM | POA: Diagnosis not present

## 2021-03-16 DIAGNOSIS — M79672 Pain in left foot: Secondary | ICD-10-CM | POA: Insufficient documentation

## 2021-03-16 DIAGNOSIS — Z79899 Other long term (current) drug therapy: Secondary | ICD-10-CM | POA: Diagnosis not present

## 2021-03-16 DIAGNOSIS — Z7982 Long term (current) use of aspirin: Secondary | ICD-10-CM | POA: Insufficient documentation

## 2021-03-16 DIAGNOSIS — E119 Type 2 diabetes mellitus without complications: Secondary | ICD-10-CM | POA: Diagnosis not present

## 2021-03-16 MED ORDER — DICLOFENAC SODIUM 1 % EX GEL
2.0000 g | Freq: Four times a day (QID) | CUTANEOUS | 0 refills | Status: DC
  Filled 2021-03-16: qty 100, 30d supply, fill #0

## 2021-03-16 NOTE — ED Triage Notes (Signed)
Her left foot started hurting after wearing a new pair of shoes.

## 2021-03-16 NOTE — ED Provider Notes (Signed)
[facility] Provider Note   CSN: [id number] Arrival date & time: 03/16/21  1503     History Chief Complaint  Patient presents with   Foot Pain    Cathy Richardson is a 80 y.o. female.  Cathy Richardson is a 80 y/o female who presents with left foot pain after wearing new pair of shoes. She states her pain began on Sunday (4 days ago) when she started wearing a new pair of house shoes. She stopped wearing those shoes but has persistent pain with walking and placing weight on her left foot. She uses a cane to get around. Denies injury. She has a fentanyl patch for back pain and was worried about taking other pain medications so she has not tried anything for her pain. She has some relief with elevating her foot. Has no other complaints at this time.   The history is provided by the patient.  Foot Pain This is a new problem. The current episode started more than 2 days ago. The problem occurs constantly. The problem has not changed since onset.Pertinent negatives include no chest pain and no shortness of breath. The symptoms are aggravated by walking. The symptoms are relieved by rest and position.      Past Medical History:  Diagnosis Date   Anemia    Arthritis    Asthma    Cancer ([place])    History of Renal Cancer   Chronic back pain    Depression    Diabetes mellitus without complication (HCC)    Excessive somnolence disorder    Family history of adverse reaction to anesthesia    son has problems with nausea    GERD (gastroesophageal reflux disease)    Glaucoma    pt denies on preop visit of 01/03/2015    Headache    Heart murmur    High cholesterol    History of hiatal hernia    Hypertension    Osteoporosis    Peptic ulcer    Renal disorder    only one kidney    Sleep apnea    cpap- does not know settings    Urinary tract infection    hx     Patient Active Problem List   Diagnosis Date Noted   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   Hypertension 11/12/2015   GERD (gastroesophageal reflux disease) 11/12/2015   Diabetes mellitus without complication ([place]) [id number]   Vitamin D deficiency 11/12/2015   High cholesterol 11/12/2015   Depression 11/12/2015   Chronic pain syndrome 11/12/2015   Asthma 11/12/2015   Sublingual gland swelling 01/05/2015   Prolapsed sublingual gland 12/30/2014    Past Surgical History:  Procedure Laterality Date   bowel obstruction surgery      x 2    Carpule Tunnel Release Bilateral    CHOLECYSTECTOMY     JOINT REPLACEMENT     left knee replacement    KNEE ARTHROCENTESIS     left knee   NEPHRECTOMY     PACEMAKER IMPLANT     Removal of Skin Cancer  N/A    Forehead   SKIN SPLIT GRAFT N/A 01/05/2015   Procedure: MANDIBULAR LINGUAL SPLIT THICKNESS SKIN GRAFT VESTIVULOPLASTY;  Surgeon: Michaela Corner, DDS;  Location: [facility];  Service: Oral Surgery;  Laterality: N/A;     OB History     Gravida  7   Para  7   Term  6   Preterm  1   AB  Living  5      SAB      IAB      Ectopic      Multiple      Live Births              No family history on file.  Social History   Tobacco Use   Smoking status: Never   Smokeless tobacco: Never  Vaping Use   Vaping Use: Never used  Substance Use Topics   Alcohol use: No   Drug use: No    Home Medications Prior to Admission medications   Medication Sig Start Date End Date Taking? Authorizing Provider  diclofenac Sodium (VOLTAREN) 1 % GEL Apply 2 g topically 4 (four) times daily. 03/16/21  Yes Goebel Hellums T, PA-C  acetaminophen (TYLENOL) 500 MG tablet Take 1,000 mg by mouth every 6 (six) hours as needed.  11/04/15   [provider]  albuterol (PROVENTIL HFA) 108 (90 Base) MCG/ACT inhaler 2 puffs every 6 (six) hours as needed.  09/26/15   [provider]  albuterol (VENTOLIN HFA) 108 (90 Base) MCG/ACT inhaler Inhale into the lungs. 02/22/20   [provider]  aspirin EC 81 MG  tablet Take 81 mg by mouth daily.     [provider]  atorvastatin (LIPITOR) 20 MG tablet Take 20 mg by mouth daily.     [provider]  atorvastatin (LIPITOR) 20 MG tablet Take 1 tablet by mouth at bedtime. 12/21/19   [provider]  calcium carbonate (OS-CAL) 1250 (500 Ca) MG chewable tablet Chew 2 tablets by mouth daily.    [provider]  calcium carbonate (TUMS - DOSED IN MG ELEMENTAL CALCIUM) 500 MG chewable tablet Chew 2 tablets by mouth every morning.     [provider]  cephALEXin (KEFLEX) 500 MG capsule Take 1 capsule (500 mg total) by mouth 2 (two) times daily. 05/11/18   Orlie Dakin, MD  cephALEXin (KEFLEX) 500 MG capsule Take 1 capsule (500 mg total) by mouth 2 (two) times daily. 05/11/18   Orlie Dakin, MD  chlorhexidine (PERIDEX) 0.12 % solution 15 mLs. 12/02/14   [provider]  Cholecalciferol (VITAMIN D3 SUPER STRENGTH) 2000 units TABS Take 2,000 Units by mouth daily.     [provider]  Cholecalciferol 50 MCG (2000 UT) TABS Take 1 tablet by mouth daily.    [provider]  cyclobenzaprine (FLEXERIL) 10 MG tablet Take 10 mg by mouth daily as needed.  05/23/13   [provider]  diphenhydrAMINE (SOMINEX) 25 MG tablet Take by mouth.    [provider]  escitalopram (LEXAPRO) 10 MG tablet 10 mg daily.  09/09/15   [provider]  escitalopram (LEXAPRO) 20 MG tablet Take 1 tablet by mouth daily. 12/22/19   [provider]  fluticasone (FLONASE) 50 MCG/ACT nasal spray Place 2 sprays into both nostrils daily.  08/12/15   [provider]  furosemide (LASIX) 40 MG tablet Take 40 mg by mouth daily.     [provider]  lidocaine (LIDODERM) 5 % Place onto the skin. 09/01/15   [provider]  lidocaine (LIDODERM) 5 % See admin instructions. 04/18/17   [provider]  lisinopril (PRINIVIL,ZESTRIL) 5 MG tablet Take 5 mg by mouth daily.     [provider]  lisinopril (ZESTRIL) 5 MG tablet Take 1 tablet by mouth daily. 08/25/19   [provider]  metFORMIN (GLUCOPHAGE-XR) 500 MG 24 hr tablet Take 500 mg by  mouth daily before supper.    [provider]  methadone (DOLOPHINE) 10 MG tablet Take 5 mg by mouth at bedtime.  10/04/15   [provider]  metoprolol tartrate (LOPRESSOR) 50 MG tablet Take 1 tablet by mouth 3 (three) times daily. 11/23/19   [provider]  mometasone-formoterol (DULERA) 200-5 MCG/ACT AERO Inhale 2 puffs into the lungs 2 (two) times daily as needed.     [provider]  Multiple Vitamin (MULTIVITAMIN) tablet Take by mouth.    [provider]  Omega-3 Fatty Acids (FISH OIL) 1000 MG CAPS Take 1 capsule by mouth daily.    [provider]  Omega-3 Fatty Acids (OMEGA-3 FISH OIL) 300 MG CAPS Take 1 tablet by mouth at bedtime.    [provider]  ondansetron (ZOFRAN ODT) 4 MG disintegrating tablet Take 1 tablet (4 mg total) by mouth every 8 (eight) hours as needed for nausea or vomiting. 05/21/18   Horton, Barbette Hair, MD  ondansetron (ZOFRAN) 4 MG tablet Take 4 mg by mouth every 6 (six) hours as needed.  12/07/14   [provider]  Oxycodone HCl 20 MG TABS Take 20 mg by mouth every 4 (four) hours as needed.  11/04/15   [provider]  pantoprazole (PROTONIX) 40 MG tablet Take 40 mg by mouth. 12/01/14   [provider]  pantoprazole (PROTONIX) 40 MG tablet Take by mouth. 12/30/19   [provider]  phenylephrine (SUDAFED PE) 10 MG TABS tablet Take by mouth.    [provider]  potassium chloride (K-DUR,KLOR-CON) 10 MEQ tablet 20 mEq 2 (two) times daily.  07/20/15   [provider]  pramipexole (MIRAPEX) 0.125 MG tablet TAKE 1 TO 2 TABLETS BY MOUTH NIGHTLY 01/26/20   [provider]  silver sulfADIAZINE (SILVADENE) 1 % cream Apply 1 application topically 2 (two) times daily as needed (rash).      [provider]  traMADol (ULTRAM) 50 MG tablet Take 1 tablet by mouth every 8 (eight) hours as needed. 02/22/20   [provider]    Allergies    Gabapentin, Fluoxetine, Iodine-131, Naproxen, Reglan [metoclopramide], Tizanidine, and Ivp dye [iodinated diagnostic agents]  Review of Systems   Review of Systems  Constitutional:  Negative for activity change, chills and fever.  Respiratory:  Negative for shortness of breath.   Cardiovascular:  Negative for chest pain.  Musculoskeletal:  Negative for arthralgias and joint swelling.  Neurological:  Negative for weakness.  All other systems reviewed and are negative.  Physical Exam Updated Vital Signs BP (!) 115/50 (BP Location: Left Arm)   Pulse 65   Temp 99.2 F (37.3 C) (Oral)   Resp 20   Ht '5\' 4"'$  (1.626 m)   Wt 96.2 kg   SpO2 95%   BMI 36.40 kg/m   Physical Exam Vitals and nursing note reviewed.  Constitutional:      Appearance: Normal appearance.  HENT:     Head: Normocephalic and atraumatic.  Eyes:     Conjunctiva/sclera: Conjunctivae normal.  Pulmonary:     Effort: Pulmonary effort is normal.  Musculoskeletal:        General: Normal range of motion.     Comments: Some tenderness to palpation over 2nd and 3rd metatarsals of left foot. Full active ROM of toes, foot, and ankle without pain. Sensation in tact. Pulses normal.  Skin:    General: Skin is warm and dry.     Comments: No wounds, sores, or rashes.  Neurological:  General: No focal deficit present.     Mental Status: She is alert.    ED Results / Procedures / Treatments   Labs (all labs ordered are listed, but only abnormal results are displayed) Labs Reviewed - No data to display  EKG None  Radiology DG Foot Complete Left  Result Date: 03/16/2021 CLINICAL DATA:  Left foot on sudden onset pain. Diabetes. No injury. EXAM: LEFT FOOT - COMPLETE 3+ VIEW COMPARISON:  None. FINDINGS: No cortical erosion or destruction. There is no  evidence of fracture or dislocation. Hallux valgus deformity with mild to moderate degenerative changes of the first metatarsophalangeal joint. Mild to moderate degenerative changes of the midfoot. Mild degenerative changes of the distal interphalangeal joints. No aggressive appearing focal bone abnormality. Soft tissues are unremarkable. IMPRESSION: No acute displaced fracture or dislocation. Electronically Signed   By: Iven Finn M.D.   On: 03/16/2021 16:16    Procedures Procedures   Medications Ordered in ED Medications - No data to display  ED Course  I have reviewed the triage vital signs and the nursing notes.  Pertinent labs & imaging results that were available during my care of the patient were reviewed by me and considered in my medical decision making (see chart for details).    MDM Rules/Calculators/A&P                           Patient is 80 y/o female who presents for 4 days of left foot pain. Patient reports wearing new pair of shoes that she thinks have been causing her pain. She reports pain on plantar surface of foot and heel with walking or putting weight on left foot as well as some tenderness over metatarsals on the dorsal side. Pain relieved with elevation. No injury. Full active ROM of toes, foot, and ankle. No rashes or sores on feet. Pulses normal and sensation intact. XR of left foot shows no acute fractures or dislocations. Due to unremarkable physical exam and negative imaging, I do not believe patient requires admission and inpatient treatment for her symptoms. I think she would benefit from Voltaren gel and OTC medications for pain and should follow up with her podiatrist for lingering symptoms. Discharging to home. Patient agreeable to plan.   Final Clinical Impression(s) / ED Diagnoses Final diagnoses:  Foot pain, left    Rx / DC Orders ED Discharge Orders          Ordered    diclofenac Sodium (VOLTAREN) 1 % GEL  4 times daily        03/16/21 1639              Cuauhtemoc Huegel T, PA-C 03/16/21 [place], Adam, DO 03/16/21 2206

## 2021-03-16 NOTE — Discharge Instructions (Addendum)
Your x-ray today showed no fractures or dislocations in your foot. I am writing you a prescription for an analgesic gel you can use on your feet and you can take tylenol over the counter for further pain relief. Follow up with your podiatrist for any lingering symptoms.

## 2021-03-16 NOTE — ED Provider Notes (Signed)
Pt seen in conjunction with L Roemhildt, PA-C. Please see her notes for full history, exam, and plan.   In brief, patient presenting for evaluation of left foot pain.  Has been going on for several days after wearing a new pair shoes.  Continues to have pain despite no longer wearing the shoes.  On exam, patient is neurovascular intact. No swelling or erythema.  X-rays negative.  We will plan for symptomatic management with Voltaren gel and Tylenol, follow-up with podiatry.   Franchot Heidelberg, PA-C 03/16/21 1739    Lennice Sites, DO 03/16/21 2206

## 2021-05-28 ENCOUNTER — Encounter (HOSPITAL_BASED_OUTPATIENT_CLINIC_OR_DEPARTMENT_OTHER): Payer: Self-pay | Admitting: Emergency Medicine

## 2021-05-28 ENCOUNTER — Emergency Department (HOSPITAL_BASED_OUTPATIENT_CLINIC_OR_DEPARTMENT_OTHER): Payer: Medicare Other

## 2021-05-28 ENCOUNTER — Emergency Department (HOSPITAL_BASED_OUTPATIENT_CLINIC_OR_DEPARTMENT_OTHER)
Admission: EM | Admit: 2021-05-28 | Discharge: 2021-05-28 | Disposition: A | Discharge status: Home / Self Care | Payer: Medicare Other | Source: Home / Self Care | Attending: Emergency Medicine | Admitting: Emergency Medicine

## 2021-05-28 ENCOUNTER — Other Ambulatory Visit: Payer: Self-pay

## 2021-05-28 DIAGNOSIS — I10 Essential (primary) hypertension: Secondary | ICD-10-CM | POA: Insufficient documentation

## 2021-05-28 DIAGNOSIS — E119 Type 2 diabetes mellitus without complications: Secondary | ICD-10-CM | POA: Insufficient documentation

## 2021-05-28 DIAGNOSIS — Z85528 Personal history of other malignant neoplasm of kidney: Secondary | ICD-10-CM | POA: Insufficient documentation

## 2021-05-28 DIAGNOSIS — Z7984 Long term (current) use of oral hypoglycemic drugs: Secondary | ICD-10-CM | POA: Insufficient documentation

## 2021-05-28 DIAGNOSIS — J069 Acute upper respiratory infection, unspecified: Secondary | ICD-10-CM | POA: Insufficient documentation

## 2021-05-28 DIAGNOSIS — Z950 Presence of cardiac pacemaker: Secondary | ICD-10-CM | POA: Insufficient documentation

## 2021-05-28 DIAGNOSIS — Z20822 Contact with and (suspected) exposure to covid-19: Secondary | ICD-10-CM | POA: Insufficient documentation

## 2021-05-28 DIAGNOSIS — Z96652 Presence of left artificial knee joint: Secondary | ICD-10-CM | POA: Insufficient documentation

## 2021-05-28 DIAGNOSIS — J45909 Unspecified asthma, uncomplicated: Secondary | ICD-10-CM | POA: Insufficient documentation

## 2021-05-28 DIAGNOSIS — Z79899 Other long term (current) drug therapy: Secondary | ICD-10-CM | POA: Insufficient documentation

## 2021-05-28 DIAGNOSIS — Z7982 Long term (current) use of aspirin: Secondary | ICD-10-CM | POA: Insufficient documentation

## 2021-05-28 DIAGNOSIS — J101 Influenza due to other identified influenza virus with other respiratory manifestations: Secondary | ICD-10-CM | POA: Diagnosis not present

## 2021-05-28 LAB — BASIC METABOLIC PANEL
Anion gap: 8 (ref 5–15)
BUN: 20 mg/dL (ref 8–23)
CO2: 25 mmol/L (ref 22–32)
Calcium: 9.2 mg/dL (ref 8.9–10.3)
Chloride: 104 mmol/L (ref 98–111)
Creatinine, Ser: 1.1 mg/dL — ABNORMAL HIGH (ref 0.44–1.00)
GFR, Estimated: 51 mL/min — ABNORMAL LOW (ref >60)
Glucose, Bld: 113 mg/dL — ABNORMAL HIGH (ref 70–99)
Potassium: 4 mmol/L (ref 3.5–5.1)
Sodium: 137 mmol/L (ref 135–145)

## 2021-05-28 LAB — CBC WITH DIFFERENTIAL/PLATELET
Abs Immature Granulocytes: 0.02 10*3/uL (ref 0.00–0.07)
Basophils Absolute: 0 10*3/uL (ref 0.0–0.1)
Basophils Relative: 0 %
Eosinophils Absolute: 0.1 10*3/uL (ref 0.0–0.5)
Eosinophils Relative: 3 %
HCT: 38.2 % (ref 36.0–46.0)
Hemoglobin: 12.5 g/dL (ref 12.0–15.0)
Immature Granulocytes: 0 %
Lymphocytes Relative: 24 %
Lymphs Abs: 1.1 10*3/uL (ref 0.7–4.0)
MCH: 31.6 pg (ref 26.0–34.0)
MCHC: 32.7 g/dL (ref 30.0–36.0)
MCV: 96.5 fL (ref 80.0–100.0)
Monocytes Absolute: 0.3 10*3/uL (ref 0.1–1.0)
Monocytes Relative: 8 %
Neutro Abs: 2.9 10*3/uL (ref 1.7–7.7)
Neutrophils Relative %: 65 %
Platelets: 253 10*3/uL (ref 150–400)
RBC: 3.96 MIL/uL (ref 3.87–5.11)
RDW: 13.1 % (ref 11.5–15.5)
WBC: 4.5 10*3/uL (ref 4.0–10.5)
nRBC: 0 % (ref 0.0–0.2)

## 2021-05-28 LAB — TROPONIN I (HIGH SENSITIVITY)
Troponin I (High Sensitivity): 3 ng/L (ref <18)
Troponin I (High Sensitivity): 4 ng/L (ref <18)

## 2021-05-28 MED ORDER — ALBUTEROL SULFATE (2.5 MG/3ML) 0.083% IN NEBU: 2.5000 mg | INHALATION_SOLUTION | Freq: Four times a day (QID) | RESPIRATORY_TRACT | 12 refills | Status: DC | PRN

## 2021-05-28 MED ORDER — ALBUTEROL SULFATE HFA 108 (90 BASE) MCG/ACT IN AERS
2.0000 | INHALATION_SPRAY | Freq: Once | RESPIRATORY_TRACT | Status: AC
  Administered 2021-05-28: 2 via RESPIRATORY_TRACT
  Filled 2021-05-28: qty 6.7

## 2021-05-28 MED ORDER — IPRATROPIUM-ALBUTEROL 0.5-2.5 (3) MG/3ML IN SOLN
3.0000 mL | Freq: Once | RESPIRATORY_TRACT | Status: AC
  Administered 2021-05-28: 3 mL via RESPIRATORY_TRACT
  Filled 2021-05-28: qty 3

## 2021-05-28 NOTE — ED Triage Notes (Signed)
Pt reports chest tightness, SHOB since tues-Wednesday of last week. Family member states 2 household members have flu. Pt did receive her flu shot and covid booster on Friday. Pt has coughing fit with every deep breath. RRT to bedside.

## 2021-05-28 NOTE — Discharge Instructions (Addendum)
Use your albuterol as needed for your symptoms. Follow-up with your primary care provider. Will call you if your COVID or flu test are positive in the next few days. Take Tylenol as needed for fever pain. Return to the ER if you start to experience worsening symptoms, chest pain, shortness of breath, worsening wheezing or vomiting.

## 2021-05-28 NOTE — ED Provider Notes (Signed)
[facility] EMERGENCY DEPARTMENT Provider Note   CSN: [id number] Arrival date & time: 05/28/21  1843     History Chief Complaint  Patient presents with   Shortness of Breath    Cathy Richardson is a 80 y.o. female with a past medical history of GERD, diabetes, hypertension presenting to the ED with a chief complaint of chest tightness, shortness of breath.  Symptoms have been going on for about 5 days.  She received her flu vaccine and COVID booster 2 days ago but she did have several household members that were diagnosed with influenza.  She tried taking a cough drops with some improvement in her symptoms.  She denies any abdominal pain, vomiting, chest pain.  Denies significant leg swelling.   Shortness of Breath Associated symptoms: cough   Associated symptoms: no abdominal pain, no chest pain, no ear pain, no fever, no rash, no sore throat, no vomiting and no wheezing       Past Medical History:  Diagnosis Date   Anemia    Arthritis    Asthma    Cancer ([place])    History of Renal Cancer   Chronic back pain    Depression    Diabetes mellitus without complication (HCC)    Excessive somnolence disorder    Family history of adverse reaction to anesthesia    son has problems with nausea    GERD (gastroesophageal reflux disease)    Glaucoma    pt denies on preop visit of 01/03/2015    Headache    Heart murmur    High cholesterol    History of hiatal hernia    Hypertension    Osteoporosis    Peptic ulcer    Renal disorder    only one kidney    Sleep apnea    cpap- does not know settings    Urinary tract infection    hx     Patient Active Problem List   Diagnosis Date Noted   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   Hypertension 11/12/2015   GERD (gastroesophageal reflux disease) 11/12/2015   Diabetes mellitus without complication ([place]) 82/99/3716   Vitamin D deficiency 11/12/2015   High cholesterol 11/12/2015   Depression 11/12/2015    Chronic pain syndrome 11/12/2015   Asthma 11/12/2015   Sublingual gland swelling 01/05/2015   Prolapsed sublingual gland 12/30/2014    Past Surgical History:  Procedure Laterality Date   bowel obstruction surgery      x 2    Carpule Tunnel Release Bilateral    CHOLECYSTECTOMY     JOINT REPLACEMENT     left knee replacement    KNEE ARTHROCENTESIS     left knee   NEPHRECTOMY     PACEMAKER IMPLANT     Removal of Skin Cancer  N/A    Forehead   SKIN SPLIT GRAFT N/A 01/05/2015   Procedure: MANDIBULAR LINGUAL SPLIT THICKNESS SKIN GRAFT VESTIVULOPLASTY;  Surgeon: Michaela Corner, DDS;  Location: [facility];  Service: Oral Surgery;  Laterality: N/A;     OB History     Gravida  7   Para  7   Term  6   Preterm  1   AB      Living  5      SAB      IAB      Ectopic      Multiple      Live Births  No family history on file.  Social History   Tobacco Use   Smoking status: Never   Smokeless tobacco: Never  Vaping Use   Vaping Use: Never used  Substance Use Topics   Alcohol use: No   Drug use: No    Home Medications Prior to Admission medications   Medication Sig Start Date End Date Taking? Authorizing Provider  albuterol (PROVENTIL) (2.5 MG/3ML) 0.083% nebulizer solution Take 3 mLs (2.5 mg total) by nebulization every 6 (six) hours as needed for wheezing or shortness of breath. 05/28/21  Yes Seraphine Gudiel, PA-C  acetaminophen (TYLENOL) 500 MG tablet Take 1,000 mg by mouth every 6 (six) hours as needed.  11/04/15   [provider]  aspirin EC 81 MG tablet Take 81 mg by mouth daily.     [provider]  atorvastatin (LIPITOR) 20 MG tablet Take 20 mg by mouth daily.     [provider]  atorvastatin (LIPITOR) 20 MG tablet Take 1 tablet by mouth at bedtime. 12/21/19   [provider]  calcium carbonate (OS-CAL) 1250 (500 Ca) MG chewable tablet Chew 2 tablets by mouth daily.    [provider]  calcium carbonate  (TUMS - DOSED IN MG ELEMENTAL CALCIUM) 500 MG chewable tablet Chew 2 tablets by mouth every morning.     [provider]  cephALEXin (KEFLEX) 500 MG capsule Take 1 capsule (500 mg total) by mouth 2 (two) times daily. 05/11/18   Orlie Dakin, MD  cephALEXin (KEFLEX) 500 MG capsule Take 1 capsule (500 mg total) by mouth 2 (two) times daily. 05/11/18   Orlie Dakin, MD  chlorhexidine (PERIDEX) 0.12 % solution 15 mLs. 12/02/14   [provider]  Cholecalciferol (VITAMIN D3 SUPER STRENGTH) 2000 units TABS Take 2,000 Units by mouth daily.     [provider]  Cholecalciferol 50 MCG (2000 UT) TABS Take 1 tablet by mouth daily.    [provider]  cyclobenzaprine (FLEXERIL) 10 MG tablet Take 10 mg by mouth daily as needed.  05/23/13   [provider]  diclofenac Sodium (VOLTAREN) 1 % GEL Apply 2 g topically 4 (four) times daily. 03/16/21   Roemhildt, Lorin T, PA-C  diphenhydrAMINE (SOMINEX) 25 MG tablet Take by mouth.    [provider]  escitalopram (LEXAPRO) 10 MG tablet 10 mg daily.  09/09/15   [provider]  escitalopram (LEXAPRO) 20 MG tablet Take 1 tablet by mouth daily. 12/22/19   [provider]  fluticasone (FLONASE) 50 MCG/ACT nasal spray Place 2 sprays into both nostrils daily.  08/12/15   [provider]  furosemide (LASIX) 40 MG tablet Take 40 mg by mouth daily.     [provider]  lidocaine (LIDODERM) 5 % Place onto the skin. 09/01/15   [provider]  lidocaine (LIDODERM) 5 % See admin instructions. 04/18/17   [provider]  lisinopril (PRINIVIL,ZESTRIL) 5 MG tablet Take 5 mg by mouth daily.    [provider]  lisinopril (ZESTRIL) 5 MG tablet Take 1 tablet by mouth daily. 08/25/19   [provider]  metFORMIN (GLUCOPHAGE-XR) 500 MG 24 hr tablet Take 500 mg by mouth daily before supper.    [provider]  methadone (DOLOPHINE) 10 MG tablet Take 5 mg by  mouth at bedtime.  10/04/15   [provider]  metoprolol tartrate (LOPRESSOR) 50 MG tablet Take 1 tablet by mouth 3 (three) times daily. 11/23/19   [provider]  mometasone-formoterol (DULERA) 200-5  MCG/ACT AERO Inhale 2 puffs into the lungs 2 (two) times daily as needed.     [provider]  Multiple Vitamin (MULTIVITAMIN) tablet Take by mouth.    [provider]  Omega-3 Fatty Acids (FISH OIL) 1000 MG CAPS Take 1 capsule by mouth daily.    [provider]  Omega-3 Fatty Acids (OMEGA-3 FISH OIL) 300 MG CAPS Take 1 tablet by mouth at bedtime.    [provider]  ondansetron (ZOFRAN ODT) 4 MG disintegrating tablet Take 1 tablet (4 mg total) by mouth every 8 (eight) hours as needed for nausea or vomiting. 05/21/18   Horton, Barbette Hair, MD  ondansetron (ZOFRAN) 4 MG tablet Take 4 mg by mouth every 6 (six) hours as needed.  12/07/14   [provider]  Oxycodone HCl 20 MG TABS Take 20 mg by mouth every 4 (four) hours as needed.  11/04/15   [provider]  pantoprazole (PROTONIX) 40 MG tablet Take 40 mg by mouth. 12/01/14   [provider]  pantoprazole (PROTONIX) 40 MG tablet Take by mouth. 12/30/19   [provider]  phenylephrine (SUDAFED PE) 10 MG TABS tablet Take by mouth.    [provider]  potassium chloride (K-DUR,KLOR-CON) 10 MEQ tablet 20 mEq 2 (two) times daily.  07/20/15   [provider]  pramipexole (MIRAPEX) 0.125 MG tablet TAKE 1 TO 2 TABLETS BY MOUTH NIGHTLY 01/26/20   [provider]  silver sulfADIAZINE (SILVADENE) 1 % cream Apply 1 application topically 2 (two) times daily as needed (rash).     [provider]  traMADol (ULTRAM) 50 MG tablet Take 1 tablet by mouth every 8 (eight) hours as needed. 02/22/20   [provider]    Allergies    Gabapentin, Fluoxetine, Iodine-131, Naproxen, Reglan [metoclopramide], Tizanidine, and Ivp dye [iodinated diagnostic  agents]  Review of Systems   Review of Systems  Constitutional:  Negative for appetite change, chills and fever.  HENT:  Negative for ear pain, rhinorrhea, sneezing and sore throat.   Eyes:  Negative for photophobia and visual disturbance.  Respiratory:  Positive for cough, chest tightness and shortness of breath. Negative for wheezing.   Cardiovascular:  Negative for chest pain and palpitations.  Gastrointestinal:  Negative for abdominal pain, blood in stool, constipation, diarrhea, nausea and vomiting.  Genitourinary:  Negative for dysuria, hematuria and urgency.  Musculoskeletal:  Negative for myalgias.  Skin:  Negative for rash.  Neurological:  Negative for dizziness, weakness and light-headedness.   Physical Exam Updated Vital Signs BP 137/65   Pulse 64   Temp 98.7 F (37.1 C) (Oral)   Resp 18   Ht 5\' 4"  (1.626 m)   Wt 102.1 kg   SpO2 96%   BMI 38.62 kg/m   Physical Exam Vitals and nursing note reviewed.  Constitutional:      General: She is not in acute distress.    Appearance: She is well-developed.  HENT:     Head: Normocephalic and atraumatic.     Nose: Nose normal.  Eyes:     General: No scleral icterus.       Left eye: No discharge.     Conjunctiva/sclera: Conjunctivae normal.  Cardiovascular:     Rate and Rhythm: Normal rate and regular rhythm.     Heart sounds: Normal heart sounds. No murmur heard.   No friction rub. No gallop.  Pulmonary:     Effort: Pulmonary effort is normal. No respiratory distress.  Breath sounds: Normal breath sounds.  Abdominal:     General: Bowel sounds are normal. There is no distension.     Palpations: Abdomen is soft.     Tenderness: There is no abdominal tenderness. There is no guarding.  Musculoskeletal:        General: Normal range of motion.     Cervical back: Normal range of motion and neck supple.  Skin:    General: Skin is warm and dry.     Findings: No rash.  Neurological:     Mental Status: She is alert.      Motor: No abnormal muscle tone.     Coordination: Coordination normal.    ED Results / Procedures / Treatments   Labs (all labs ordered are listed, but only abnormal results are displayed) Labs Reviewed  BASIC METABOLIC PANEL - Abnormal; Notable for the following components:      Result Value   Glucose, Bld 113 (*)    Creatinine, Ser 1.10 (*)    GFR, Estimated 51 (*)    All other components within normal limits  RESP PANEL BY RT-PCR (FLU A&B, COVID) ARPGX2  CBC WITH DIFFERENTIAL/PLATELET  TROPONIN I (HIGH SENSITIVITY)  TROPONIN I (HIGH SENSITIVITY)    EKG EKG Interpretation  Date/Time:  Sunday May 28 2021 19:22:53 EDT Ventricular Rate:  73 PR Interval:  177 QRS Duration: 99 QT Interval:  406 QTC Calculation: 448 R Axis:   30 Text Interpretation: Sinus rhythm Abnormal R-wave progression, early transition Minimal ST depression when compared to prior, she is no in sinus rhythm Confirmed by Dykstra, Richard ([id number]) on 05/28/2021 11:01:50 PM  Radiology DG Chest 2 View  Result Date: 05/28/2021 CLINICAL DATA:  Initial evaluation for acute shortness of breath. EXAM: CHEST - 2 VIEW COMPARISON:  Prior radiograph from 08/04/2020. FINDINGS: Left-sided pacemaker/AICD in place. Mild cardiomegaly, stable. Mediastinal silhouette normal. Aortic atherosclerosis. Lungs normally inflated. No focal infiltrates. No pulmonary edema or pleural effusion. No pneumothorax. Prominent thoracic dextroscoliosis with multilevel degenerative spondylosis. Multiple surgical clips overlie the upper abdomen. IMPRESSION: 1. No active cardiopulmonary disease. 2.  Aortic Atherosclerosis (ICD10-I70.0). Electronically Signed   By: Benjamin  McClintock M.D.   On: 05/28/2021 20:02    Procedures Procedures   Medications Ordered in ED Medications  albuterol (VENTOLIN HFA) 108 (90 Base) MCG/ACT inhaler 2 puff (has no administration in time range)  ipratropium-albuterol (DUONEB) 0.5-2.5 (3) MG/3ML nebulizer  solution 3 mL (3 mLs Nebulization Given 05/28/21 2133)    ED Course  I have reviewed the triage vital signs and the nursing notes.  Pertinent labs & imaging results that were available during my care of the patient were reviewed by me and considered in my medical decision making (see chart for details).  Clinical Course as of 05/28/21 2303  Sun May 28, 2021  2041 Troponin I (High Sensitivity): 3 [HK]  2042 WBC: 4.5 [HK]  2126 Creatinine(!): 1.10 [HK]  2126 Sodium: 137 [HK]    Clinical Course User Index [HK] Calahan Pak, PA-C   MDM Rules/Calculators/A&P                           80  year old female presenting to the ED with a chief complaint of shortness of breath, chest tightness and cough.  Symptoms have been going on for the past few days.  Several family members tested positive for influenza.  She herself received her influenza vaccine and COVID booster 2 days ago.  On exam lungs are  clear.  Oxygen saturations are 96% on room air.  No lower extremity edema or calf tenderness bilaterally.  Will obtain lab work, EKG, chest x-ray and reassess.  EKG shows sinus rhythm, no ischemic changes, no STEMI.  Chest x-ray without any acute findings.  BMP, CBC unremarkable.  Troponin is negative.  Respiratory panel is pending.  Patient was given a breathing treatment with significant improvement in her symptoms.  Lung sounds remain clear but she states that she feels more comfortable.  Oxygen saturations are 94% on room air without signs of distress.  I suspect that her symptoms are viral in nature.  We will treat with continued albuterol as needed at home as she is requesting a refill of her inhaler.  Tylenol as needed for pain or fever.  We will have her follow-up with results of respiratory panel.  I doubt ACS as the cause of her symptoms based on her reassuring work-up.  No structural cause such as pneumonia seen on imaging that would require antibiotic therapy.  Patient is agreeable to the plan.   Return precautions given.    Patient is hemodynamically stable, in NAD, and able to ambulate in the ED. Evaluation does not show pathology that would require ongoing emergent intervention or inpatient treatment. I explained the diagnosis to the patient. Pain has been managed and has no complaints prior to discharge. Patient is comfortable with above plan and is stable for discharge at this time. All questions were answered prior to disposition. Strict return precautions for returning to the ED were discussed. Encouraged follow up with PCP.   An After Visit Summary was printed and given to the patient.   Portions of this note were generated with [employer]. Dictation errors may occur despite best attempts at proofreading.  Final Clinical Impression(s) / ED Diagnoses Final diagnoses:  Viral upper respiratory illness    Rx / DC Orders ED Discharge Orders          Ordered    albuterol (PROVENTIL) (2.5 MG/3ML) 0.083% nebulizer solution  Every 6 hours PRN        05/28/21 2302             Delia Heady, PA-C 05/28/21 2304    Lucrezia Starch, MD 05/30/21 2321

## 2021-05-28 NOTE — ED Notes (Signed)
Seen before triage, HR 70, SPO2 97% on r/a, NPC, dry, RR 18.  BBS CTA decreased. Flu Vaccine & Covid booster received Friday.

## 2021-05-29 ENCOUNTER — Inpatient Hospital Stay (HOSPITAL_COMMUNITY)
Admission: EM | Admit: 2021-05-29 | Discharge: 2021-06-02 | DRG: 193 | Disposition: A | Discharge status: Home / Self Care | Payer: Medicare Other | Attending: Internal Medicine | Admitting: Internal Medicine

## 2021-05-29 ENCOUNTER — Other Ambulatory Visit: Payer: Self-pay

## 2021-05-29 ENCOUNTER — Encounter (HOSPITAL_COMMUNITY): Payer: Self-pay

## 2021-05-29 ENCOUNTER — Emergency Department (HOSPITAL_COMMUNITY): Payer: Medicare Other

## 2021-05-29 DIAGNOSIS — K219 Gastro-esophageal reflux disease without esophagitis: Secondary | ICD-10-CM | POA: Diagnosis present

## 2021-05-29 DIAGNOSIS — Z85038 Personal history of other malignant neoplasm of large intestine: Secondary | ICD-10-CM

## 2021-05-29 DIAGNOSIS — Z9109 Other allergy status, other than to drugs and biological substances: Secondary | ICD-10-CM

## 2021-05-29 DIAGNOSIS — J9811 Atelectasis: Secondary | ICD-10-CM | POA: Diagnosis present

## 2021-05-29 DIAGNOSIS — J101 Influenza due to other identified influenza virus with other respiratory manifestations: Principal | ICD-10-CM | POA: Diagnosis present

## 2021-05-29 DIAGNOSIS — G4730 Sleep apnea, unspecified: Secondary | ICD-10-CM | POA: Diagnosis present

## 2021-05-29 DIAGNOSIS — Z6838 Body mass index (BMI) 38.0-38.9, adult: Secondary | ICD-10-CM

## 2021-05-29 DIAGNOSIS — Z833 Family history of diabetes mellitus: Secondary | ICD-10-CM

## 2021-05-29 DIAGNOSIS — I129 Hypertensive chronic kidney disease with stage 1 through stage 4 chronic kidney disease, or unspecified chronic kidney disease: Secondary | ICD-10-CM | POA: Diagnosis present

## 2021-05-29 DIAGNOSIS — M810 Age-related osteoporosis without current pathological fracture: Secondary | ICD-10-CM | POA: Diagnosis present

## 2021-05-29 DIAGNOSIS — Z85828 Personal history of other malignant neoplasm of skin: Secondary | ICD-10-CM

## 2021-05-29 DIAGNOSIS — Z823 Family history of stroke: Secondary | ICD-10-CM

## 2021-05-29 DIAGNOSIS — Z91041 Radiographic dye allergy status: Secondary | ICD-10-CM

## 2021-05-29 DIAGNOSIS — E1122 Type 2 diabetes mellitus with diabetic chronic kidney disease: Secondary | ICD-10-CM | POA: Diagnosis present

## 2021-05-29 DIAGNOSIS — Z7984 Long term (current) use of oral hypoglycemic drugs: Secondary | ICD-10-CM

## 2021-05-29 DIAGNOSIS — I959 Hypotension, unspecified: Secondary | ICD-10-CM | POA: Diagnosis not present

## 2021-05-29 DIAGNOSIS — Z96652 Presence of left artificial knee joint: Secondary | ICD-10-CM | POA: Diagnosis present

## 2021-05-29 DIAGNOSIS — Z8249 Family history of ischemic heart disease and other diseases of the circulatory system: Secondary | ICD-10-CM

## 2021-05-29 DIAGNOSIS — Z20822 Contact with and (suspected) exposure to covid-19: Secondary | ICD-10-CM | POA: Diagnosis present

## 2021-05-29 DIAGNOSIS — Z85528 Personal history of other malignant neoplasm of kidney: Secondary | ICD-10-CM

## 2021-05-29 DIAGNOSIS — Z79891 Long term (current) use of opiate analgesic: Secondary | ICD-10-CM

## 2021-05-29 DIAGNOSIS — N179 Acute kidney failure, unspecified: Secondary | ICD-10-CM | POA: Diagnosis not present

## 2021-05-29 DIAGNOSIS — N1831 Chronic kidney disease, stage 3a: Secondary | ICD-10-CM | POA: Diagnosis present

## 2021-05-29 DIAGNOSIS — E7800 Pure hypercholesterolemia, unspecified: Secondary | ICD-10-CM | POA: Diagnosis present

## 2021-05-29 DIAGNOSIS — M549 Dorsalgia, unspecified: Secondary | ICD-10-CM | POA: Diagnosis present

## 2021-05-29 DIAGNOSIS — Z888 Allergy status to other drugs, medicaments and biological substances status: Secondary | ICD-10-CM

## 2021-05-29 DIAGNOSIS — H409 Unspecified glaucoma: Secondary | ICD-10-CM | POA: Diagnosis present

## 2021-05-29 DIAGNOSIS — Z7982 Long term (current) use of aspirin: Secondary | ICD-10-CM

## 2021-05-29 DIAGNOSIS — Z950 Presence of cardiac pacemaker: Secondary | ICD-10-CM

## 2021-05-29 DIAGNOSIS — Z905 Acquired absence of kidney: Secondary | ICD-10-CM

## 2021-05-29 DIAGNOSIS — J9601 Acute respiratory failure with hypoxia: Secondary | ICD-10-CM | POA: Diagnosis present

## 2021-05-29 DIAGNOSIS — J45901 Unspecified asthma with (acute) exacerbation: Secondary | ICD-10-CM | POA: Diagnosis present

## 2021-05-29 DIAGNOSIS — Z9049 Acquired absence of other specified parts of digestive tract: Secondary | ICD-10-CM

## 2021-05-29 DIAGNOSIS — E119 Type 2 diabetes mellitus without complications: Secondary | ICD-10-CM

## 2021-05-29 DIAGNOSIS — J45909 Unspecified asthma, uncomplicated: Secondary | ICD-10-CM | POA: Diagnosis present

## 2021-05-29 DIAGNOSIS — Z79899 Other long term (current) drug therapy: Secondary | ICD-10-CM

## 2021-05-29 DIAGNOSIS — G4733 Obstructive sleep apnea (adult) (pediatric): Secondary | ICD-10-CM | POA: Diagnosis present

## 2021-05-29 DIAGNOSIS — I10 Essential (primary) hypertension: Secondary | ICD-10-CM | POA: Diagnosis present

## 2021-05-29 DIAGNOSIS — I495 Sick sinus syndrome: Secondary | ICD-10-CM | POA: Diagnosis present

## 2021-05-29 DIAGNOSIS — G8929 Other chronic pain: Secondary | ICD-10-CM | POA: Diagnosis present

## 2021-05-29 DIAGNOSIS — Z8711 Personal history of peptic ulcer disease: Secondary | ICD-10-CM

## 2021-05-29 LAB — CBC WITH DIFFERENTIAL/PLATELET
Abs Immature Granulocytes: 0.02 10*3/uL (ref 0.00–0.07)
Basophils Absolute: 0 10*3/uL (ref 0.0–0.1)
Basophils Relative: 0 %
Eosinophils Absolute: 0.1 10*3/uL (ref 0.0–0.5)
Eosinophils Relative: 1 %
HCT: 35.1 % — ABNORMAL LOW (ref 36.0–46.0)
Hemoglobin: 11.1 g/dL — ABNORMAL LOW (ref 12.0–15.0)
Immature Granulocytes: 0 %
Lymphocytes Relative: 17 %
Lymphs Abs: 1.1 10*3/uL (ref 0.7–4.0)
MCH: 31.1 pg (ref 26.0–34.0)
MCHC: 31.6 g/dL (ref 30.0–36.0)
MCV: 98.3 fL (ref 80.0–100.0)
Monocytes Absolute: 0.5 10*3/uL (ref 0.1–1.0)
Monocytes Relative: 8 %
Neutro Abs: 4.5 10*3/uL (ref 1.7–7.7)
Neutrophils Relative %: 74 %
Platelets: 206 10*3/uL (ref 150–400)
RBC: 3.57 MIL/uL — ABNORMAL LOW (ref 3.87–5.11)
RDW: 13.2 % (ref 11.5–15.5)
WBC: 6.1 10*3/uL (ref 4.0–10.5)
nRBC: 0 % (ref 0.0–0.2)

## 2021-05-29 LAB — BASIC METABOLIC PANEL
Anion gap: 6 (ref 5–15)
BUN: 17 mg/dL (ref 8–23)
CO2: 28 mmol/L (ref 22–32)
Calcium: 9.3 mg/dL (ref 8.9–10.3)
Chloride: 104 mmol/L (ref 98–111)
Creatinine, Ser: 1.1 mg/dL — ABNORMAL HIGH (ref 0.44–1.00)
GFR, Estimated: 51 mL/min — ABNORMAL LOW (ref >60)
Glucose, Bld: 122 mg/dL — ABNORMAL HIGH (ref 70–99)
Potassium: 4 mmol/L (ref 3.5–5.1)
Sodium: 138 mmol/L (ref 135–145)

## 2021-05-29 LAB — BRAIN NATRIURETIC PEPTIDE: B Natriuretic Peptide: 181.9 pg/mL — ABNORMAL HIGH (ref 0.0–100.0)

## 2021-05-29 LAB — RESP PANEL BY RT-PCR (FLU A&B, COVID) ARPGX2
Influenza A by PCR: NEGATIVE
Influenza A by PCR: POSITIVE — AB
Influenza B by PCR: NEGATIVE
Influenza B by PCR: NEGATIVE
SARS Coronavirus 2 by RT PCR: NEGATIVE
SARS Coronavirus 2 by RT PCR: NEGATIVE

## 2021-05-29 LAB — LACTIC ACID, PLASMA: Lactic Acid, Venous: 1.2 mmol/L (ref 0.5–1.9)

## 2021-05-29 MED ORDER — OSELTAMIVIR PHOSPHATE 75 MG PO CAPS
75.0000 mg | ORAL_CAPSULE | Freq: Once | ORAL | Status: DC
  Filled 2021-05-29: qty 1

## 2021-05-29 MED ORDER — ALBUTEROL SULFATE HFA 108 (90 BASE) MCG/ACT IN AERS: 1.0000 | INHALATION_SPRAY | RESPIRATORY_TRACT | Status: DC | PRN

## 2021-05-29 MED ORDER — ACETAMINOPHEN 325 MG PO TABS
650.0000 mg | ORAL_TABLET | Freq: Four times a day (QID) | ORAL | Status: DC | PRN
  Administered 2021-05-30 – 2021-06-01 (×2): 650 mg via ORAL
  Filled 2021-05-29 (×2): qty 2

## 2021-05-29 MED ORDER — OSELTAMIVIR PHOSPHATE 30 MG PO CAPS
30.0000 mg | ORAL_CAPSULE | Freq: Two times a day (BID) | ORAL | Status: DC
  Administered 2021-05-29 – 2021-06-02 (×8): 30 mg via ORAL
  Filled 2021-05-29 (×10): qty 1

## 2021-05-29 MED ORDER — ALBUTEROL SULFATE (2.5 MG/3ML) 0.083% IN NEBU
2.5000 mg | INHALATION_SOLUTION | Freq: Once | RESPIRATORY_TRACT | Status: AC
  Administered 2021-05-29: 2.5 mg via RESPIRATORY_TRACT
  Filled 2021-05-29: qty 3

## 2021-05-29 MED ORDER — SODIUM CHLORIDE 0.9 % IV SOLN: INTRAVENOUS | Status: AC

## 2021-05-29 MED ORDER — GUAIFENESIN ER 600 MG PO TB12
600.0000 mg | ORAL_TABLET | Freq: Two times a day (BID) | ORAL | Status: DC
  Administered 2021-05-29 – 2021-06-02 (×8): 600 mg via ORAL
  Filled 2021-05-29 (×8): qty 1

## 2021-05-29 MED ORDER — MOMETASONE FURO-FORMOTEROL FUM 200-5 MCG/ACT IN AERO
2.0000 | INHALATION_SPRAY | Freq: Two times a day (BID) | RESPIRATORY_TRACT | Status: DC
  Administered 2021-05-30 – 2021-06-02 (×6): 2 via RESPIRATORY_TRACT
  Filled 2021-05-29: qty 8.8

## 2021-05-29 MED ORDER — ONDANSETRON HCL 4 MG/2ML IJ SOLN
4.0000 mg | Freq: Four times a day (QID) | INTRAMUSCULAR | Status: DC | PRN
  Administered 2021-06-01 (×2): 4 mg via INTRAVENOUS
  Filled 2021-05-29 (×2): qty 2

## 2021-05-29 MED ORDER — INSULIN ASPART 100 UNIT/ML IJ SOLN
0.0000 [IU] | Freq: Three times a day (TID) | INTRAMUSCULAR | Status: DC
  Administered 2021-05-30 – 2021-05-31 (×4): 2 [IU] via SUBCUTANEOUS
  Administered 2021-05-31 – 2021-06-01 (×2): 1 [IU] via SUBCUTANEOUS
  Administered 2021-06-01 – 2021-06-02 (×2): 2 [IU] via SUBCUTANEOUS
  Filled 2021-05-29: qty 0.09

## 2021-05-29 MED ORDER — ASPIRIN EC 81 MG PO TBEC
81.0000 mg | DELAYED_RELEASE_TABLET | Freq: Every day | ORAL | Status: DC
  Administered 2021-05-30 – 2021-06-02 (×4): 81 mg via ORAL
  Filled 2021-05-29 (×3): qty 1

## 2021-05-29 MED ORDER — ATORVASTATIN CALCIUM 10 MG PO TABS
20.0000 mg | ORAL_TABLET | Freq: Every day | ORAL | Status: DC
  Administered 2021-05-29 – 2021-06-01 (×4): 20 mg via ORAL
  Filled 2021-05-29 (×4): qty 2

## 2021-05-29 MED ORDER — ACETAMINOPHEN 650 MG RE SUPP: 650.0000 mg | Freq: Four times a day (QID) | RECTAL | Status: DC | PRN

## 2021-05-29 MED ORDER — ACETAMINOPHEN 500 MG PO TABS
1000.0000 mg | ORAL_TABLET | Freq: Once | ORAL | Status: AC
  Administered 2021-05-29: 1000 mg via ORAL
  Filled 2021-05-29: qty 2

## 2021-05-29 MED ORDER — ONDANSETRON HCL 4 MG PO TABS
4.0000 mg | ORAL_TABLET | Freq: Four times a day (QID) | ORAL | Status: DC | PRN
  Administered 2021-06-01: 4 mg via ORAL
  Filled 2021-05-29: qty 1

## 2021-05-29 MED ORDER — ENOXAPARIN SODIUM 40 MG/0.4ML IJ SOSY
40.0000 mg | PREFILLED_SYRINGE | INTRAMUSCULAR | Status: DC
  Administered 2021-05-29 – 2021-06-01 (×4): 40 mg via SUBCUTANEOUS
  Filled 2021-05-29 (×4): qty 0.4

## 2021-05-29 MED ORDER — ALBUTEROL SULFATE (2.5 MG/3ML) 0.083% IN NEBU
2.5000 mg | INHALATION_SOLUTION | RESPIRATORY_TRACT | Status: DC | PRN
  Administered 2021-05-30 (×3): 2.5 mg via RESPIRATORY_TRACT
  Filled 2021-05-29 (×3): qty 3

## 2021-05-29 MED ORDER — ALBUTEROL SULFATE (2.5 MG/3ML) 0.083% IN NEBU: 2.5000 mg | INHALATION_SOLUTION | Freq: Four times a day (QID) | RESPIRATORY_TRACT | Status: DC | PRN

## 2021-05-29 NOTE — ED Notes (Signed)
Pt requests to ambulate to the bathroom to void.  Pt helped to the bathroom.

## 2021-05-29 NOTE — H&P (Addendum)
Initial vitals showed History and Physical    Cathy Richardson:[id number] DOB: [birth year] DOA: 05/29/2021  PCP: Cena Benton, MD  Patient coming from: Home via EMS  I have personally briefly reviewed patient's old medical records in [place]  Chief Complaint: Fevers, shortness of breath  HPI: Cathy Richardson is a 80 y.o. female with medical history significant for T2DM, HTN, HLD, asthma, CKD stage IIIa, SSS s/p PPM, renal cancer s/p left nephrectomy, and OSA on CPAP who presents to the ED for evaluation of fevers and shortness of breath.  Patient states she received both the COVID-19 booster and influenza vaccine about 3 days ago.  She says that she has had multiple children in her household recently tested positive for flu.  Yesterday she began experiencing generalized fatigue and weakness, cough, and shortness of breath.  She went to [facility] ED for further evaluation.  When she was in the ED yesterday, she was saturating 94% on room air.  COVID and influenza test were negative.  Labs were reassuring.  Chest x-ray also reassuring.  She was given breathing treatments with improvement in her symptoms.  She was discharged to home with prescription for albuterol nebulizers.  Patient states that since it was so late she was unable to fill her prescription at the pharmacy.  Today she has been having continued weakness, fatigue, cough with chest congestion, nausea without emesis, chills, diaphoresis, and fevers all day.  She was much more weak therefore EMS were called to her home.  Per ED documentation on EMS arrival SPO2 was 88% on room air.  She was noted to have increased work of breathing.  She was given albuterol treatment and 125 mg Solu-Medrol.  She was brought to the ED for further evaluation and management.  Patient otherwise denies any chest pain, abdominal pain, dysuria.  She reports chronic loose stools related to her prior colon cancer treatment which  is unchanged from baseline.  ED Course:  Initial vitals showed BP 127/57, pulse 65, RR 17, temp 1 3.2 F, SPO2 99% on 3 L supplemental O2 via [place] (88% on room air per EDP).  Labs show WBC 6.1, hemoglobin 11.1, platelets 206,000, sodium 138, potassium 4.0, bicarb 28, BUN 17, creatinine 1.10, serum glucose 122, BNP 181.9.  Blood cultures ordered and pending.  SARS-CoV-2 PCR negative.  Influenza A is positive.  Portable chest x-ray shows bibasilar opacities favored to be atelectasis.  The hospitalist service was consulted to admit for further evaluation and management.  Review of Systems: All systems reviewed and are negative except as documented in history of present illness above.   Past Medical History:  Diagnosis Date   Anemia    Arthritis    Asthma    Cancer ([place])    History of Renal Cancer   Chronic back pain    Depression    Diabetes mellitus without complication (HCC)    Excessive somnolence disorder    Family history of adverse reaction to anesthesia    son has problems with nausea    GERD (gastroesophageal reflux disease)    Glaucoma    pt denies on preop visit of 01/03/2015    Headache    Heart murmur    High cholesterol    History of hiatal hernia    Hypertension    Osteoporosis    Peptic ulcer    Renal disorder    only one kidney    Sleep apnea    cpap- does  not know settings    Urinary tract infection    hx     Past Surgical History:  Procedure Laterality Date   bowel obstruction surgery      x 2    Carpule Tunnel Release Bilateral    CHOLECYSTECTOMY     JOINT REPLACEMENT     left knee replacement    KNEE ARTHROCENTESIS     left knee   NEPHRECTOMY     PACEMAKER IMPLANT     Removal of Skin Cancer  N/A    Forehead   SKIN SPLIT GRAFT N/A 01/05/2015   Procedure: MANDIBULAR LINGUAL SPLIT THICKNESS SKIN GRAFT VESTIVULOPLASTY;  Surgeon: Michaela Corner, DDS;  Location: [facility];  Service: Oral Surgery;  Laterality: N/A;    Social History:  reports that  she has never smoked. She has never used smokeless tobacco. She reports that she does not drink alcohol and does not use drugs.  Allergies  Allergen Reactions   Gabapentin Shortness Of Breath   Fluoxetine     Other reaction(s): Other (See Comments) Severe headache   Iodine-131 Itching and Swelling    And swollen eyelids    Naproxen Itching   Reglan [Metoclopramide] Other (See Comments)    dizzy   Tizanidine Other (See Comments)    Confusion    Ivp Dye [Iodinated Diagnostic Agents] Rash    Family History  Problem Relation Age of Onset   Stroke Mother    Diabetes Mother    Hypertension Mother    Stroke Father    Hypertension Father      Prior to Admission medications   Medication Sig Start Date End Date Taking? Authorizing Provider  acetaminophen (TYLENOL) 500 MG tablet Take 1,000 mg by mouth every 6 (six) hours as needed.  11/04/15   [provider]  albuterol (PROVENTIL) (2.5 MG/3ML) 0.083% nebulizer solution Take 3 mLs (2.5 mg total) by nebulization every 6 (six) hours as needed for wheezing or shortness of breath. 05/28/21   Delia Heady, PA-C  aspirin EC 81 MG tablet Take 81 mg by mouth daily.     [provider]  atorvastatin (LIPITOR) 20 MG tablet Take 20 mg by mouth daily.     [provider]  atorvastatin (LIPITOR) 20 MG tablet Take 1 tablet by mouth at bedtime. 12/21/19   [provider]  calcium carbonate (OS-CAL) 1250 (500 Ca) MG chewable tablet Chew 2 tablets by mouth daily.    [provider]  calcium carbonate (TUMS - DOSED IN MG ELEMENTAL CALCIUM) 500 MG chewable tablet Chew 2 tablets by mouth every morning.     [provider]  cephALEXin (KEFLEX) 500 MG capsule Take 1 capsule (500 mg total) by mouth 2 (two) times daily. 05/11/18   Orlie Dakin, MD  cephALEXin (KEFLEX) 500 MG capsule Take 1 capsule (500 mg total) by mouth 2 (two) times daily. 05/11/18   Orlie Dakin, MD  chlorhexidine (PERIDEX) 0.12 %  solution 15 mLs. 12/02/14   [provider]  Cholecalciferol (VITAMIN D3 SUPER STRENGTH) 2000 units TABS Take 2,000 Units by mouth daily.     [provider]  Cholecalciferol 50 MCG (2000 UT) TABS Take 1 tablet by mouth daily.    [provider]  cyclobenzaprine (FLEXERIL) 10 MG tablet Take 10 mg by mouth daily as needed.  05/23/13   [provider]  diclofenac Sodium (VOLTAREN) 1 % GEL Apply 2 g topically 4 (four) times daily. 03/16/21   Roemhildt, Lorin T, PA-C  diphenhydrAMINE (SOMINEX) 25 MG tablet Take by mouth.    [provider]  escitalopram (LEXAPRO) 10 MG tablet 10 mg daily.  09/09/15   [provider]  escitalopram (LEXAPRO) 20 MG tablet Take 1 tablet by mouth daily. 12/22/19   [provider]  fluticasone (FLONASE) 50 MCG/ACT nasal spray Place 2 sprays into both nostrils daily.  08/12/15   [provider]  furosemide (LASIX) 40 MG tablet Take 40 mg by mouth daily.     [provider]  lidocaine (LIDODERM) 5 % Place onto the skin. 09/01/15   [provider]  lidocaine (LIDODERM) 5 % See admin instructions. 04/18/17   [provider]  lisinopril (PRINIVIL,ZESTRIL) 5 MG tablet Take 5 mg by mouth daily.    [provider]  lisinopril (ZESTRIL) 5 MG tablet Take 1 tablet by mouth daily. 08/25/19   [provider]  metFORMIN (GLUCOPHAGE-XR) 500 MG 24 hr tablet Take 500 mg by mouth daily before supper.    [provider]  methadone (DOLOPHINE) 10 MG tablet Take 5 mg by mouth at bedtime.  10/04/15   [provider]  metoprolol tartrate (LOPRESSOR) 50 MG tablet Take 1 tablet by mouth 3 (three) times daily. 11/23/19   [provider]  mometasone-formoterol (DULERA) 200-5 MCG/ACT AERO Inhale 2 puffs into the lungs 2 (two) times daily as needed.     [provider]  Multiple Vitamin (MULTIVITAMIN) tablet Take by mouth.    [provider]  Omega-3 Fatty  Acids (FISH OIL) 1000 MG CAPS Take 1 capsule by mouth daily.    [provider]  Omega-3 Fatty Acids (OMEGA-3 FISH OIL) 300 MG CAPS Take 1 tablet by mouth at bedtime.    [provider]  ondansetron (ZOFRAN ODT) 4 MG disintegrating tablet Take 1 tablet (4 mg total) by mouth every 8 (eight) hours as needed for nausea or vomiting. 05/21/18   Horton, Barbette Hair, MD  ondansetron (ZOFRAN) 4 MG tablet Take 4 mg by mouth every 6 (six) hours as needed.  12/07/14   [provider]  Oxycodone HCl 20 MG TABS Take 20 mg by mouth every 4 (four) hours as needed.  11/04/15   [provider]  pantoprazole (PROTONIX) 40 MG tablet Take 40 mg by mouth. 12/01/14   [provider]  pantoprazole (PROTONIX) 40 MG tablet Take by mouth. 12/30/19   [provider]  phenylephrine (SUDAFED PE) 10 MG TABS tablet Take by mouth.    [provider]  potassium chloride (K-DUR,KLOR-CON) 10 MEQ tablet 20 mEq 2 (two) times daily.  07/20/15   [provider]  pramipexole (MIRAPEX) 0.125 MG tablet TAKE 1 TO 2 TABLETS BY MOUTH NIGHTLY 01/26/20   [provider]  silver sulfADIAZINE (SILVADENE) 1 % cream Apply 1 application topically 2 (two) times daily as needed (rash).     [provider]  traMADol (ULTRAM) 50 MG tablet Take 1 tablet by mouth every 8 (eight) hours as needed. 02/22/20   [provider]    Physical Exam: Vitals:   05/29/21 1930 05/29/21 1945 05/29/21 2000 05/29/21 2030  BP: 94/71 107/71 114/71 112/63  Pulse: (!) 59 64 66 61  Resp: 17 19 16 18   Temp:      TempSrc:      SpO2: 95% 95% 97% 96%  Weight:      Height:       Constitutional: Elderly woman resting in bed with head elevated.  NAD, calm, comfortable Eyes:  PERRL, lids and conjunctivae normal ENMT: Mucous membranes are dry. Posterior pharynx clear of any exudate or lesions.Normal dentition.  Neck: normal, supple, no masses. Respiratory: clear to auscultation  bilaterally, no wheezing, no crackles. Normal respiratory effort while on 3 L O2 via [place]. No accessory muscle use.  Frequent cough. Cardiovascular: Regular rate and rhythm, no murmurs / rubs / gallops. No extremity edema. 2+ pedal pulses. Abdomen: no tenderness, no masses palpated. No hepatosplenomegaly. Bowel sounds positive.  Musculoskeletal: no clubbing / cyanosis. No joint deformity upper and lower extremities. Good ROM, no contractures. Normal muscle tone.  Skin: no rashes, lesions, ulcers. No induration Neurologic: CN 2-12 grossly intact. Sensation intact. Strength 5/5 in all 4.  Psychiatric: Normal judgment and insight. Alert and oriented x 3. Normal mood.   Labs on Admission: I have personally reviewed following labs and imaging studies  CBC: Recent Labs  Lab 05/28/21 1925 05/29/21 1844  WBC 4.5 6.1  NEUTROABS 2.9 4.5  HGB 12.5 11.1*  HCT 38.2 35.1*  MCV 96.5 98.3  PLT 253 630   Basic Metabolic Panel: Recent Labs  Lab 05/28/21 2103 05/29/21 1844  NA 137 138  K 4.0 4.0  CL 104 104  CO2 25 28  GLUCOSE 113* 122*  BUN 20 17  CREATININE 1.10* 1.10*  CALCIUM 9.2 9.3   GFR: Estimated Creatinine Clearance: 48.2 mL/min (A) (by C-G formula based on SCr of 1.1 mg/dL (H)). Liver Function Tests: No results for input(s): AST, ALT, ALKPHOS, BILITOT, PROT, ALBUMIN in the last 168 hours. No results for input(s): LIPASE, AMYLASE in the last 168 hours. No results for input(s): AMMONIA in the last 168 hours. Coagulation Profile: No results for input(s): INR, PROTIME in the last 168 hours. Cardiac Enzymes: No results for input(s): CKTOTAL, CKMB, CKMBINDEX, TROPONINI in the last 168 hours. BNP (last 3 results) No results for input(s): PROBNP in the last 8760 hours. HbA1C: No results for input(s): HGBA1C in the last 72 hours. CBG: No results for input(s): GLUCAP in the last 168 hours. Lipid Profile: No results for input(s): CHOL, HDL, LDLCALC, TRIG, CHOLHDL, LDLDIRECT in the last  72 hours. Thyroid Function Tests: No results for input(s): TSH, T4TOTAL, FREET4, T3FREE, THYROIDAB in the last 72 hours. Anemia Panel: No results for input(s): VITAMINB12, FOLATE, FERRITIN, TIBC, IRON, RETICCTPCT in the last 72 hours. Urine analysis:    Component Value Date/Time   COLORURINE YELLOW 05/21/2018 0138   APPEARANCEUR CLEAR 05/21/2018 0138   LABSPEC 1.025 05/21/2018 0138   PHURINE 6.0 05/21/2018 0138   GLUCOSEU NEGATIVE 05/21/2018 0138   HGBUR SMALL (A) 05/21/2018 0138   BILIRUBINUR NEGATIVE 05/21/2018 0138   KETONESUR NEGATIVE 05/21/2018 0138   PROTEINUR NEGATIVE 05/21/2018 0138   UROBILINOGEN 0.2 05/23/2013 1819   NITRITE NEGATIVE 05/21/2018 0138   LEUKOCYTESUR TRACE (A) 05/21/2018 0138    Radiological Exams on Admission: DG Chest 2 View  Result Date: 05/28/2021 CLINICAL DATA:  Initial evaluation for acute shortness of breath. EXAM: CHEST - 2 VIEW COMPARISON:  Prior radiograph from 08/04/2020. FINDINGS: Left-sided pacemaker/AICD in place. Mild cardiomegaly, stable. Mediastinal silhouette normal. Aortic atherosclerosis. Lungs normally inflated. No focal infiltrates. No pulmonary edema or pleural effusion. No pneumothorax. Prominent thoracic dextroscoliosis with multilevel degenerative spondylosis. Multiple surgical clips overlie the upper abdomen. IMPRESSION: 1. No active cardiopulmonary disease. 2.  Aortic Atherosclerosis (ICD10-I70.0). Electronically Signed   By: Jeannine Boga M.D.   On: 05/28/2021 20:02   DG Chest Port 1 View  Result Date: 05/29/2021 CLINICAL DATA:  Shortness of  breath EXAM: PORTABLE CHEST 1 VIEW COMPARISON:  05/28/2021 FINDINGS: Left pacer in place with leads in the right atrium and right ventricle, unchanged. Heart is normal size. Bibasilar opacities, new since prior study, favor atelectasis. No effusions or acute bony abnormality. IMPRESSION: Bibasilar opacity, favor atelectasis. Electronically Signed   By: Rolm Baptise M.D.   On: 05/29/2021  19:01    EKG: Personally reviewed. Normal sinus rhythm, early R wave transition.  No acute ischemic changes.  Not significantly changed when compared to prior.  Assessment/Plan Principal Problem:   Influenza A Active Problems:   Hypertension   Diabetes mellitus without complication (HCC)   High cholesterol   Asthma   Acute respiratory failure with hypoxia (HCC)   OSA on CPAP   Saniah L Meunier is a 80 y.o. female with medical history significant for T2DM, HTN, HLD, asthma, CKD stage IIIa, SSS s/p PPM, renal cancer s/p left nephrectomy, and OSA on CPAP who is admitted with influenza A.  Influenza A: Flu A positive 10/31.  Patient with ongoing fevers with T-max 103.2 F.  She is generally weak/deconditioned and not safe to return home at this time. -Started on renal dosed Tamiflu BID -Start gentle IV fluid hydration overnight -Continue supportive care with Tylenol, antiemetics, antitussives -Mobilize with PT  Acute respiratory failure with hypoxia: SPO2 88% on room air on arrival.  Currently requiring 3 L O2 via [place].  CXR shows bibasilar atelectasis. -Continue supplemental oxygen as needed, wean as able -Incentive spirometer, flutter valve, albuterol  Asthma: No active wheezing on admission.  Continue Dulera, albuterol.  Type 2 diabetes: Hold metformin, place on SSI.  Hypertension: Hold antihypertensives as BP is on softer side.  CKD stage IIIa: Chronic and appears relatively stable.  Continue to monitor.  Hyperlipidemia: Continue atorvastatin.  SSS: S/p PPM.  OSA: Continue CPAP nightly.  DVT prophylaxis: Lovenox Code Status: Full code, confirmed with patient on admission Family Communication: Discussed with patient's daughter at bedside Disposition Plan: From home and likely discharge home pending clinical progress Consults called: None Level of care: Med-Surg Admission status:  Status is: Observation  The patient remains OBS appropriate and will d/c before 2  midnights.  Zada Finders MD [facility]  If 7PM-7AM, please contact night-coverage [email or web address]  05/29/2021, 10:38 PM

## 2021-05-29 NOTE — ED Provider Notes (Signed)
[facility] DEPT Provider Note   CSN: [id number] Arrival date & time: 05/29/21  4166     History Chief Complaint  Patient presents with   Shortness of Breath    Cathy Richardson is a 80 y.o. female.  Pt presents to the ED today with sob.  Pt had her flu and Covid booster on Friday, 10/28.  She said she started feeling poorly on 10/30.  Multiple family members have the flu.  She did go to [facility] yesterday for sob.  She was oxygenating well then and CXR was clear, so she was d/c home.  Sob worsened today.  Pt has had a fever all day.  She has not taken anything for it.  She called EMS and RA O2 sat was 88%.  They put her on 4L and gave her nebs and 125 mg solumedrol.  Breathing has improved, but she still feel sob.      Past Medical History:  Diagnosis Date   Anemia    Arthritis    Asthma    Cancer ([place])    History of Renal Cancer   Chronic back pain    Depression    Diabetes mellitus without complication (HCC)    Excessive somnolence disorder    Family history of adverse reaction to anesthesia    son has problems with nausea    GERD (gastroesophageal reflux disease)    Glaucoma    pt denies on preop visit of 01/03/2015    Headache    Heart murmur    High cholesterol    History of hiatal hernia    Hypertension    Osteoporosis    Peptic ulcer    Renal disorder    only one kidney    Sleep apnea    cpap- does not know settings    Urinary tract infection    hx     Patient Active Problem List   Diagnosis Date Noted   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   Hypertension 11/12/2015   GERD (gastroesophageal reflux disease) 11/12/2015   Diabetes mellitus without complication ([place]) 01/27/1600   Vitamin D deficiency 11/12/2015   High cholesterol 11/12/2015   Depression 11/12/2015   Chronic pain syndrome 11/12/2015   Asthma 11/12/2015   Sublingual gland swelling 01/05/2015   Prolapsed sublingual gland 12/30/2014    Past  Surgical History:  Procedure Laterality Date   bowel obstruction surgery      x 2    Carpule Tunnel Release Bilateral    CHOLECYSTECTOMY     JOINT REPLACEMENT     left knee replacement    KNEE ARTHROCENTESIS     left knee   NEPHRECTOMY     PACEMAKER IMPLANT     Removal of Skin Cancer  N/A    Forehead   SKIN SPLIT GRAFT N/A 01/05/2015   Procedure: MANDIBULAR LINGUAL SPLIT THICKNESS SKIN GRAFT VESTIVULOPLASTY;  Surgeon: Michaela Corner, DDS;  Location: [facility] ORS;  Service: Oral Surgery;  Laterality: N/A;     OB History     Gravida  7   Para  7   Term  6   Preterm  1   AB      Living  5      SAB      IAB      Ectopic      Multiple      Live Births              History reviewed. No  pertinent family history.  Social History   Tobacco Use   Smoking status: Never   Smokeless tobacco: Never  Vaping Use   Vaping Use: Never used  Substance Use Topics   Alcohol use: No   Drug use: No    Home Medications Prior to Admission medications   Medication Sig Start Date End Date Taking? Authorizing Provider  acetaminophen (TYLENOL) 500 MG tablet Take 1,000 mg by mouth every 6 (six) hours as needed.  11/04/15   [provider]  albuterol (PROVENTIL) (2.5 MG/3ML) 0.083% nebulizer solution Take 3 mLs (2.5 mg total) by nebulization every 6 (six) hours as needed for wheezing or shortness of breath. 05/28/21   Delia Heady, PA-C  aspirin EC 81 MG tablet Take 81 mg by mouth daily.     [provider]  atorvastatin (LIPITOR) 20 MG tablet Take 20 mg by mouth daily.     [provider]  atorvastatin (LIPITOR) 20 MG tablet Take 1 tablet by mouth at bedtime. 12/21/19   [provider]  calcium carbonate (OS-CAL) 1250 (500 Ca) MG chewable tablet Chew 2 tablets by mouth daily.    [provider]  calcium carbonate (TUMS - DOSED IN MG ELEMENTAL CALCIUM) 500 MG chewable tablet Chew 2 tablets by mouth every morning.     [provider]   cephALEXin (KEFLEX) 500 MG capsule Take 1 capsule (500 mg total) by mouth 2 (two) times daily. 05/11/18   Orlie Dakin, MD  cephALEXin (KEFLEX) 500 MG capsule Take 1 capsule (500 mg total) by mouth 2 (two) times daily. 05/11/18   Orlie Dakin, MD  chlorhexidine (PERIDEX) 0.12 % solution 15 mLs. 12/02/14   [provider]  Cholecalciferol (VITAMIN D3 SUPER STRENGTH) 2000 units TABS Take 2,000 Units by mouth daily.     [provider]  Cholecalciferol 50 MCG (2000 UT) TABS Take 1 tablet by mouth daily.    [provider]  cyclobenzaprine (FLEXERIL) 10 MG tablet Take 10 mg by mouth daily as needed.  05/23/13   [provider]  diclofenac Sodium (VOLTAREN) 1 % GEL Apply 2 g topically 4 (four) times daily. 03/16/21   Roemhildt, Lorin T, PA-C  diphenhydrAMINE (SOMINEX) 25 MG tablet Take by mouth.    [provider]  escitalopram (LEXAPRO) 10 MG tablet 10 mg daily.  09/09/15   [provider]  escitalopram (LEXAPRO) 20 MG tablet Take 1 tablet by mouth daily. 12/22/19   [provider]  fluticasone (FLONASE) 50 MCG/ACT nasal spray Place 2 sprays into both nostrils daily.  08/12/15   [provider]  furosemide (LASIX) 40 MG tablet Take 40 mg by mouth daily.     [provider]  lidocaine (LIDODERM) 5 % Place onto the skin. 09/01/15   [provider]  lidocaine (LIDODERM) 5 % See admin instructions. 04/18/17   [provider]  lisinopril (PRINIVIL,ZESTRIL) 5 MG tablet Take 5 mg by mouth daily.    [provider]  lisinopril (ZESTRIL) 5 MG tablet Take 1 tablet by mouth daily. 08/25/19   [provider]  metFORMIN (GLUCOPHAGE-XR) 500 MG 24 hr tablet Take 500 mg by mouth daily before supper.    [provider]  methadone (DOLOPHINE) 10 MG tablet Take 5 mg by mouth at bedtime.  10/04/15   [provider]  metoprolol tartrate (LOPRESSOR) 50 MG tablet Take 1 tablet by mouth 3 (three)  times daily. 11/23/19   [provider]  mometasone-formoterol (DULERA) 200-5 MCG/ACT AERO  Inhale 2 puffs into the lungs 2 (two) times daily as needed.     [provider]  Multiple Vitamin (MULTIVITAMIN) tablet Take by mouth.    [provider]  Omega-3 Fatty Acids (FISH OIL) 1000 MG CAPS Take 1 capsule by mouth daily.    [provider]  Omega-3 Fatty Acids (OMEGA-3 FISH OIL) 300 MG CAPS Take 1 tablet by mouth at bedtime.    [provider]  ondansetron (ZOFRAN ODT) 4 MG disintegrating tablet Take 1 tablet (4 mg total) by mouth every 8 (eight) hours as needed for nausea or vomiting. 05/21/18   Horton, Barbette Hair, MD  ondansetron (ZOFRAN) 4 MG tablet Take 4 mg by mouth every 6 (six) hours as needed.  12/07/14   [provider]  Oxycodone HCl 20 MG TABS Take 20 mg by mouth every 4 (four) hours as needed.  11/04/15   [provider]  pantoprazole (PROTONIX) 40 MG tablet Take 40 mg by mouth. 12/01/14   [provider]  pantoprazole (PROTONIX) 40 MG tablet Take by mouth. 12/30/19   [provider]  phenylephrine (SUDAFED PE) 10 MG TABS tablet Take by mouth.    [provider]  potassium chloride (K-DUR,KLOR-CON) 10 MEQ tablet 20 mEq 2 (two) times daily.  07/20/15   [provider]  pramipexole (MIRAPEX) 0.125 MG tablet TAKE 1 TO 2 TABLETS BY MOUTH NIGHTLY 01/26/20   [provider]  silver sulfADIAZINE (SILVADENE) 1 % cream Apply 1 application topically 2 (two) times daily as needed (rash).     [provider]  traMADol (ULTRAM) 50 MG tablet Take 1 tablet by mouth every 8 (eight) hours as needed. 02/22/20   [provider]    Allergies    Gabapentin, Fluoxetine, Iodine-131, Naproxen, Reglan [metoclopramide], Tizanidine, and Ivp dye [iodinated diagnostic agents]  Review of Systems   Review of Systems  Constitutional:  Positive for fever.  Respiratory:  Positive for cough and  shortness of breath.   All other systems reviewed and are negative.  Physical Exam Updated Vital Signs BP 114/71   Pulse 66   Temp (!) 103.2 F (39.6 C) (Oral)   Resp 16   Ht 5\' 4"  (1.626 m)   Wt 102 kg   SpO2 97%   BMI 38.60 kg/m   Physical Exam Vitals and nursing note reviewed.  Constitutional:      Appearance: She is well-developed. She is obese. She is ill-appearing.  HENT:     Head: Normocephalic and atraumatic.     Mouth/Throat:     Mouth: Mucous membranes are moist.     Pharynx: Oropharynx is clear.  Eyes:     Extraocular Movements: Extraocular movements intact.     Pupils: Pupils are equal, round, and reactive to light.  Cardiovascular:     Rate and Rhythm: Normal rate and regular rhythm.  Pulmonary:     Effort: Pulmonary effort is normal.     Breath sounds: Wheezing present.  Abdominal:     General: Bowel sounds are normal.     Palpations: Abdomen is soft.  Musculoskeletal:        General: Normal range of motion.     Cervical back: Normal range of motion and neck supple.  Skin:    General: Skin is warm.     Capillary Refill: Capillary refill takes less than 2 seconds.  Neurological:     General: No focal deficit present.     Mental Status: She is alert and  oriented to person, place, and time.  Psychiatric:        Mood and Affect: Mood normal.        Behavior: Behavior normal.    ED Results / Procedures / Treatments   Labs (all labs ordered are listed, but only abnormal results are displayed) Labs Reviewed  RESP PANEL BY RT-PCR (FLU A&B, COVID) ARPGX2 - Abnormal; Notable for the following components:      Result Value   Influenza A by PCR POSITIVE (*)    All other components within normal limits  BASIC METABOLIC PANEL - Abnormal; Notable for the following components:   Glucose, Bld 122 (*)    Creatinine, Ser 1.10 (*)    GFR, Estimated 51 (*)    All other components within normal limits  CBC WITH DIFFERENTIAL/PLATELET - Abnormal; Notable for the  following components:   RBC 3.57 (*)    Hemoglobin 11.1 (*)    HCT 35.1 (*)    All other components within normal limits  CULTURE, BLOOD (ROUTINE X 2)  CULTURE, BLOOD (ROUTINE X 2)  BRAIN NATRIURETIC PEPTIDE  LACTIC ACID, PLASMA  LACTIC ACID, PLASMA    EKG EKG Interpretation  Date/Time:  Monday May 29 2021 18:41:36 EDT Ventricular Rate:  64 PR Interval:  152 QRS Duration: 98 QT Interval:  399 QTC Calculation: 412 R Axis:   8 Text Interpretation: Sinus rhythm Abnormal R-wave progression, early transition Left ventricular hypertrophy No significant change since last tracing Confirmed by Isla Pence [phone]) on 05/29/2021 7:02:50 PM  Radiology DG Chest 2 View  Result Date: 05/28/2021 CLINICAL DATA:  Initial evaluation for acute shortness of breath. EXAM: CHEST - 2 VIEW COMPARISON:  Prior radiograph from 08/04/2020. FINDINGS: Left-sided pacemaker/AICD in place. Mild cardiomegaly, stable. Mediastinal silhouette normal. Aortic atherosclerosis. Lungs normally inflated. No focal infiltrates. No pulmonary edema or pleural effusion. No pneumothorax. Prominent thoracic dextroscoliosis with multilevel degenerative spondylosis. Multiple surgical clips overlie the upper abdomen. IMPRESSION: 1. No active cardiopulmonary disease. 2.  Aortic Atherosclerosis (ICD10-I70.0). Electronically Signed   By: Jeannine Boga M.D.   On: 05/28/2021 20:02   DG Chest Port 1 View  Result Date: 05/29/2021 CLINICAL DATA:  Shortness of breath EXAM: PORTABLE CHEST 1 VIEW COMPARISON:  05/28/2021 FINDINGS: Left pacer in place with leads in the right atrium and right ventricle, unchanged. Heart is normal size. Bibasilar opacities, new since prior study, favor atelectasis. No effusions or acute bony abnormality. IMPRESSION: Bibasilar opacity, favor atelectasis. Electronically Signed   By: Rolm Baptise M.D.   On: 05/29/2021 19:01    Procedures Procedures   Medications Ordered in ED Medications   acetaminophen (TYLENOL) tablet 1,000 mg (has no administration in time range)  albuterol (PROVENTIL) (2.5 MG/3ML) 0.083% nebulizer solution 2.5 mg (has no administration in time range)  oseltamivir (TAMIFLU) capsule 75 mg (has no administration in time range)    ED Course  I have reviewed the triage vital signs and the nursing notes.  Pertinent labs & imaging results that were available during my care of the patient were reviewed by me and considered in my medical decision making (see chart for details).    MDM Rules/Calculators/A&P                           Pt is positive for influenza A.  She is hypoxic and requiring 3 L oxygen via [place].  She is given additional nebs.  Tamiflu ordered.  Pt is d/w Dr. Posey Pronto (triad)  for admission.  CRITICAL CARE Performed by: Isla Pence   Total critical care time: 30 minutes  Critical care time was exclusive of separately billable procedures and treating other patients.  Critical care was necessary to treat or prevent imminent or life-threatening deterioration.  Critical care was time spent personally by me on the following activities: development of treatment plan with patient and/or surrogate as well as nursing, discussions with consultants, evaluation of patient's response to treatment, examination of patient, obtaining history from patient or surrogate, ordering and performing treatments and interventions, ordering and review of laboratory studies, ordering and review of radiographic studies, pulse oximetry and re-evaluation of patient's condition.   Icis L Harkey was evaluated in Emergency Department on 05/29/2021 for the symptoms described in the history of present illness. She was evaluated in the context of the global COVID-19 pandemic, which necessitated consideration that the patient might be at risk for infection with the SARS-CoV-2 virus that causes COVID-19. Institutional protocols and algorithms that pertain to the evaluation of  patients at risk for COVID-19 are in a state of rapid change based on information released by regulatory bodies including the CDC and federal and state organizations. These policies and algorithms were followed during the patient's care in the ED.  Final Clinical Impression(s) / ED Diagnoses Final diagnoses:  Influenza A  Acute respiratory failure with hypoxia [facility])    Rx / DC Orders ED Discharge Orders     None        Isla Pence, MD 05/29/21 2026

## 2021-05-29 NOTE — ED Triage Notes (Signed)
Pt BIB [facility] from home. Pt c/o shortness of breath beginning Sunday. Pt family noticed increased work of breathing today along with AMS and EMS was called. Upon arrival, pt had audible wheezes with SpO2 of 88% on RA. EMS gave two 5mg  albuterol treatments and 125 of solumedrol. Pt was placed on 4 L nasula cannula. Pt has a hx of diabetes, hypertension, asthma, and has a pacemaker.

## 2021-05-30 DIAGNOSIS — I959 Hypotension, unspecified: Secondary | ICD-10-CM | POA: Diagnosis not present

## 2021-05-30 DIAGNOSIS — J45901 Unspecified asthma with (acute) exacerbation: Secondary | ICD-10-CM | POA: Diagnosis present

## 2021-05-30 DIAGNOSIS — E1122 Type 2 diabetes mellitus with diabetic chronic kidney disease: Secondary | ICD-10-CM | POA: Diagnosis present

## 2021-05-30 DIAGNOSIS — Z85038 Personal history of other malignant neoplasm of large intestine: Secondary | ICD-10-CM | POA: Diagnosis not present

## 2021-05-30 DIAGNOSIS — M810 Age-related osteoporosis without current pathological fracture: Secondary | ICD-10-CM | POA: Diagnosis present

## 2021-05-30 DIAGNOSIS — J4521 Mild intermittent asthma with (acute) exacerbation: Secondary | ICD-10-CM | POA: Diagnosis not present

## 2021-05-30 DIAGNOSIS — Z823 Family history of stroke: Secondary | ICD-10-CM | POA: Diagnosis not present

## 2021-05-30 DIAGNOSIS — G4733 Obstructive sleep apnea (adult) (pediatric): Secondary | ICD-10-CM | POA: Diagnosis present

## 2021-05-30 DIAGNOSIS — Z6838 Body mass index (BMI) 38.0-38.9, adult: Secondary | ICD-10-CM | POA: Diagnosis not present

## 2021-05-30 DIAGNOSIS — J9601 Acute respiratory failure with hypoxia: Secondary | ICD-10-CM | POA: Diagnosis present

## 2021-05-30 DIAGNOSIS — Z905 Acquired absence of kidney: Secondary | ICD-10-CM | POA: Diagnosis not present

## 2021-05-30 DIAGNOSIS — Z833 Family history of diabetes mellitus: Secondary | ICD-10-CM | POA: Diagnosis not present

## 2021-05-30 DIAGNOSIS — N1831 Chronic kidney disease, stage 3a: Secondary | ICD-10-CM | POA: Diagnosis present

## 2021-05-30 DIAGNOSIS — Z950 Presence of cardiac pacemaker: Secondary | ICD-10-CM | POA: Diagnosis not present

## 2021-05-30 DIAGNOSIS — Z8249 Family history of ischemic heart disease and other diseases of the circulatory system: Secondary | ICD-10-CM | POA: Diagnosis not present

## 2021-05-30 DIAGNOSIS — J101 Influenza due to other identified influenza virus with other respiratory manifestations: Secondary | ICD-10-CM | POA: Diagnosis present

## 2021-05-30 DIAGNOSIS — I129 Hypertensive chronic kidney disease with stage 1 through stage 4 chronic kidney disease, or unspecified chronic kidney disease: Secondary | ICD-10-CM | POA: Diagnosis present

## 2021-05-30 DIAGNOSIS — J9811 Atelectasis: Secondary | ICD-10-CM | POA: Diagnosis present

## 2021-05-30 DIAGNOSIS — Z8711 Personal history of peptic ulcer disease: Secondary | ICD-10-CM | POA: Diagnosis not present

## 2021-05-30 DIAGNOSIS — E7800 Pure hypercholesterolemia, unspecified: Secondary | ICD-10-CM | POA: Diagnosis present

## 2021-05-30 DIAGNOSIS — N179 Acute kidney failure, unspecified: Secondary | ICD-10-CM | POA: Diagnosis not present

## 2021-05-30 DIAGNOSIS — Z85528 Personal history of other malignant neoplasm of kidney: Secondary | ICD-10-CM | POA: Diagnosis not present

## 2021-05-30 DIAGNOSIS — I495 Sick sinus syndrome: Secondary | ICD-10-CM | POA: Diagnosis present

## 2021-05-30 DIAGNOSIS — Z20822 Contact with and (suspected) exposure to covid-19: Secondary | ICD-10-CM | POA: Diagnosis present

## 2021-05-30 LAB — BASIC METABOLIC PANEL
Anion gap: 8 (ref 5–15)
BUN: 23 mg/dL (ref 8–23)
CO2: 24 mmol/L (ref 22–32)
Calcium: 8.9 mg/dL (ref 8.9–10.3)
Chloride: 104 mmol/L (ref 98–111)
Creatinine, Ser: 1.5 mg/dL — ABNORMAL HIGH (ref 0.44–1.00)
GFR, Estimated: 35 mL/min — ABNORMAL LOW (ref >60)
Glucose, Bld: 176 mg/dL — ABNORMAL HIGH (ref 70–99)
Potassium: 4.1 mmol/L (ref 3.5–5.1)
Sodium: 136 mmol/L (ref 135–145)

## 2021-05-30 LAB — CBC
HCT: 35.3 % — ABNORMAL LOW (ref 36.0–46.0)
Hemoglobin: 11 g/dL — ABNORMAL LOW (ref 12.0–15.0)
MCH: 31.2 pg (ref 26.0–34.0)
MCHC: 31.2 g/dL (ref 30.0–36.0)
MCV: 100 fL (ref 80.0–100.0)
Platelets: 201 10*3/uL (ref 150–400)
RBC: 3.53 MIL/uL — ABNORMAL LOW (ref 3.87–5.11)
RDW: 13.5 % (ref 11.5–15.5)
WBC: 4.9 10*3/uL (ref 4.0–10.5)
nRBC: 0 % (ref 0.0–0.2)

## 2021-05-30 LAB — GLUCOSE, CAPILLARY
Glucose-Capillary: 164 mg/dL — ABNORMAL HIGH (ref 70–99)
Glucose-Capillary: 157 mg/dL — ABNORMAL HIGH (ref 70–99)

## 2021-05-30 LAB — CBG MONITORING, ED
Glucose-Capillary: 173 mg/dL — ABNORMAL HIGH (ref 70–99)
Glucose-Capillary: 161 mg/dL — ABNORMAL HIGH (ref 70–99)

## 2021-05-30 MED ORDER — HYDROCOD POLST-CPM POLST ER 10-8 MG/5ML PO SUER
5.0000 mL | Freq: Two times a day (BID) | ORAL | Status: DC | PRN
  Administered 2021-05-30: 5 mL via ORAL
  Filled 2021-05-30: qty 5

## 2021-05-30 MED ORDER — HYDROCOD POLST-CPM POLST ER 10-8 MG/5ML PO SUER: 5.0000 mL | Freq: Two times a day (BID) | ORAL | Status: DC

## 2021-05-30 MED ORDER — PREGABALIN 25 MG PO CAPS
25.0000 mg | ORAL_CAPSULE | Freq: Every day | ORAL | Status: DC
  Administered 2021-05-30 – 2021-06-02 (×4): 25 mg via ORAL
  Filled 2021-05-30 (×4): qty 1

## 2021-05-30 MED ORDER — DEXTROMETHORPHAN POLISTIREX ER 30 MG/5ML PO SUER
30.0000 mg | Freq: Two times a day (BID) | ORAL | Status: DC
  Administered 2021-05-30 – 2021-06-02 (×6): 30 mg via ORAL
  Filled 2021-05-30 (×8): qty 5

## 2021-05-30 MED ORDER — ESCITALOPRAM OXALATE 10 MG PO TABS
20.0000 mg | ORAL_TABLET | Freq: Every day | ORAL | Status: DC
  Administered 2021-05-30 – 2021-06-02 (×4): 20 mg via ORAL
  Filled 2021-05-30 (×4): qty 2

## 2021-05-30 MED ORDER — PRAMIPEXOLE DIHYDROCHLORIDE 0.125 MG PO TABS
0.1250 mg | ORAL_TABLET | Freq: Every day | ORAL | Status: DC
  Administered 2021-05-30 – 2021-06-01 (×3): 0.25 mg via ORAL
  Filled 2021-05-30 (×4): qty 2

## 2021-05-30 MED ORDER — PHENOL 1.4 % MT LIQD
1.0000 | OROMUCOSAL | Status: DC | PRN
  Administered 2021-05-31: 1 via OROMUCOSAL
  Filled 2021-05-30 (×2): qty 177

## 2021-05-30 NOTE — Progress Notes (Signed)
PROGRESS NOTE    Cathy Richardson   PXT:[id number]  DOB: 01-17-1941  DOA: 05/29/2021 PCP: Cena Benton, MD   Brief Narrative:  Cathy Richardson a 80 y.o. female with medical history significant for T2DM, HTN, HLD, asthma, CKD stage IIIa, SSS s/p PPM, renal cancer s/p left nephrectomy, and OSA on CPAP who presents to the ED for evaluation of fevers and shortness of breath.  Patient states she received both the COVID-19 booster and influenza vaccine about 3 days ago.  She says that she has had multiple children in her household recently tested positive for flu.   She has been admitted for Influenza A, cough, dyspnea, hypoxia and fever for 103.2.    Subjective: Feels very weak today.     Assessment & Plan:   Principal Problem:   Influenza A, Acute resp failure with hypoxia- Underlying asthma - cont Tamiflu, oxygen (on 3 L) antipyretics and antitussives   Active Problems:   Mild AKI - Cr now 1.50 today- baseline appears to 1.10- follow - she has received IVF and is drinking fluids well today  Hypotension with h/o Hypertension - hold Labetalol and Lisinopril    Diabetes mellitus without complication ([place]) - hold Metformin- cont SSI     Time spent in minutes: 35 DVT prophylaxis: enoxaparin (LOVENOX) injection 40 mg Start: 05/29/21 2200 Code Status: Full code Family Communication:  Level of Care: Level of care: Med-Surg Disposition Plan:  Status is: Inpatient  Remains inpatient appropriate because: of hypoxia, Hypotension and AKI   Consultants:   Procedures:   Antimicrobials:  Anti-infectives (From admission, onward)    Start     Dose/Rate Route Frequency Ordered Stop   05/29/21 2046  oseltamivir (TAMIFLU) capsule 30 mg        30 mg Oral 2 times daily 05/29/21 2030 06/03/21 2159   05/29/21 2015  oseltamivir (TAMIFLU) capsule 75 mg  Status:  Discontinued        75 mg Oral  Once 05/29/21 2006 05/29/21 2030        Objective: Vitals:   05/30/21 0845  05/30/21 0900 05/30/21 1200 05/30/21 1300  BP:  108/68 (!) 103/53 (!) 109/58  Pulse: 66 65 70 64  Resp: 17 18 18 11   Temp:    98.3 F (36.8 C)  TempSrc:    Oral  SpO2: 99% 99% 100% 100%  Weight:      Height:       No intake or output data in the 24 hours ending 05/30/21 1443 Filed Weights   05/29/21 1845  Weight: 102 kg    Examination: General exam: Appears comfortable  HEENT: PERRLA, oral mucosa moist, no sclera icterus or thrush Respiratory system: b/l rhonchi Cardiovascular system: S1 & S2 heard, RRR.   Gastrointestinal system: Abdomen soft, non-tender, nondistended. Normal bowel sounds. Central nervous system: Alert and oriented. No focal neurological deficits. Extremities: No cyanosis, clubbing or edema Skin: No rashes or ulcers Psychiatry:  Mood & affect appropriate.     Data Reviewed: I have personally reviewed following labs and imaging studies  CBC: Recent Labs  Lab 05/28/21 1925 05/29/21 1844 05/30/21 0559  WBC 4.5 6.1 4.9  NEUTROABS 2.9 4.5  --   HGB 12.5 11.1* 11.0*  HCT 38.2 35.1* 35.3*  MCV 96.5 98.3 100.0  PLT 253 206 627   Basic Metabolic Panel: Recent Labs  Lab 05/28/21 2103 05/29/21 1844 05/30/21 0559  NA 137 138 136  K 4.0 4.0 4.1  CL 104 104 104  CO2 25 28 24   GLUCOSE 113* 122* 176*  BUN 20 17 23   CREATININE 1.10* 1.10* 1.50*  CALCIUM 9.2 9.3 8.9   GFR: Estimated Creatinine Clearance: 35.3 mL/min (A) (by C-G formula based on SCr of 1.5 mg/dL (H)). Liver Function Tests: No results for input(s): AST, ALT, ALKPHOS, BILITOT, PROT, ALBUMIN in the last 168 hours. No results for input(s): LIPASE, AMYLASE in the last 168 hours. No results for input(s): AMMONIA in the last 168 hours. Coagulation Profile: No results for input(s): INR, PROTIME in the last 168 hours. Cardiac Enzymes: No results for input(s): CKTOTAL, CKMB, CKMBINDEX, TROPONINI in the last 168 hours. BNP (last 3 results) No results for input(s): PROBNP in the last 8760  hours. HbA1C: No results for input(s): HGBA1C in the last 72 hours. CBG: Recent Labs  Lab 05/30/21 0819 05/30/21 1158  GLUCAP 173* 161*   Lipid Profile: No results for input(s): CHOL, HDL, LDLCALC, TRIG, CHOLHDL, LDLDIRECT in the last 72 hours. Thyroid Function Tests: No results for input(s): TSH, T4TOTAL, FREET4, T3FREE, THYROIDAB in the last 72 hours. Anemia Panel: No results for input(s): VITAMINB12, FOLATE, FERRITIN, TIBC, IRON, RETICCTPCT in the last 72 hours. Urine analysis:    Component Value Date/Time   COLORURINE YELLOW 05/21/2018 0138   APPEARANCEUR CLEAR 05/21/2018 0138   LABSPEC 1.025 05/21/2018 0138   PHURINE 6.0 05/21/2018 0138   GLUCOSEU NEGATIVE 05/21/2018 0138   HGBUR SMALL (A) 05/21/2018 0138   BILIRUBINUR NEGATIVE 05/21/2018 0138   KETONESUR NEGATIVE 05/21/2018 0138   PROTEINUR NEGATIVE 05/21/2018 0138   UROBILINOGEN 0.2 05/23/2013 1819   NITRITE NEGATIVE 05/21/2018 0138   LEUKOCYTESUR TRACE (A) 05/21/2018 0138   Sepsis Labs: @LABRCNTIP (procalcitonin:4,lacticidven:4) ) Recent Results (from the past 240 hour(s))  Resp Panel by RT-PCR (Flu A&B, Covid) Nasopharyngeal Swab     Status: None   Collection Time: 05/28/21  7:25 PM   Specimen: Nasopharyngeal Swab; Nasopharyngeal(NP) swabs in vial transport medium  Result Value Ref Range Status   SARS Coronavirus 2 by RT PCR NEGATIVE NEGATIVE Final    Comment: (NOTE) SARS-CoV-2 target nucleic acids are NOT DETECTED.  The SARS-CoV-2 RNA is generally detectable in upper respiratory specimens during the acute phase of infection. The lowest concentration of SARS-CoV-2 viral copies this assay can detect is 138 copies/mL. A negative result does not preclude SARS-Cov-2 infection and should not be used as the sole basis for treatment or other patient management decisions. A negative result may occur with  improper specimen collection/handling, submission of specimen other than nasopharyngeal swab, presence of viral  mutation(s) within the areas targeted by this assay, and inadequate number of viral copies(<138 copies/mL). A negative result must be combined with clinical observations, patient history, and epidemiological information. The expected result is Negative.  Fact Sheet for Patients:  EntrepreneurPulse.com.au  Fact Sheet for Healthcare Providers:  IncredibleEmployment.be  This test is no t yet approved or cleared by the [place] FDA and  has been authorized for detection and/or diagnosis of SARS-CoV-2 by FDA under an Emergency Use Authorization (EUA). This EUA will remain  in effect (meaning this test can be used) for the duration of the COVID-19 declaration under Section 564(b)(1) of the Act, 21 U.S.C.section 360bbb-3(b)(1), unless the authorization is terminated  or revoked sooner.       Influenza A by PCR NEGATIVE NEGATIVE Final   Influenza B by PCR NEGATIVE NEGATIVE Final    Comment: (NOTE) The Xpert Xpress SARS-CoV-2/FLU/RSV plus assay is intended as an aid in the diagnosis of  influenza from Nasopharyngeal swab specimens and should not be used as a sole basis for treatment. Nasal washings and aspirates are unacceptable for Xpert Xpress SARS-CoV-2/FLU/RSV testing.  Fact Sheet for Patients: EntrepreneurPulse.com.au  Fact Sheet for Healthcare Providers: IncredibleEmployment.be  This test is not yet approved or cleared by the [place] FDA and has been authorized for detection and/or diagnosis of SARS-CoV-2 by FDA under an Emergency Use Authorization (EUA). This EUA will remain in effect (meaning this test can be used) for the duration of the COVID-19 declaration under Section 564(b)(1) of the Act, 21 U.S.C. section 360bbb-3(b)(1), unless the authorization is terminated or revoked.  Performed at [facility], [place] [address] [place], [place] [address]   Resp Panel by RT-PCR (Flu A&B,  Covid) Nasopharyngeal Swab     Status: Abnormal   Collection Time: 05/29/21  6:48 PM   Specimen: Nasopharyngeal Swab; Nasopharyngeal(NP) swabs in vial transport medium  Result Value Ref Range Status   SARS Coronavirus 2 by RT PCR NEGATIVE NEGATIVE Final    Comment: (NOTE) SARS-CoV-2 target nucleic acids are NOT DETECTED.  The SARS-CoV-2 RNA is generally detectable in upper respiratory specimens during the acute phase of infection. The lowest concentration of SARS-CoV-2 viral copies this assay can detect is 138 copies/mL. A negative result does not preclude SARS-Cov-2 infection and should not be used as the sole basis for treatment or other patient management decisions. A negative result may occur with  improper specimen collection/handling, submission of specimen other than nasopharyngeal swab, presence of viral mutation(s) within the areas targeted by this assay, and inadequate number of viral copies(<138 copies/mL). A negative result must be combined with clinical observations, patient history, and epidemiological information. The expected result is Negative.  Fact Sheet for Patients:  EntrepreneurPulse.com.au  Fact Sheet for Healthcare Providers:  IncredibleEmployment.be  This test is no t yet approved or cleared by the [place] FDA and  has been authorized for detection and/or diagnosis of SARS-CoV-2 by FDA under an Emergency Use Authorization (EUA). This EUA will remain  in effect (meaning this test can be used) for the duration of the COVID-19 declaration under Section 564(b)(1) of the Act, 21 U.S.C.section 360bbb-3(b)(1), unless the authorization is terminated  or revoked sooner.       Influenza A by PCR POSITIVE (A) NEGATIVE Final   Influenza B by PCR NEGATIVE NEGATIVE Final    Comment: (NOTE) The Xpert Xpress SARS-CoV-2/FLU/RSV plus assay is intended as an aid in the diagnosis of influenza from Nasopharyngeal swab specimens  and should not be used as a sole basis for treatment. Nasal washings and aspirates are unacceptable for Xpert Xpress SARS-CoV-2/FLU/RSV testing.  Fact Sheet for Patients: EntrepreneurPulse.com.au  Fact Sheet for Healthcare Providers: IncredibleEmployment.be  This test is not yet approved or cleared by the [place] FDA and has been authorized for detection and/or diagnosis of SARS-CoV-2 by FDA under an Emergency Use Authorization (EUA). This EUA will remain in effect (meaning this test can be used) for the duration of the COVID-19 declaration under Section 564(b)(1) of the Act, 21 U.S.C. section 360bbb-3(b)(1), unless the authorization is terminated or revoked.  Performed at [facility], [place] [address] [place], [place]  [address]          Radiology Studies: DG Chest 2 View  Result Date: 05/28/2021 CLINICAL DATA:  Initial evaluation for acute shortness of breath. EXAM: CHEST - 2 VIEW COMPARISON:  Prior radiograph from 08/04/2020. FINDINGS: Left-sided pacemaker/AICD in place. Mild cardiomegaly, stable. Mediastinal silhouette  normal. Aortic atherosclerosis. Lungs normally inflated. No focal infiltrates. No pulmonary edema or pleural effusion. No pneumothorax. Prominent thoracic dextroscoliosis with multilevel degenerative spondylosis. Multiple surgical clips overlie the upper abdomen. IMPRESSION: 1. No active cardiopulmonary disease. 2.  Aortic Atherosclerosis (ICD10-I70.0). Electronically Signed   By: Jeannine Boga M.D.   On: 05/28/2021 20:02   DG Chest Port 1 View  Result Date: 05/29/2021 CLINICAL DATA:  Shortness of breath EXAM: PORTABLE CHEST 1 VIEW COMPARISON:  05/28/2021 FINDINGS: Left pacer in place with leads in the right atrium and right ventricle, unchanged. Heart is normal size. Bibasilar opacities, new since prior study, favor atelectasis. No effusions or acute bony abnormality. IMPRESSION: Bibasilar  opacity, favor atelectasis. Electronically Signed   By: Rolm Baptise M.D.   On: 05/29/2021 19:01      Scheduled Meds:  aspirin EC  81 mg Oral Daily   atorvastatin  20 mg Oral QHS   enoxaparin (LOVENOX) injection  40 mg Subcutaneous Q24H   guaiFENesin  600 mg Oral BID   insulin aspart  0-9 Units Subcutaneous TID WC   mometasone-formoterol  2 puff Inhalation BID   oseltamivir  30 mg Oral BID   Continuous Infusions:   LOS: 0 days      Debbe Odea, MD [facility] Pager: [email or web address] 05/30/2021, 2:43 PM

## 2021-05-30 NOTE — ED Notes (Signed)
No second lactic as pt original lactic was WNL

## 2021-05-30 NOTE — ED Notes (Signed)
Pt requests to be switched to [place] instead of the cpap she was on during the night, pt got up and was able to void in [facility], new coke given and pt positioned for comfort.

## 2021-05-30 NOTE — ED Notes (Signed)
Pt given breakfast tray

## 2021-05-31 DIAGNOSIS — J9601 Acute respiratory failure with hypoxia: Secondary | ICD-10-CM

## 2021-05-31 DIAGNOSIS — J45901 Unspecified asthma with (acute) exacerbation: Secondary | ICD-10-CM

## 2021-05-31 LAB — GLUCOSE, CAPILLARY
Glucose-Capillary: 115 mg/dL — ABNORMAL HIGH (ref 70–99)
Glucose-Capillary: 130 mg/dL — ABNORMAL HIGH (ref 70–99)
Glucose-Capillary: 197 mg/dL — ABNORMAL HIGH (ref 70–99)
Glucose-Capillary: 250 mg/dL — ABNORMAL HIGH (ref 70–99)

## 2021-05-31 LAB — BASIC METABOLIC PANEL
Anion gap: 8 (ref 5–15)
BUN: 30 mg/dL — ABNORMAL HIGH (ref 8–23)
CO2: 23 mmol/L (ref 22–32)
Calcium: 8.5 mg/dL — ABNORMAL LOW (ref 8.9–10.3)
Chloride: 106 mmol/L (ref 98–111)
Creatinine, Ser: 1.42 mg/dL — ABNORMAL HIGH (ref 0.44–1.00)
GFR, Estimated: 38 mL/min — ABNORMAL LOW (ref >60)
Glucose, Bld: 184 mg/dL — ABNORMAL HIGH (ref 70–99)
Potassium: 3.8 mmol/L (ref 3.5–5.1)
Sodium: 137 mmol/L (ref 135–145)

## 2021-05-31 MED ORDER — ALBUTEROL SULFATE (2.5 MG/3ML) 0.083% IN NEBU
2.5000 mg | INHALATION_SOLUTION | Freq: Four times a day (QID) | RESPIRATORY_TRACT | Status: DC
  Administered 2021-05-31 – 2021-06-02 (×7): 2.5 mg via RESPIRATORY_TRACT
  Filled 2021-05-31 (×7): qty 3

## 2021-05-31 MED ORDER — PREDNISONE 20 MG PO TABS
40.0000 mg | ORAL_TABLET | Freq: Once | ORAL | Status: AC
  Administered 2021-05-31: 40 mg via ORAL
  Filled 2021-05-31: qty 2

## 2021-05-31 MED ORDER — FENTANYL 25 MCG/HR TD PT72
1.0000 | MEDICATED_PATCH | TRANSDERMAL | Status: DC
  Administered 2021-05-31: 1 via TRANSDERMAL
  Filled 2021-05-31: qty 1

## 2021-05-31 NOTE — Progress Notes (Signed)
   05/31/21 1300  Mobility  Activity Ambulated in room;Ambulated to bathroom  Assistive Device None  Distance Ambulated (ft) 45 ft  Mobility Out of bed for toileting;Ambulated with assistance in room  Mobility Response Tolerated well  Mobility performed by Mobility specialist  $Mobility charge 1 Mobility   Pt agreeable to mobilizing this afternoon. RN asked if pt could be mobilized without O2 during the session. Pt ambulated from the chair to the bathroom, then back to the bed. Had a couple coughing fits, which she stated were painful. Otherwise no complaints. RN notified of session and O2 sat.  Mobility Specialist - Progress Note **No O2** Pre-mobility: HR 104, SpO2 90% During mobility: HR 98-115, SpO2 90-93% Post-mobility: HR 98, SPO2 95%   [place] Office: [phone]

## 2021-05-31 NOTE — Progress Notes (Signed)
Pt declined cpap at this time.  Pt encouraged to call when she is ready.  RN aware.

## 2021-05-31 NOTE — Progress Notes (Signed)
PROGRESS NOTE    Cathy Richardson  PHX:[id number] DOB: Aug 20, 1940 DOA: 05/29/2021 PCP: Cena Benton, MD   Brief Narrative: Cathy Richardson is a 80 y.o. female with a history of diabetes mellitus type 2, hypertension, hyperlipidemia, asthma, CKD stage IIIa, SSS s/p PPM, renal cancer s/p left nephrectomy, OSA on CPAP. Patient presented secondary to fever and shortness of breath and was found to have an influenza A infection. Associated hypoxia requiring supplemental oxygen. Tamiflu initiated.   Assessment & Plan:   Principal Problem:   Influenza A Active Problems:   Hypertension   Diabetes mellitus without complication (HCC)   High cholesterol   Asthma   Acute respiratory failure with hypoxia (HCC)   OSA on CPAP   Influenza A infection Tamiflu started -Continue Tamiflu -Supportive care  Acute respiratory failure with hypoxia Secondary to influenza infection and underlying asthma.  -Wean to room air, continuous pulse ox, ambulate with pulse ox -PT eval  Asthma exacerbation Wheezing with diminished breath sounds. Complicated by influenza infection. -Prednisone 40 mg x1 today; re-dose depending on response -Schedule albuterol QID  AKI on CKD stage IIIa Baseline creatinine of 1.1. Creatinine peak of 1.50. No BMP today -Repeat BMP  Primary hypertension Labetalol and lisinopril held secondary to hypotension. Blood pressure remains normal-low -Hold labetalol/lisinopril  Hypotension Transient. Resolved.   Diabetes mellitus, type 2 Last hemoglobin A1C of 5.7% from 2021. Patient is on metformin as an outpatient Started on SSI inpatient -Continue SSI  Hyperlipidemia -Continue Lipitor  OSA -Continue CPAP qhs   DVT prophylaxis: Lovenox Code Status:   Code Status: Full Code Family Communication: None at bedside Disposition Plan: Discharge home likely in 1-3 days pending improvement of symptoms, ability to wean oxygen   Consultants:  None  Procedures:   None  Antimicrobials: Tamiflu    Subjective: Patient with dyspnea but has improved. Not at baseline. Wheezing. Dry cough. Afebrile overnight.  Objective: Vitals:   05/30/21 2321 05/31/21 0349 05/31/21 0821 05/31/21 1133  BP: (!) 103/49 (!) 113/55    Pulse: 72 65    Resp: 18 18    Temp: 97.7 F (36.5 C) 97.9 F (36.6 C)    TempSrc: Axillary Axillary    SpO2: 98% 93% 94% 97%  Weight:      Height:        Intake/Output Summary (Last 24 hours) at 05/31/2021 1221 Last data filed at 05/31/2021 1050 Gross per 24 hour  Intake 590 ml  Output --  Net 590 ml   Filed Weights   05/29/21 1845  Weight: 102 kg    Examination:  General exam: Appears calm and comfortable  Respiratory system: Significantly diminished with wheezing bilaterally. Respiratory effort normal. Cardiovascular system: S1 & S2 heard, RRR. Gastrointestinal system: Abdomen is nondistended, soft and nontender. No organomegaly or masses felt. Normal bowel sounds heard. Central nervous system: Alert and oriented. No focal neurological deficits. Musculoskeletal: No edema. No calf tenderness Skin: No cyanosis. No rashes Psychiatry: Judgement and insight appear normal. Mood & affect appropriate.     Data Reviewed: I have personally reviewed following labs and imaging studies  CBC Lab Results  Component Value Date   WBC 4.9 05/30/2021   RBC 3.53 (L) 05/30/2021   HGB 11.0 (L) 05/30/2021   HCT 35.3 (L) 05/30/2021   MCV 100.0 05/30/2021   MCH 31.2 05/30/2021   PLT 201 05/30/2021   MCHC 31.2 05/30/2021   RDW 13.5 05/30/2021   LYMPHSABS 1.1 05/29/2021   MONOABS 0.5 05/29/2021  EOSABS 0.1 05/29/2021   BASOSABS 0.0 01/23/9484     Last metabolic panel Lab Results  Component Value Date   NA 136 05/30/2021   K 4.1 05/30/2021   CL 104 05/30/2021   CO2 24 05/30/2021   BUN 23 05/30/2021   CREATININE 1.50 (H) 05/30/2021   GLUCOSE 176 (H) 05/30/2021   GFRNONAA 35 (L) 05/30/2021   GFRAA 41 (L) 03/03/2020    CALCIUM 8.9 05/30/2021   PROT 7.4 05/20/2018   ALBUMIN 3.9 05/20/2018   BILITOT 1.0 05/20/2018   ALKPHOS 124 05/20/2018   AST 23 05/20/2018   ALT 16 05/20/2018   ANIONGAP 8 05/30/2021    CBG (last 3)  Recent Labs    05/30/21 2037 05/31/21 0746 05/31/21 1202  GLUCAP 157* 115* 130*     GFR: Estimated Creatinine Clearance: 35.3 mL/min (A) (by C-G formula based on SCr of 1.5 mg/dL (H)).  Coagulation Profile: No results for input(s): INR, PROTIME in the last 168 hours.  Recent Results (from the past 240 hour(s))  Resp Panel by RT-PCR (Flu A&B, Covid) Nasopharyngeal Swab     Status: None   Collection Time: 05/28/21  7:25 PM   Specimen: Nasopharyngeal Swab; Nasopharyngeal(NP) swabs in vial transport medium  Result Value Ref Range Status   SARS Coronavirus 2 by RT PCR NEGATIVE NEGATIVE Final    Comment: (NOTE) SARS-CoV-2 target nucleic acids are NOT DETECTED.  The SARS-CoV-2 RNA is generally detectable in upper respiratory specimens during the acute phase of infection. The lowest concentration of SARS-CoV-2 viral copies this assay can detect is 138 copies/mL. A negative result does not preclude SARS-Cov-2 infection and should not be used as the sole basis for treatment or other patient management decisions. A negative result may occur with  improper specimen collection/handling, submission of specimen other than nasopharyngeal swab, presence of viral mutation(s) within the areas targeted by this assay, and inadequate number of viral copies(<138 copies/mL). A negative result must be combined with clinical observations, patient history, and epidemiological information. The expected result is Negative.  Fact Sheet for Patients:  EntrepreneurPulse.com.au  Fact Sheet for Healthcare Providers:  IncredibleEmployment.be  This test is no t yet approved or cleared by the [place] FDA and  has been authorized for detection and/or diagnosis  of SARS-CoV-2 by FDA under an Emergency Use Authorization (EUA). This EUA will remain  in effect (meaning this test can be used) for the duration of the COVID-19 declaration under Section 564(b)(1) of the Act, 21 U.S.C.section 360bbb-3(b)(1), unless the authorization is terminated  or revoked sooner.       Influenza A by PCR NEGATIVE NEGATIVE Final   Influenza B by PCR NEGATIVE NEGATIVE Final    Comment: (NOTE) The Xpert Xpress SARS-CoV-2/FLU/RSV plus assay is intended as an aid in the diagnosis of influenza from Nasopharyngeal swab specimens and should not be used as a sole basis for treatment. Nasal washings and aspirates are unacceptable for Xpert Xpress SARS-CoV-2/FLU/RSV testing.  Fact Sheet for Patients: EntrepreneurPulse.com.au  Fact Sheet for Healthcare Providers: IncredibleEmployment.be  This test is not yet approved or cleared by the [place] FDA and has been authorized for detection and/or diagnosis of SARS-CoV-2 by FDA under an Emergency Use Authorization (EUA). This EUA will remain in effect (meaning this test can be used) for the duration of the COVID-19 declaration under Section 564(b)(1) of the Act, 21 U.S.C. section 360bbb-3(b)(1), unless the authorization is terminated or revoked.  Performed at [facility], [place],  [place] [address]   Resp Panel by RT-PCR (Flu A&B, Covid) Nasopharyngeal Swab     Status: Abnormal   Collection Time: 05/29/21  6:48 PM   Specimen: Nasopharyngeal Swab; Nasopharyngeal(NP) swabs in vial transport medium  Result Value Ref Range Status   SARS Coronavirus 2 by RT PCR NEGATIVE NEGATIVE Final    Comment: (NOTE) SARS-CoV-2 target nucleic acids are NOT DETECTED.  The SARS-CoV-2 RNA is generally detectable in upper respiratory specimens during the acute phase of infection. The lowest concentration of SARS-CoV-2 viral copies this assay can detect is 138 copies/mL. A  negative result does not preclude SARS-Cov-2 infection and should not be used as the sole basis for treatment or other patient management decisions. A negative result may occur with  improper specimen collection/handling, submission of specimen other than nasopharyngeal swab, presence of viral mutation(s) within the areas targeted by this assay, and inadequate number of viral copies(<138 copies/mL). A negative result must be combined with clinical observations, patient history, and epidemiological information. The expected result is Negative.  Fact Sheet for Patients:  EntrepreneurPulse.com.au  Fact Sheet for Healthcare Providers:  IncredibleEmployment.be  This test is no t yet approved or cleared by the [place] FDA and  has been authorized for detection and/or diagnosis of SARS-CoV-2 by FDA under an Emergency Use Authorization (EUA). This EUA will remain  in effect (meaning this test can be used) for the duration of the COVID-19 declaration under Section 564(b)(1) of the Act, 21 U.S.C.section 360bbb-3(b)(1), unless the authorization is terminated  or revoked sooner.       Influenza A by PCR POSITIVE (A) NEGATIVE Final   Influenza B by PCR NEGATIVE NEGATIVE Final    Comment: (NOTE) The Xpert Xpress SARS-CoV-2/FLU/RSV plus assay is intended as an aid in the diagnosis of influenza from Nasopharyngeal swab specimens and should not be used as a sole basis for treatment. Nasal washings and aspirates are unacceptable for Xpert Xpress SARS-CoV-2/FLU/RSV testing.  Fact Sheet for Patients: EntrepreneurPulse.com.au  Fact Sheet for Healthcare Providers: IncredibleEmployment.be  This test is not yet approved or cleared by the [place] FDA and has been authorized for detection and/or diagnosis of SARS-CoV-2 by FDA under an Emergency Use Authorization (EUA). This EUA will remain in effect (meaning this test  can be used) for the duration of the COVID-19 declaration under Section 564(b)(1) of the Act, 21 U.S.C. section 360bbb-3(b)(1), unless the authorization is terminated or revoked.  Performed at [facility], [place] [address] [place], [place] [address]   Culture, blood (routine x 2)     Status: None (Preliminary result)   Collection Time: 05/29/21  9:43 PM   Specimen: BLOOD  Result Value Ref Range Status   Specimen Description   Final    BLOOD LEFT ANTECUBITAL Performed at [facility] [address], [place], [place] [address]    Special Requests   Final    BOTTLES DRAWN AEROBIC AND ANAEROBIC Blood Culture adequate volume Performed at [facility] [address] [place], [place] [address]    Culture   Final    NO GROWTH 1 DAY Performed at [facility], [place] [address] [place], [place] [address]    Report Status PENDING  Incomplete  Culture, blood (routine x 2)     Status: None (Preliminary result)   Collection Time: 05/29/21  9:43 PM   Specimen: BLOOD  Result Value Ref Range Status   Specimen Description   Final    BLOOD  LEFT ARM Performed at [facility], [place] [address] [place], [place] [address]    Special Requests   Final    BOTTLES DRAWN AEROBIC AND ANAEROBIC Blood Culture adequate volume Performed at [facility] [address], [place], [place] [address]    Culture   Final    NO GROWTH 1 DAY Performed at [facility], [place] [address], [place], [place] [address]    Report Status PENDING  Incomplete        Radiology Studies: DG Chest Port 1 View  Result Date: 05/29/2021 CLINICAL DATA:  Shortness of breath EXAM: PORTABLE CHEST 1 VIEW COMPARISON:  05/28/2021 FINDINGS: Left pacer in place with leads in the right atrium and right ventricle, unchanged. Heart is normal size. Bibasilar opacities, new since prior study, favor atelectasis. No effusions or acute  bony abnormality. IMPRESSION: Bibasilar opacity, favor atelectasis. Electronically Signed   By: Rolm Baptise M.D.   On: 05/29/2021 19:01        Scheduled Meds:  albuterol  2.5 mg Nebulization QID   aspirin EC  81 mg Oral Daily   atorvastatin  20 mg Oral QHS   dextromethorphan  30 mg Oral BID   enoxaparin (LOVENOX) injection  40 mg Subcutaneous Q24H   escitalopram  20 mg Oral Daily   guaiFENesin  600 mg Oral BID   insulin aspart  0-9 Units Subcutaneous TID WC   mometasone-formoterol  2 puff Inhalation BID   oseltamivir  30 mg Oral BID   pramipexole  0.125-0.25 mg Oral QHS   pregabalin  25 mg Oral Daily   Continuous Infusions:   LOS: 1 day     Cordelia Poche, MD [facility] 05/31/2021, 12:21 PM  If 7PM-7AM, please contact night-coverage [email or web address]

## 2021-05-31 NOTE — Evaluation (Signed)
Physical Therapy Evaluation Patient Details Name: Cathy Richardson MRN: [id number] DOB: [birth year] Today's Date: 05/31/2021  History of Present Illness  KATELYNE GALSTER a 80 y.o. female with medical history significant for T2DM, HTN, HLD, asthma, CKD stage IIIa, SSS s/p PPM, renal cancer s/p left nephrectomy, and OSA on CPAP who presents to the ED 05/29/21 for fevers and shortness of breath, Influenza A,  Clinical Impression   Patient eager to  get up. Assisted to [facility]. Patient then ambulated in room x 25' holds to bed rails. Patient stated" I am a furniture walker.The walker does not work in my apartment."  Patient  independent in a basement apartment at daughter's home. Has a stair glide to main level.  Patient's SPO2 on 2 L 95% after mobility.  Pt admitted with above diagnosis.  Pt currently with functional limitations due to the deficits listed below (see PT Problem List). Pt will benefit from skilled PT to increase their independence and safety with mobility to allow discharge to the venue listed below.        Recommendations for follow up therapy are one component of a multi-disciplinary discharge planning process, led by the attending physician.  Recommendations may be updated based on patient status, additional functional criteria and insurance authorization.  Follow Up Recommendations Home health PT    Assistance Recommended at Discharge None  Functional Status Assessment Patient has had a recent decline in their functional status and demonstrates the ability to make significant improvements in function in a reasonable and predictable amount of time.  Equipment Recommendations  None recommended by PT    Recommendations for Other Services       Precautions / Restrictions Precautions Precautions: Fall Precaution Comments: droplet      Mobility  Bed Mobility Overal bed mobility: Modified Independent                  Transfers Overall transfer level: Needs  assistance   Transfers: Sit to/from Stand;Stand Pivot Transfers Sit to Stand: Min guard Stand pivot transfers: Min guard         General transfer comment: supports on bed to stnad, reaches for BSc for support.    Ambulation/Gait Ambulation/Gait assistance: Min guard Gait Distance (Feet): 25Feet Assistive device:  (supports on bed to walk around to recliner.) Gait Pattern/deviations: Step-to pattern;Step-through pattern;Trunk flexed Gait velocity: decr   General Gait Details: holds bed rails and foot board for support  Stairs            Wheelchair Mobility    Modified Rankin (Stroke Patients Only)       Balance Overall balance assessment: Needs assistance   Sitting balance-Leahy Scale: Normal     Standing balance support: During functional activity;Single extremity supported Standing balance-Leahy Scale: Fair Standing balance comment: able to stand and perform self pericare.                             Pertinent Vitals/Pain Pain Assessment: No/denies pain    Home Living Family/patient expects to be discharged to:: Private residence Living Arrangements: Children Available Help at Discharge: Family;Available PRN/intermittently Type of Home: House Home Access: Stairs to enter   Entrance Stairs-Number of Steps: 1 Alternate Level Stairs-Number of Steps: has stair glide from basement apartment to main Home Layout: Two level Home Equipment: Standard Walker;Cane - single point;Tub bench      Prior Function Prior Level of Function : Independent/Modified Independent  Mobility Comments: lives independently in basement apt at daughter's home, Orders groceries and family brings down, has life alert. furniture walks, Animator Dominance   Dominant Hand: Right    Extremity/Trunk Assessment   Upper Extremity Assessment Upper Extremity Assessment: Overall WFL for tasks assessed    Lower Extremity  Assessment Lower Extremity Assessment: Generalized weakness    Cervical / Trunk Assessment Cervical / Trunk Assessment: Normal  Communication   Communication: No difficulties  Cognition Arousal/Alertness: Awake/alert Behavior During Therapy: WFL for tasks assessed/performed Overall Cognitive Status: Within Functional Limits for tasks assessed                                          General Comments      Exercises     Assessment/Plan    PT Assessment Patient needs continued PT services  PT Problem List Decreased strength;Decreased activity tolerance;Decreased knowledge of precautions;Decreased safety awareness       PT Treatment Interventions DME instruction;Therapeutic activities;Gait training;Therapeutic exercise;Patient/family education;Functional mobility training    PT Goals (Current goals can be found in the Care Plan section)  Acute Rehab PT Goals Patient Stated Goal: go home PT Goal Formulation: With patient Time For Goal Achievement: 06/14/21 Potential to Achieve Goals: Good    Frequency Min 3X/week   Barriers to discharge        Co-evaluation               AM-PAC PT "6 Clicks" Mobility  Outcome Measure Help needed turning from your back to your side while in a flat bed without using bedrails?: None Help needed moving from lying on your back to sitting on the side of a flat bed without using bedrails?: None Help needed moving to and from a bed to a chair (including a wheelchair)?: A Little Help needed standing up from a chair using your arms (e.g., wheelchair or bedside chair)?: A Little Help needed to walk in hospital room?: A Little Help needed climbing 3-5 steps with a railing? : A Little 6 Click Score: 20    End of Session Equipment Utilized During Treatment: Oxygen Activity Tolerance: Patient tolerated treatment well Patient left: in chair;with call bell/phone within reach;with chair alarm set Nurse Communication: Mobility  status PT Visit Diagnosis: Unsteadiness on feet (R26.81)    Time: 1638-4665 PT Time Calculation (min) (ACUTE ONLY): 28 min   Charges:   PT Evaluation $PT Eval Low Complexity: 1 Low PT Treatments $Gait Training: 8-22 mins        [place] Pager [phone] Office [phone]   Claretha Cooper 05/31/2021, 10:05 AM

## 2021-06-01 ENCOUNTER — Encounter (HOSPITAL_COMMUNITY): Payer: Self-pay | Admitting: Internal Medicine

## 2021-06-01 DIAGNOSIS — J4521 Mild intermittent asthma with (acute) exacerbation: Secondary | ICD-10-CM

## 2021-06-01 LAB — GLUCOSE, CAPILLARY
Glucose-Capillary: 105 mg/dL — ABNORMAL HIGH (ref 70–99)
Glucose-Capillary: 130 mg/dL — ABNORMAL HIGH (ref 70–99)
Glucose-Capillary: 161 mg/dL — ABNORMAL HIGH (ref 70–99)
Glucose-Capillary: 216 mg/dL — ABNORMAL HIGH (ref 70–99)

## 2021-06-01 LAB — BASIC METABOLIC PANEL
Anion gap: 9 (ref 5–15)
BUN: 23 mg/dL (ref 8–23)
CO2: 21 mmol/L — ABNORMAL LOW (ref 22–32)
Calcium: 8.8 mg/dL — ABNORMAL LOW (ref 8.9–10.3)
Chloride: 109 mmol/L (ref 98–111)
Creatinine, Ser: 1.11 mg/dL — ABNORMAL HIGH (ref 0.44–1.00)
GFR, Estimated: 51 mL/min — ABNORMAL LOW (ref >60)
Glucose, Bld: 121 mg/dL — ABNORMAL HIGH (ref 70–99)
Potassium: 4.7 mmol/L (ref 3.5–5.1)
Sodium: 139 mmol/L (ref 135–145)

## 2021-06-01 MED ORDER — TRAZODONE HCL 50 MG PO TABS
50.0000 mg | ORAL_TABLET | Freq: Every day | ORAL | Status: DC
  Administered 2021-06-01: 50 mg via ORAL
  Filled 2021-06-01: qty 1

## 2021-06-01 MED ORDER — PREDNISONE 20 MG PO TABS
40.0000 mg | ORAL_TABLET | Freq: Every day | ORAL | Status: DC
  Administered 2021-06-01 – 2021-06-02 (×2): 40 mg via ORAL
  Filled 2021-06-01 (×2): qty 2

## 2021-06-01 NOTE — TOC Initial Note (Signed)
Transition of Care [facility]) - Initial/Assessment Note    Patient Details  Name: Cathy Richardson MRN: [id number] Date of Birth: 1940/12/22  [facility] CM/SW Contact:    Cathy Mage, LCSW Phone Number: 06/01/2021, 3:18 PM  Clinical Narrative:    Patient seen in follow up to PT recommendation of [facility].  Cathy Richardson lives here in [place] in a basement apartment of the house where her daughter and off spring live.  She has all needed DME, is able to drive herself to church and her medical appointments, and politely declines [place] services. TOC will continue to follow during the course of hospitalization.                Expected Discharge Plan: Home/Self Care Barriers to Discharge: No Barriers Identified   Patient Goals and CMS Choice        Expected Discharge Plan and Services Expected Discharge Plan: Home/Self Care   Discharge Planning Services: CM Consult   Living arrangements for the past 2 months: Single Family Home                                      Prior Living Arrangements/Services Living arrangements for the past 2 months: Single Family Home Lives with:: Adult Children Patient language and need for interpreter reviewed:: Yes        Need for Family Participation in Patient Care: Yes (Comment) Care giver support system in place?: Yes (comment)   Criminal Activity/Legal Involvement Pertinent to Current Situation/Hospitalization: No - Comment as needed  Activities of Daily Living Home Assistive Devices/Equipment: Other (Comment), CPAP, Eyeglasses (stair rider) ADL Screening (condition at time of admission) Patient's cognitive ability adequate to safely complete daily activities?: Yes Is the patient deaf or have difficulty hearing?: No Does the patient have difficulty seeing, even when wearing glasses/contacts?: No Does the patient have difficulty concentrating, remembering, or making decisions?: Yes (trouble remembering) Patient able to express  need for assistance with ADLs?: Yes Does the patient have difficulty dressing or bathing?: No Independently performs ADLs?: Yes (appropriate for developmental age) Does the patient have difficulty walking or climbing stairs?: Yes (uses a stair rider) Weakness of Legs: Both Weakness of Arms/Hands: Both  Permission Sought/Granted                  Emotional Assessment Appearance:: Appears stated age Attitude/Demeanor/Rapport: Engaged Affect (typically observed): Appropriate Orientation: : Oriented to Self, Oriented to Place, Oriented to  Time, Oriented to Situation Alcohol / Substance Use: Not Applicable Psych Involvement: No (comment)  Admission diagnosis:  Influenza A [J10.1] Acute respiratory failure with hypoxia ([place]) [J96.01] Patient Active Problem List   Diagnosis Date Noted   Influenza A 05/29/2021   Acute respiratory failure with hypoxia ([place]) 05/29/2021   OSA on CPAP 05/29/2021   Adenocarcinoma of transverse colon ([place]) 11/12/2015   Hypertension 11/12/2015   GERD (gastroesophageal reflux disease) 11/12/2015   Diabetes mellitus without complication ([place]) 52/84/1324   Vitamin D deficiency 11/12/2015   High cholesterol 11/12/2015   Depression 11/12/2015   Chronic pain syndrome 11/12/2015   Asthma 11/12/2015   Sublingual gland swelling 01/05/2015   Prolapsed sublingual gland 12/30/2014   PCP:  Cena Benton, MD Pharmacy:   [place], [place] [address] Phone: [phone] Fax: [phone]     Social Determinants of  Health (SDOH) Interventions    Readmission Risk Interventions No flowsheet data found.

## 2021-06-01 NOTE — Progress Notes (Addendum)
Progress Note    Cathy Richardson  AYT:[id number] DOB: 1941-01-15  DOA: 05/29/2021 PCP: Cena Benton, MD      Brief Narrative:    Medical records reviewed and are as summarized below:  Cathy Richardson is a 80 y.o. female with medical history significant for type II DM, hypertension, hyperlipidemia, asthma, CKD stage IIIa, sick sinus syndrome s/p permanent pacemaker, renal cancer s/p left frenectomy nephrectomy, OSA on CPAP, who presented to the hospital with fever, shortness of breath.  She was found to have influenza A infection, acute hypoxemic respiratory failure and asthma exacerbation.      Assessment/Plan:   Principal Problem:   Influenza A Active Problems:   Hypertension   Diabetes mellitus without complication (HCC)   High cholesterol   Asthma   Acute respiratory failure with hypoxia (HCC)   OSA on CPAP    Body mass index is 38.6 kg/m.  (Morbid obesity)  Infection: Continue Tamiflu and supportive care  Acute asthma exacerbation: Continue prednisone and albuterol  AKI on CKD stage IIIa: Creatinine has improved.  Acute hypoxemic respiratory failure, hypotension: Resolved  Comorbidities include type II DM, hyperlipidemia, hypertension, OSA on CPAP  Diet Order             Diet Carb Modified Fluid consistency: Thin; Room service appropriate? Yes  Diet effective now                      Consultants: None  Procedures: None    Medications:    albuterol  2.5 mg Nebulization QID   aspirin EC  81 mg Oral Daily   atorvastatin  20 mg Oral QHS   dextromethorphan  30 mg Oral BID   enoxaparin (LOVENOX) injection  40 mg Subcutaneous Q24H   escitalopram  20 mg Oral Daily   fentaNYL  1 patch Transdermal Q72H   guaiFENesin  600 mg Oral BID   insulin aspart  0-9 Units Subcutaneous TID WC   mometasone-formoterol  2 puff Inhalation BID   oseltamivir  30 mg Oral BID   pramipexole  0.125-0.25 mg Oral QHS   pregabalin  25 mg Oral Daily    Continuous Infusions:   Anti-infectives (From admission, onward)    Start     Dose/Rate Route Frequency Ordered Stop   05/29/21 2046  oseltamivir (TAMIFLU) capsule 30 mg        30 mg Oral 2 times daily 05/29/21 2030 06/03/21 2159   05/29/21 2015  oseltamivir (TAMIFLU) capsule 75 mg  Status:  Discontinued        75 mg Oral  Once 05/29/21 2006 05/29/21 2030              Family Communication/Anticipated D/C date and plan/Code Status   DVT prophylaxis: enoxaparin (LOVENOX) injection 40 mg Start: 05/29/21 2200     Code Status: Full Code  Family Communication: None Disposition Plan: Possible discharge to home tomorrow   Status is: Inpatient  Remains inpatient appropriate because: Generalized weakness, asthma exacerbation           Subjective:   "I don't feel good". She didin't sleep last night because of withdrawal from "pain medicine patch".  She complains of generalized weakness, cough productive of yellowish thick sputum.    Objective:    Vitals:   05/31/21 2031 06/01/21 0053 06/01/21 0413 06/01/21 0824  BP: (!) 146/67  (!) 148/64   Pulse: 100 68 68   Resp: 20 18 20  Temp: 98.9 F (37.2 C)  97.8 F (36.6 C)   TempSrc: Axillary  Oral   SpO2: 93% 95% 95% 96%  Weight:      Height:       No data found.   Intake/Output Summary (Last 24 hours) at 06/01/2021 0909 Last data filed at 05/31/2021 1853 Gross per 24 hour  Intake 1180 ml  Output --  Net 1180 ml   Filed Weights   05/29/21 1845  Weight: 102 kg    Exam:  GEN: NAD SKIN: No rash EYES: EOMI ENT: MMM CV: RRR PULM: Decreased air entry bilaterally, b/l wheezing ABD: soft, obese, NT, +BS CNS: AAO x 3, non focal EXT: No edema or tenderness        Data Reviewed:   I have personally reviewed following labs and imaging studies:  Labs: Labs show the following:   Basic Metabolic Panel: Recent Labs  Lab 05/28/21 2103 05/29/21 1844 05/30/21 0559 05/31/21 1331  06/01/21 0449  NA 137 138 136 137 139  K 4.0 4.0 4.1 3.8 4.7  CL 104 104 104 106 109  CO2 25 28 24 23  21*  GLUCOSE 113* 122* 176* 184* 121*  BUN 20 17 23  30* 23  CREATININE 1.10* 1.10* 1.50* 1.42* 1.11*  CALCIUM 9.2 9.3 8.9 8.5* 8.8*   GFR Estimated Creatinine Clearance: 47.7 mL/min (A) (by C-G formula based on SCr of 1.11 mg/dL (H)). Liver Function Tests: No results for input(s): AST, ALT, ALKPHOS, BILITOT, PROT, ALBUMIN in the last 168 hours. No results for input(s): LIPASE, AMYLASE in the last 168 hours. No results for input(s): AMMONIA in the last 168 hours. Coagulation profile No results for input(s): INR, PROTIME in the last 168 hours.  CBC: Recent Labs  Lab 05/28/21 1925 05/29/21 1844 05/30/21 0559  WBC 4.5 6.1 4.9  NEUTROABS 2.9 4.5  --   HGB 12.5 11.1* 11.0*  HCT 38.2 35.1* 35.3*  MCV 96.5 98.3 100.0  PLT 253 206 201   Cardiac Enzymes: No results for input(s): CKTOTAL, CKMB, CKMBINDEX, TROPONINI in the last 168 hours. BNP (last 3 results) No results for input(s): PROBNP in the last 8760 hours. CBG: Recent Labs  Lab 05/31/21 0746 05/31/21 1202 05/31/21 1634 05/31/21 2033 06/01/21 0803  GLUCAP 115* 130* 197* 250* 105*   D-Dimer: No results for input(s): DDIMER in the last 72 hours. Hgb A1c: No results for input(s): HGBA1C in the last 72 hours. Lipid Profile: No results for input(s): CHOL, HDL, LDLCALC, TRIG, CHOLHDL, LDLDIRECT in the last 72 hours. Thyroid function studies: No results for input(s): TSH, T4TOTAL, T3FREE, THYROIDAB in the last 72 hours.  Invalid input(s): FREET3 Anemia work up: No results for input(s): VITAMINB12, FOLATE, FERRITIN, TIBC, IRON, RETICCTPCT in the last 72 hours. Sepsis Labs: Recent Labs  Lab 05/28/21 1925 05/29/21 1844 05/29/21 2143 05/30/21 0559  WBC 4.5 6.1  --  4.9  LATICACIDVEN  --   --  1.2  --     Microbiology Recent Results (from the past 240 hour(s))  Resp Panel by RT-PCR (Flu A&B, Covid)  Nasopharyngeal Swab     Status: None   Collection Time: 05/28/21  7:25 PM   Specimen: Nasopharyngeal Swab; Nasopharyngeal(NP) swabs in vial transport medium  Result Value Ref Range Status   SARS Coronavirus 2 by RT PCR NEGATIVE NEGATIVE Final    Comment: (NOTE) SARS-CoV-2 target nucleic acids are NOT DETECTED.  The SARS-CoV-2 RNA is generally detectable in upper respiratory specimens during the acute phase of infection. The  lowest concentration of SARS-CoV-2 viral copies this assay can detect is 138 copies/mL. A negative result does not preclude SARS-Cov-2 infection and should not be used as the sole basis for treatment or other patient management decisions. A negative result may occur with  improper specimen collection/handling, submission of specimen other than nasopharyngeal swab, presence of viral mutation(s) within the areas targeted by this assay, and inadequate number of viral copies(<138 copies/mL). A negative result must be combined with clinical observations, patient history, and epidemiological information. The expected result is Negative.  Fact Sheet for Patients:  EntrepreneurPulse.com.au  Fact Sheet for Healthcare Providers:  IncredibleEmployment.be  This test is no t yet approved or cleared by the [place] FDA and  has been authorized for detection and/or diagnosis of SARS-CoV-2 by FDA under an Emergency Use Authorization (EUA). This EUA will remain  in effect (meaning this test can be used) for the duration of the COVID-19 declaration under Section 564(b)(1) of the Act, 21 U.S.C.section 360bbb-3(b)(1), unless the authorization is terminated  or revoked sooner.       Influenza A by PCR NEGATIVE NEGATIVE Final   Influenza B by PCR NEGATIVE NEGATIVE Final    Comment: (NOTE) The Xpert Xpress SARS-CoV-2/FLU/RSV plus assay is intended as an aid in the diagnosis of influenza from Nasopharyngeal swab specimens and should not be  used as a sole basis for treatment. Nasal washings and aspirates are unacceptable for Xpert Xpress SARS-CoV-2/FLU/RSV testing.  Fact Sheet for Patients: EntrepreneurPulse.com.au  Fact Sheet for Healthcare Providers: IncredibleEmployment.be  This test is not yet approved or cleared by the [place] FDA and has been authorized for detection and/or diagnosis of SARS-CoV-2 by FDA under an Emergency Use Authorization (EUA). This EUA will remain in effect (meaning this test can be used) for the duration of the COVID-19 declaration under Section 564(b)(1) of the Act, 21 U.S.C. section 360bbb-3(b)(1), unless the authorization is terminated or revoked.  Performed at [facility], [place] [address], [place],  [address]   Resp Panel by RT-PCR (Flu A&B, Covid) Nasopharyngeal Swab     Status: Abnormal   Collection Time: 05/29/21  6:48 PM   Specimen: Nasopharyngeal Swab; Nasopharyngeal(NP) swabs in vial transport medium  Result Value Ref Range Status   SARS Coronavirus 2 by RT PCR NEGATIVE NEGATIVE Final    Comment: (NOTE) SARS-CoV-2 target nucleic acids are NOT DETECTED.  The SARS-CoV-2 RNA is generally detectable in upper respiratory specimens during the acute phase of infection. The lowest concentration of SARS-CoV-2 viral copies this assay can detect is 138 copies/mL. A negative result does not preclude SARS-Cov-2 infection and should not be used as the sole basis for treatment or other patient management decisions. A negative result may occur with  improper specimen collection/handling, submission of specimen other than nasopharyngeal swab, presence of viral mutation(s) within the areas targeted by this assay, and inadequate number of viral copies(<138 copies/mL). A negative result must be combined with clinical observations, patient history, and epidemiological information. The expected result is Negative.  Fact Sheet for Patients:   EntrepreneurPulse.com.au  Fact Sheet for Healthcare Providers:  IncredibleEmployment.be  This test is no t yet approved or cleared by the [place] FDA and  has been authorized for detection and/or diagnosis of SARS-CoV-2 by FDA under an Emergency Use Authorization (EUA). This EUA will remain  in effect (meaning this test can be used) for the duration of the COVID-19 declaration under Section 564(b)(1) of the Act, 21 U.S.C.section 360bbb-3(b)(1), unless the authorization is  terminated  or revoked sooner.       Influenza A by PCR POSITIVE (A) NEGATIVE Final   Influenza B by PCR NEGATIVE NEGATIVE Final    Comment: (NOTE) The Xpert Xpress SARS-CoV-2/FLU/RSV plus assay is intended as an aid in the diagnosis of influenza from Nasopharyngeal swab specimens and should not be used as a sole basis for treatment. Nasal washings and aspirates are unacceptable for Xpert Xpress SARS-CoV-2/FLU/RSV testing.  Fact Sheet for Patients: EntrepreneurPulse.com.au  Fact Sheet for Healthcare Providers: IncredibleEmployment.be  This test is not yet approved or cleared by the [place] FDA and has been authorized for detection and/or diagnosis of SARS-CoV-2 by FDA under an Emergency Use Authorization (EUA). This EUA will remain in effect (meaning this test can be used) for the duration of the COVID-19 declaration under Section 564(b)(1) of the Act, 21 U.S.C. section 360bbb-3(b)(1), unless the authorization is terminated or revoked.  Performed at [facility], [place] [address], [place], [place] [address]   Culture, blood (routine x 2)     Status: None (Preliminary result)   Collection Time: 05/29/21  9:43 PM   Specimen: BLOOD  Result Value Ref Range Status   Specimen Description   Final    BLOOD LEFT ANTECUBITAL Performed at [facility] [address] [place], [place]  [address]    Special Requests   Final    BOTTLES DRAWN AEROBIC AND ANAEROBIC Blood Culture adequate volume Performed at [facility] [address] [place], [place] [address]    Culture   Final    NO GROWTH 2 DAYS Performed at [facility] [address], [place], [place] [address]    Report Status PENDING  Incomplete  Culture, blood (routine x 2)     Status: None (Preliminary result)   Collection Time: 05/29/21  9:43 PM   Specimen: BLOOD  Result Value Ref Range Status   Specimen Description   Final    BLOOD LEFT ARM Performed at [facility] [address], [place], [place] [address]    Special Requests   Final    BOTTLES DRAWN AEROBIC AND ANAEROBIC Blood Culture adequate volume Performed at [facility] [address], [place], [place] [address]    Culture   Final    NO GROWTH 2 DAYS Performed at [facility] [address], [place], [place] [address]    Report Status PENDING  Incomplete    Procedures and diagnostic studies:  No results found.             LOS: 2 days   Cathy Richardson  [facility]   Pager on [email or web address]. If 7PM-7AM, please contact night-coverage at [email or web address]     06/01/2021, 9:09 AM

## 2021-06-02 ENCOUNTER — Other Ambulatory Visit (HOSPITAL_COMMUNITY): Payer: Self-pay

## 2021-06-02 LAB — GLUCOSE, CAPILLARY
Glucose-Capillary: 105 mg/dL — ABNORMAL HIGH (ref 70–99)
Glucose-Capillary: 166 mg/dL — ABNORMAL HIGH (ref 70–99)

## 2021-06-02 MED ORDER — OSELTAMIVIR PHOSPHATE 30 MG PO CAPS: 30.0000 mg | ORAL_CAPSULE | Freq: Two times a day (BID) | ORAL | 0 refills | Status: DC

## 2021-06-02 MED ORDER — PREDNISONE 20 MG PO TABS
40.0000 mg | ORAL_TABLET | Freq: Every day | ORAL | 0 refills | Status: AC
Start: 2021-06-03 — End: 2021-06-05

## 2021-06-02 MED ORDER — ALBUTEROL SULFATE (2.5 MG/3ML) 0.083% IN NEBU
2.5000 mg | INHALATION_SOLUTION | Freq: Three times a day (TID) | RESPIRATORY_TRACT | Status: DC
  Filled 2021-06-02: qty 3

## 2021-06-02 MED ORDER — OSELTAMIVIR PHOSPHATE 30 MG PO CAPS
30.0000 mg | ORAL_CAPSULE | Freq: Two times a day (BID) | ORAL | 0 refills | Status: AC
  Filled 2021-06-02: qty 10, 5d supply, fill #0
  Filled 2021-06-02: qty 2, 1d supply, fill #0

## 2021-06-02 NOTE — Care Management Important Message (Signed)
Important Message  Patient Details IM Letter given to the Patient. Name: AVIAH SORCI MRN: [id number] Date of Birth: 10/06/1940   Medicare Important Message Given:  Yes     Kerin Salen 06/02/2021, 1:44 PM

## 2021-06-02 NOTE — Progress Notes (Signed)
Discharge instructions given to and reviewed with patient and her daughter. Patient and daughter verbalized understanding of all discharge instructions including follow up appointment, new prescriptions and home medication list. Tamiflu prescription from outpatient pharmacy given to patient.

## 2021-06-02 NOTE — Progress Notes (Signed)
Patient left unit alert by wheelchair with Bre, NT and daughter at side. Patient discharged home with daughter driving.

## 2021-06-02 NOTE — Discharge Summary (Signed)
Physician Discharge Summary  Cathy Richardson:[id number] DOB: 03-27-1941 DOA: 05/29/2021  PCP: Cena Benton, MD  Admit date: 05/29/2021 Discharge date: 06/02/2021  Discharge disposition: Home   Recommendations for Outpatient Follow-Up:   Follow-up with PCP in 1 week diabetes, hypertension,   Discharge Diagnosis:   Principal Problem:   Influenza A Active Problems:   Hypertension   Diabetes mellitus without complication ([place])   High cholesterol   Asthma   Acute respiratory failure with hypoxia (HCC)   OSA on CPAP    Discharge Condition: Stable.  Diet recommendation:  Diet Order             Diet - low sodium heart healthy           Diet Carb Modified           Diet Carb Modified Fluid consistency: Thin; Room service appropriate? Yes  Diet effective now                     Code Status: Full Code     Hospital Course:     Cathy Richardson is a 80 y.o. female with medical history significant for type II DM, hypertension, hyperlipidemia, asthma, CKD stage IIIa, sick sinus syndrome s/p permanent pacemaker, renal cancer s/p left frenectomy nephrectomy, OSA on CPAP, who presented to the hospital with fever, shortness of breath.  She was found to have influenza A infection, acute hypoxemic respiratory failure and asthma exacerbation.   She was treated with Tamiflu, steroids and bronchodilators.  She required supplemental oxygen for acute hypoxemic respiratory failure.  Her condition is improved and she is deemed stable for discharge to home today.  Discharge plan was discussed with the patient and her eldest daughter at the bedside.       Discharge Exam:    Vitals:   06/01/21 2220 06/02/21 0530 06/02/21 0743 06/02/21 0840  BP:  (!) 141/51    Pulse:  72    Resp: 14 20  17   Temp:  98.4 F (36.9 C)    TempSrc:  Oral    SpO2:  91% 90%   Weight:      Height:         GEN: NAD SKIN: No rash on exposed skin EYES: No pallor or  icterus ENT: MMM CV: RRR PULM: CTA B ABD: soft, obese, NT, +BS CNS: AAO x 3, non focal EXT: No edema or tenderness   The results of significant diagnostics from this hospitalization (including imaging, microbiology, ancillary and laboratory) are listed below for reference.     Procedures and Diagnostic Studies:   DG Chest Port 1 View  Result Date: 05/29/2021 CLINICAL DATA:  Shortness of breath EXAM: PORTABLE CHEST 1 VIEW COMPARISON:  05/28/2021 FINDINGS: Left pacer in place with leads in the right atrium and right ventricle, unchanged. Heart is normal size. Bibasilar opacities, new since prior study, favor atelectasis. No effusions or acute bony abnormality. IMPRESSION: Bibasilar opacity, favor atelectasis. Electronically Signed   By: Rolm Baptise M.D.   On: 05/29/2021 19:01     Labs:   Basic Metabolic Panel: Recent Labs  Lab 05/28/21 2103 05/29/21 1844 05/30/21 0559 05/31/21 1331 06/01/21 0449  NA 137 138 136 137 139  K 4.0 4.0 4.1 3.8 4.7  CL 104 104 104 106 109  CO2 25 28 24 23  21*  GLUCOSE 113* 122* 176* 184* 121*  BUN 20 17 23  30* 23  CREATININE 1.10* 1.10* 1.50* 1.42* 1.11*  CALCIUM 9.2 9.3 8.9 8.5* 8.8*   GFR Estimated Creatinine Clearance: 47.7 mL/min (A) (by C-G formula based on SCr of 1.11 mg/dL (H)). Liver Function Tests: No results for input(s): AST, ALT, ALKPHOS, BILITOT, PROT, ALBUMIN in the last 168 hours. No results for input(s): LIPASE, AMYLASE in the last 168 hours. No results for input(s): AMMONIA in the last 168 hours. Coagulation profile No results for input(s): INR, PROTIME in the last 168 hours.  CBC: Recent Labs  Lab 05/28/21 1925 05/29/21 1844 05/30/21 0559  WBC 4.5 6.1 4.9  NEUTROABS 2.9 4.5  --   HGB 12.5 11.1* 11.0*  HCT 38.2 35.1* 35.3*  MCV 96.5 98.3 100.0  PLT 253 206 201   Cardiac Enzymes: No results for input(s): CKTOTAL, CKMB, CKMBINDEX, TROPONINI in the last 168 hours. BNP: Invalid input(s): POCBNP CBG: Recent Labs   Lab 06/01/21 0803 06/01/21 1236 06/01/21 1725 06/01/21 2002 06/02/21 0745  GLUCAP 105* 130* 161* 216* 105*   D-Dimer No results for input(s): DDIMER in the last 72 hours. Hgb A1c No results for input(s): HGBA1C in the last 72 hours. Lipid Profile No results for input(s): CHOL, HDL, LDLCALC, TRIG, CHOLHDL, LDLDIRECT in the last 72 hours. Thyroid function studies No results for input(s): TSH, T4TOTAL, T3FREE, THYROIDAB in the last 72 hours.  Invalid input(s): FREET3 Anemia work up No results for input(s): VITAMINB12, FOLATE, FERRITIN, TIBC, IRON, RETICCTPCT in the last 72 hours. Microbiology Recent Results (from the past 240 hour(s))  Resp Panel by RT-PCR (Flu A&B, Covid) Nasopharyngeal Swab     Status: None   Collection Time: 05/28/21  7:25 PM   Specimen: Nasopharyngeal Swab; Nasopharyngeal(NP) swabs in vial transport medium  Result Value Ref Range Status   SARS Coronavirus 2 by RT PCR NEGATIVE NEGATIVE Final    Comment: (NOTE) SARS-CoV-2 target nucleic acids are NOT DETECTED.  The SARS-CoV-2 RNA is generally detectable in upper respiratory specimens during the acute phase of infection. The lowest concentration of SARS-CoV-2 viral copies this assay can detect is 138 copies/mL. A negative result does not preclude SARS-Cov-2 infection and should not be used as the sole basis for treatment or other patient management decisions. A negative result may occur with  improper specimen collection/handling, submission of specimen other than nasopharyngeal swab, presence of viral mutation(s) within the areas targeted by this assay, and inadequate number of viral copies(<138 copies/mL). A negative result must be combined with clinical observations, patient history, and epidemiological information. The expected result is Negative.  Fact Sheet for Patients:  EntrepreneurPulse.com.au  Fact Sheet for Healthcare Providers:   IncredibleEmployment.be  This test is no t yet approved or cleared by the [place] FDA and  has been authorized for detection and/or diagnosis of SARS-CoV-2 by FDA under an Emergency Use Authorization (EUA). This EUA will remain  in effect (meaning this test can be used) for the duration of the COVID-19 declaration under Section 564(b)(1) of the Act, 21 U.S.C.section 360bbb-3(b)(1), unless the authorization is terminated  or revoked sooner.       Influenza A by PCR NEGATIVE NEGATIVE Final   Influenza B by PCR NEGATIVE NEGATIVE Final    Comment: (NOTE) The Xpert Xpress SARS-CoV-2/FLU/RSV plus assay is intended as an aid in the diagnosis of influenza from Nasopharyngeal swab specimens and should not be used as a sole basis for treatment. Nasal washings and aspirates are unacceptable for Xpert Xpress SARS-CoV-2/FLU/RSV testing.  Fact Sheet for Patients: EntrepreneurPulse.com.au  Fact Sheet for Healthcare Providers: IncredibleEmployment.be  This test is  not yet approved or cleared by the [place] and has been authorized for detection and/or diagnosis of SARS-CoV-2 by FDA under an Emergency Use Authorization (EUA). This EUA will remain in effect (meaning this test can be used) for the duration of the COVID-19 declaration under Section 564(b)(1) of the Act, 21 U.S.C. section 360bbb-3(b)(1), unless the authorization is terminated or revoked.  Performed at [facility], [place] [address], [place], [place] [address]   Resp Panel by RT-PCR (Flu A&B, Covid) Nasopharyngeal Swab     Status: Abnormal   Collection Time: 05/29/21  6:48 PM   Specimen: Nasopharyngeal Swab; Nasopharyngeal(NP) swabs in vial transport medium  Result Value Ref Range Status   SARS Coronavirus 2 by RT PCR NEGATIVE NEGATIVE Final    Comment: (NOTE) SARS-CoV-2 target nucleic acids are NOT DETECTED.  The SARS-CoV-2 RNA is generally detectable  in upper respiratory specimens during the acute phase of infection. The lowest concentration of SARS-CoV-2 viral copies this assay can detect is 138 copies/mL. A negative result does not preclude SARS-Cov-2 infection and should not be used as the sole basis for treatment or other patient management decisions. A negative result may occur with  improper specimen collection/handling, submission of specimen other than nasopharyngeal swab, presence of viral mutation(s) within the areas targeted by this assay, and inadequate number of viral copies(<138 copies/mL). A negative result must be combined with clinical observations, patient history, and epidemiological information. The expected result is Negative.  Fact Sheet for Patients:  EntrepreneurPulse.com.au  Fact Sheet for Healthcare Providers:  IncredibleEmployment.be  This test is no t yet approved or cleared by the [place] FDA and  has been authorized for detection and/or diagnosis of SARS-CoV-2 by FDA under an Emergency Use Authorization (EUA). This EUA will remain  in effect (meaning this test can be used) for the duration of the COVID-19 declaration under Section 564(b)(1) of the Act, 21 U.S.C.section 360bbb-3(b)(1), unless the authorization is terminated  or revoked sooner.       Influenza A by PCR POSITIVE (A) NEGATIVE Final   Influenza B by PCR NEGATIVE NEGATIVE Final    Comment: (NOTE) The Xpert Xpress SARS-CoV-2/FLU/RSV plus assay is intended as an aid in the diagnosis of influenza from Nasopharyngeal swab specimens and should not be used as a sole basis for treatment. Nasal washings and aspirates are unacceptable for Xpert Xpress SARS-CoV-2/FLU/RSV testing.  Fact Sheet for Patients: EntrepreneurPulse.com.au  Fact Sheet for Healthcare Providers: IncredibleEmployment.be  This test is not yet approved or cleared by the [place] FDA  and has been authorized for detection and/or diagnosis of SARS-CoV-2 by FDA under an Emergency Use Authorization (EUA). This EUA will remain in effect (meaning this test can be used) for the duration of the COVID-19 declaration under Section 564(b)(1) of the Act, 21 U.S.C. section 360bbb-3(b)(1), unless the authorization is terminated or revoked.  Performed at [facility], [place] [address] [place], [place] [address]   Culture, blood (routine x 2)     Status: None (Preliminary result)   Collection Time: 05/29/21  9:43 PM   Specimen: BLOOD  Result Value Ref Range Status   Specimen Description   Final    BLOOD LEFT ANTECUBITAL Performed at [facility] [address], [place], [place] [address]    Special Requests   Final    BOTTLES DRAWN AEROBIC AND ANAEROBIC Blood Culture adequate volume Performed at [facility] [address] [place], [place] [address]  Culture   Final    NO GROWTH 2 DAYS Performed at [facility], [place] [address] [place], [place] [address]    Report Status PENDING  Incomplete  Culture, blood (routine x 2)     Status: None (Preliminary result)   Collection Time: 05/29/21  9:43 PM   Specimen: BLOOD  Result Value Ref Range Status   Specimen Description   Final    BLOOD LEFT ARM Performed at [facility] [address], [place], [place] [address]    Special Requests   Final    BOTTLES DRAWN AEROBIC AND ANAEROBIC Blood Culture adequate volume Performed at [facility] [address] [place], [place] [address]    Culture   Final    NO GROWTH 2 DAYS Performed at [facility] [address], [place], [place] [address]    Report Status PENDING  Incomplete     Discharge Instructions:   Discharge Instructions     Diet - low sodium heart healthy   Complete by: As directed    Diet Carb Modified   Complete by: As directed    Increase activity  slowly   Complete by: As directed       Allergies as of 06/02/2021       Reactions   Gabapentin Shortness Of Breath   Fluoxetine    Other reaction(s): Other (See Comments) Severe headache   Iodine-131 Itching, Swelling   And swollen eyelids    Naproxen Itching   Reglan [metoclopramide] Other (See Comments)   dizzy   Tizanidine Other (See Comments)   Confusion    Ivp Dye [iodinated Diagnostic Agents] Rash        Medication List     STOP taking these medications    diclofenac Sodium 1 % Gel Commonly known as: Voltaren   ondansetron 4 MG tablet Commonly known as: ZOFRAN       TAKE these medications    acetaminophen 500 MG tablet Commonly known as: TYLENOL Take 1,000 mg by mouth every 6 (six) hours as needed.   albuterol (2.5 MG/3ML) 0.083% nebulizer solution Commonly known as: PROVENTIL Take 3 mLs (2.5 mg total) by nebulization every 6 (six) hours as needed for wheezing or shortness of breath.   aspirin EC 81 MG tablet Take 81 mg by mouth daily.   atorvastatin 20 MG tablet Commonly known as: LIPITOR Take 20 mg by mouth at bedtime.   calcium carbonate 500 MG chewable tablet Commonly known as: TUMS - dosed in mg elemental calcium Chew 2 tablets by mouth every morning.   Cholecalciferol 50 MCG (2000 UT) Tabs Take 2,000 Units by mouth daily.   cyclobenzaprine 10 MG tablet Commonly known as: FLEXERIL Take 10 mg by mouth daily as needed for muscle spasms.   denosumab 60 MG/ML Sosy injection Commonly known as: PROLIA Inject 60 mg into the skin every 6 (six) months.   escitalopram 20 MG tablet Commonly known as: LEXAPRO Take 20 mg by mouth daily.   fentaNYL 25 MCG/HR Commonly known as: [place] 1 patch onto the skin every 3 (three) days.   Fish Oil 1000 MG Caps Take 1,000 mg by mouth daily. What changed: Another medication with the same name was removed. Continue taking this medication, and follow the directions you see here.   fluticasone  50 MCG/ACT nasal spray Commonly known as: FLONASE Place 2 sprays into both nostrils daily.   lisinopril 10 MG tablet Commonly known as: ZESTRIL Take  10 tablets by mouth daily.   metFORMIN 500 MG 24 hr tablet Commonly known as: GLUCOPHAGE-XR Take 500 mg by mouth daily before supper.   metoprolol tartrate 100 MG tablet Commonly known as: LOPRESSOR Take 100 mg by mouth 2 (two) times daily.   mometasone-formoterol 200-5 MCG/ACT Aero Commonly known as: DULERA Inhale 2 puffs into the lungs 2 (two) times daily as needed.   multivitamin tablet Take 1 tablet by mouth daily.   oseltamivir 30 MG capsule Commonly known as: TAMIFLU Take 1 capsule (30 mg total) by mouth 2 (two) times daily for 2 doses.   pantoprazole 40 MG tablet Commonly known as: PROTONIX Take 40 mg by mouth.   phenylephrine 10 MG Tabs tablet Commonly known as: SUDAFED PE Take 10 mg by mouth every 6 (six) hours as needed (congestion).   pramipexole 0.125 MG tablet Commonly known as: MIRAPEX Take 0.125-0.25 mg by mouth See admin instructions. Taking one or two tablets at bedtime for restless leg   predniSONE 20 MG tablet Commonly known as: DELTASONE Take 2 tablets (40 mg total) by mouth daily with breakfast for 2 days. Start taking on: June 03, 2021   pregabalin 25 MG capsule Commonly known as: LYRICA Take 25 mg by mouth in the morning and at bedtime.   silver sulfADIAZINE 1 % cream Commonly known as: SILVADENE Apply 1 application topically 2 (two) times daily as needed (rash).   traMADol 50 MG tablet Commonly known as: ULTRAM Take 1 tablet by mouth every 8 (eight) hours as needed.           If you experience worsening of your admission symptoms, develop shortness of breath, life threatening emergency, suicidal or homicidal thoughts you must seek medical attention immediately by calling 911 or calling your MD immediately  if symptoms less severe.   You must read complete instructions/literature  along with all the possible adverse reactions/side effects for all the medicines you take and that have been prescribed to you. Take any new medicines after you have completely understood and accept all the possible adverse reactions/side effects.    Please note   You were cared for by a hospitalist during your hospital stay. If you have any questions about your discharge medications or the care you received while you were in the hospital after you are discharged, you can call the unit and asked to speak with the hospitalist on call if the hospitalist that took care of you is not available. Once you are discharged, your primary care physician will handle any further medical issues. Please note that NO REFILLS for any discharge medications will be authorized once you are discharged, as it is imperative that you return to your primary care physician (or establish a relationship with a primary care physician if you do not have one) for your aftercare needs so that they can reassess your need for medications and monitor your lab values.       Time coordinating discharge: 34 minutes  Signed:  Jamille Fisher  [facility] 06/02/2021, 10:44 AM   Pager on [email or web address]. If 7PM-7AM, please contact night-coverage at [email or web address]

## 2021-06-04 LAB — CULTURE, BLOOD (ROUTINE X 2)
Culture: NO GROWTH
Culture: NO GROWTH
Special Requests: ADEQUATE
Special Requests: ADEQUATE

## 2021-06-26 ENCOUNTER — Emergency Department (HOSPITAL_BASED_OUTPATIENT_CLINIC_OR_DEPARTMENT_OTHER): Payer: Medicare Other

## 2021-06-26 ENCOUNTER — Encounter (HOSPITAL_BASED_OUTPATIENT_CLINIC_OR_DEPARTMENT_OTHER): Payer: Self-pay | Admitting: *Deleted

## 2021-06-26 ENCOUNTER — Emergency Department (HOSPITAL_BASED_OUTPATIENT_CLINIC_OR_DEPARTMENT_OTHER)
Admission: EM | Admit: 2021-06-26 | Discharge: 2021-06-26 | Disposition: A | Discharge status: Home / Self Care | Payer: Medicare Other | Attending: Emergency Medicine | Admitting: Emergency Medicine

## 2021-06-26 ENCOUNTER — Other Ambulatory Visit: Payer: Self-pay

## 2021-06-26 DIAGNOSIS — E119 Type 2 diabetes mellitus without complications: Secondary | ICD-10-CM | POA: Insufficient documentation

## 2021-06-26 DIAGNOSIS — Z7984 Long term (current) use of oral hypoglycemic drugs: Secondary | ICD-10-CM | POA: Insufficient documentation

## 2021-06-26 DIAGNOSIS — J40 Bronchitis, not specified as acute or chronic: Secondary | ICD-10-CM | POA: Diagnosis not present

## 2021-06-26 DIAGNOSIS — R0789 Other chest pain: Secondary | ICD-10-CM | POA: Diagnosis present

## 2021-06-26 DIAGNOSIS — Z7951 Long term (current) use of inhaled steroids: Secondary | ICD-10-CM | POA: Diagnosis not present

## 2021-06-26 DIAGNOSIS — Z85528 Personal history of other malignant neoplasm of kidney: Secondary | ICD-10-CM | POA: Insufficient documentation

## 2021-06-26 DIAGNOSIS — Z96652 Presence of left artificial knee joint: Secondary | ICD-10-CM | POA: Diagnosis not present

## 2021-06-26 DIAGNOSIS — Z7982 Long term (current) use of aspirin: Secondary | ICD-10-CM | POA: Insufficient documentation

## 2021-06-26 DIAGNOSIS — Z20822 Contact with and (suspected) exposure to covid-19: Secondary | ICD-10-CM | POA: Insufficient documentation

## 2021-06-26 DIAGNOSIS — I10 Essential (primary) hypertension: Secondary | ICD-10-CM | POA: Diagnosis not present

## 2021-06-26 LAB — URINALYSIS, ROUTINE W REFLEX MICROSCOPIC
Bilirubin Urine: NEGATIVE
Glucose, UA: NEGATIVE mg/dL
Hgb urine dipstick: NEGATIVE
Ketones, ur: NEGATIVE mg/dL
Leukocytes,Ua: NEGATIVE
Nitrite: NEGATIVE
Protein, ur: NEGATIVE mg/dL
Specific Gravity, Urine: 1.015 (ref 1.005–1.030)
pH: 5.5 (ref 5.0–8.0)

## 2021-06-26 LAB — CBC WITH DIFFERENTIAL/PLATELET
Abs Immature Granulocytes: 0.03 10*3/uL (ref 0.00–0.07)
Basophils Absolute: 0 10*3/uL (ref 0.0–0.1)
Basophils Relative: 0 %
Eosinophils Absolute: 0 10*3/uL (ref 0.0–0.5)
Eosinophils Relative: 0 %
HCT: 36.7 % (ref 36.0–46.0)
Hemoglobin: 11.6 g/dL — ABNORMAL LOW (ref 12.0–15.0)
Immature Granulocytes: 0 %
Lymphocytes Relative: 12 %
Lymphs Abs: 0.9 10*3/uL (ref 0.7–4.0)
MCH: 30.9 pg (ref 26.0–34.0)
MCHC: 31.6 g/dL (ref 30.0–36.0)
MCV: 97.6 fL (ref 80.0–100.0)
Monocytes Absolute: 0.6 10*3/uL (ref 0.1–1.0)
Monocytes Relative: 7 %
Neutro Abs: 6.2 10*3/uL (ref 1.7–7.7)
Neutrophils Relative %: 81 %
Platelets: 303 10*3/uL (ref 150–400)
RBC: 3.76 MIL/uL — ABNORMAL LOW (ref 3.87–5.11)
RDW: 13.3 % (ref 11.5–15.5)
WBC: 7.8 10*3/uL (ref 4.0–10.5)
nRBC: 0 % (ref 0.0–0.2)

## 2021-06-26 LAB — COMPREHENSIVE METABOLIC PANEL
ALT: 22 U/L (ref 0–44)
AST: 28 U/L (ref 15–41)
Albumin: 3.5 g/dL (ref 3.5–5.0)
Alkaline Phosphatase: 99 U/L (ref 38–126)
Anion gap: 11 (ref 5–15)
BUN: 17 mg/dL (ref 8–23)
CO2: 26 mmol/L (ref 22–32)
Calcium: 9.3 mg/dL (ref 8.9–10.3)
Chloride: 100 mmol/L (ref 98–111)
Creatinine, Ser: 1.19 mg/dL — ABNORMAL HIGH (ref 0.44–1.00)
GFR, Estimated: 47 mL/min — ABNORMAL LOW (ref >60)
Glucose, Bld: 163 mg/dL — ABNORMAL HIGH (ref 70–99)
Potassium: 4 mmol/L (ref 3.5–5.1)
Sodium: 137 mmol/L (ref 135–145)
Total Bilirubin: 0.9 mg/dL (ref 0.3–1.2)
Total Protein: 7.4 g/dL (ref 6.5–8.1)

## 2021-06-26 LAB — RESP PANEL BY RT-PCR (FLU A&B, COVID) ARPGX2
Influenza A by PCR: NEGATIVE
Influenza B by PCR: NEGATIVE
SARS Coronavirus 2 by RT PCR: NEGATIVE

## 2021-06-26 LAB — MAGNESIUM: Magnesium: 2.1 mg/dL (ref 1.7–2.4)

## 2021-06-26 MED ORDER — PROMETHAZINE-DM 6.25-15 MG/5ML PO SYRP: 2.5000 mL | ORAL_SOLUTION | Freq: Four times a day (QID) | ORAL | 0 refills | Status: DC | PRN

## 2021-06-26 MED ORDER — ALBUTEROL SULFATE (2.5 MG/3ML) 0.083% IN NEBU
2.5000 mg | INHALATION_SOLUTION | Freq: Once | RESPIRATORY_TRACT | Status: AC
  Administered 2021-06-26: 13:00:00 2.5 mg via RESPIRATORY_TRACT
  Filled 2021-06-26: qty 3

## 2021-06-26 MED ORDER — GUAIFENESIN-CODEINE 100-10 MG/5ML PO SOLN
5.0000 mL | Freq: Once | ORAL | Status: AC
  Administered 2021-06-26: 15:00:00 5 mL via ORAL
  Filled 2021-06-26: qty 5

## 2021-06-26 MED ORDER — PROMETHAZINE-CODEINE 6.25-10 MG/5ML PO SYRP
5.0000 mL | ORAL_SOLUTION | Freq: Once | ORAL | Status: DC
  Filled 2021-06-26: qty 5

## 2021-06-26 MED ORDER — LACTATED RINGERS IV BOLUS
500.0000 mL | Freq: Once | INTRAVENOUS | Status: AC
  Administered 2021-06-26: 16:00:00 500 mL via INTRAVENOUS

## 2021-06-26 MED ORDER — ACETAMINOPHEN 325 MG PO TABS
650.0000 mg | ORAL_TABLET | Freq: Once | ORAL | Status: AC
  Administered 2021-06-26: 15:00:00 650 mg via ORAL
  Filled 2021-06-26: qty 2

## 2021-06-26 MED ORDER — IPRATROPIUM-ALBUTEROL 0.5-2.5 (3) MG/3ML IN SOLN
3.0000 mL | Freq: Once | RESPIRATORY_TRACT | Status: AC
  Administered 2021-06-26: 13:00:00 3 mL via RESPIRATORY_TRACT
  Filled 2021-06-26: qty 3

## 2021-06-26 NOTE — Discharge Instructions (Signed)
Continue steroids and Mucinex.  Continue breathing treatments as needed.  Drink plenty of fluids.  Additionally, a prescription for a cough medicine was sent to [facility].  Take this as needed.  Follow-up with primary care doctor to check the progression of your symptoms.  If you develop worsening severity of symptoms, please return to the emergency department.

## 2021-06-26 NOTE — ED Triage Notes (Addendum)
Hospital admission for the flu ~2 weeks ago, never really got better, c/o cough, sob, chest tightness, violent cough presently. Denies fever, NVD, syncope, hemoptysis. Not getting better despite prednisone, sudafed, mucinex and inhalers. Finished Tamiflu, and finished Prednisone.

## 2021-06-26 NOTE — ED Provider Notes (Signed)
[facility] EMERGENCY DEPARTMENT Provider Note   CSN: [id number] Arrival date & time: 06/26/21  1119     History Chief Complaint  Patient presents with   Shortness of Breath    Cathy Richardson is a 80 y.o. female.   Shortness of Breath Associated symptoms: cough and wheezing   Associated symptoms: no abdominal pain, no chest pain, no ear pain, no fever, no headaches, no neck pain, no rash, no sore throat and no vomiting   Patient presents for shortness of breath.  She was hospitalized for the flu 1 month ago.  She completed course of Tamiflu.  Over the subsequent 3 weeks, she did have improved symptoms and was back to her typical active lifestyle.  Over the past 1 week, she has had cough, shortness of breath, and fatigue.  She does have a history of asthma.  She had a previous prescription for prednisone which she started taking yesterday.  She has been using her home breathing treatments.  She took 1 dose of Mucinex but stopped due to concern of medication interactions.  She has not been taking anything for relief of cough.  She describes the cough as severe at times.  She denies any vomiting or diarrhea.  She has felt fatigue.  Shortness of breath is present with coughing spells, as well as exertion.    Past Medical History:  Diagnosis Date   Anemia    Arthritis    Asthma    Cancer ([place])    History of Renal Cancer   Chronic back pain    Depression    Diabetes mellitus without complication (HCC)    Excessive somnolence disorder    Family history of adverse reaction to anesthesia    son has problems with nausea    GERD (gastroesophageal reflux disease)    Glaucoma    pt denies on preop visit of 01/03/2015    Headache    Heart murmur    High cholesterol    History of hiatal hernia    Hypertension    Osteoporosis    Peptic ulcer    Renal disorder    only one kidney    Sleep apnea    cpap- does not know settings    Urinary tract infection    hx     Patient  Active Problem List   Diagnosis Date Noted   Influenza A 05/29/2021   Acute respiratory failure with hypoxia ([place]) 05/29/2021   OSA on CPAP 05/29/2021   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   Hypertension 11/12/2015   GERD (gastroesophageal reflux disease) 11/12/2015   Diabetes mellitus without complication ([place]) 40/98/1191   Vitamin D deficiency 11/12/2015   High cholesterol 11/12/2015   Depression 11/12/2015   Chronic pain syndrome 11/12/2015   Asthma 11/12/2015   Sublingual gland swelling 01/05/2015   Prolapsed sublingual gland 12/30/2014    Past Surgical History:  Procedure Laterality Date   bowel obstruction surgery      x 2    Carpule Tunnel Release Bilateral    CHOLECYSTECTOMY     JOINT REPLACEMENT     left knee replacement    KNEE ARTHROCENTESIS     left knee   NEPHRECTOMY     PACEMAKER IMPLANT     Removal of Skin Cancer  N/A    Forehead   SKIN SPLIT GRAFT N/A 01/05/2015   Procedure: MANDIBULAR LINGUAL SPLIT THICKNESS SKIN GRAFT VESTIVULOPLASTY;  Surgeon: Michaela Corner, DDS;  Location: [facility];  Service:  Oral Surgery;  Laterality: N/A;     OB History     Gravida  75   Para  7   Term  6   Preterm  1   AB      Living  5      SAB      IAB      Ectopic      Multiple      Live Births              Family History  Problem Relation Age of Onset   Stroke Mother    Diabetes Mother    Hypertension Mother    Stroke Father    Hypertension Father     Social History   Tobacco Use   Smoking status: Never   Smokeless tobacco: Never  Vaping Use   Vaping Use: Never used  Substance Use Topics   Alcohol use: No   Drug use: No    Home Medications Prior to Admission medications   Medication Sig Start Date End Date Taking? Authorizing Provider  promethazine-dextromethorphan (PROMETHAZINE-DM) 6.25-15 MG/5ML syrup Take 2.5 mLs by mouth 4 (four) times daily as needed for cough. 06/26/21  Yes Godfrey Pick, MD  acetaminophen (TYLENOL)  500 MG tablet Take 1,000 mg by mouth every 6 (six) hours as needed.  11/04/15   [provider]  albuterol (PROVENTIL) (2.5 MG/3ML) 0.083% nebulizer solution Take 3 mLs (2.5 mg total) by nebulization every 6 (six) hours as needed for wheezing or shortness of breath. 05/28/21   Delia Heady, PA-C  aspirin EC 81 MG tablet Take 81 mg by mouth daily.     [provider]  atorvastatin (LIPITOR) 20 MG tablet Take 20 mg by mouth at bedtime. 12/21/19   [provider]  calcium carbonate (TUMS - DOSED IN MG ELEMENTAL CALCIUM) 500 MG chewable tablet Chew 2 tablets by mouth every morning.    [provider]  Cholecalciferol 50 MCG (2000 UT) TABS Take 2,000 Units by mouth daily.    [provider]  cyclobenzaprine (FLEXERIL) 10 MG tablet Take 10 mg by mouth daily as needed for muscle spasms. 05/23/13   [provider]  denosumab (PROLIA) 60 MG/ML SOSY injection Inject 60 mg into the skin every 6 (six) months. 06/07/20   [provider]  escitalopram (LEXAPRO) 20 MG tablet Take 20 mg by mouth daily. 12/22/19   [provider]  fentaNYL (DURAGESIC) 25 MCG/HR Place 1 patch onto the skin every 3 (three) days. 04/18/21   [provider]  fluticasone (FLONASE) 50 MCG/ACT nasal spray Place 2 sprays into both nostrils daily.  08/12/15   [provider]  lisinopril (ZESTRIL) 10 MG tablet Take 10 tablets by mouth daily. 08/25/19   [provider]  metFORMIN (GLUCOPHAGE-XR) 500 MG 24 hr tablet Take 500 mg by mouth daily before supper.    [provider]  metoprolol tartrate (LOPRESSOR) 100 MG tablet Take 100 mg by mouth 2 (two) times daily. 11/23/19   [provider]  mometasone-formoterol (DULERA) 200-5 MCG/ACT AERO Inhale 2 puffs into the lungs 2 (two) times daily as needed.  Patient not taking: Reported on 05/30/2021    [provider]  Multiple Vitamin (MULTIVITAMIN) tablet Take 1 tablet by mouth daily.     [provider]  Omega-3 Fatty Acids (FISH OIL) 1000 MG CAPS Take 1,000 mg by mouth daily.    [provider]  pantoprazole (PROTONIX) 40 MG tablet Take 40 mg by mouth.  12/01/14   [provider]  phenylephrine (SUDAFED PE) 10 MG TABS tablet Take 10 mg by mouth every 6 (six) hours as needed (congestion).    [provider]  pramipexole (MIRAPEX) 0.125 MG tablet Take 0.125-0.25 mg by mouth See admin instructions. Taking one or two tablets at bedtime for restless leg 01/26/20   [provider]  pregabalin (LYRICA) 25 MG capsule Take 25 mg by mouth in the morning and at bedtime. 03/16/21   [provider]  silver sulfADIAZINE (SILVADENE) 1 % cream Apply 1 application topically 2 (two) times daily as needed (rash).     [provider]  traMADol (ULTRAM) 50 MG tablet Take 1 tablet by mouth every 8 (eight) hours as needed. 02/22/20   [provider]    Allergies    Gabapentin, Fluoxetine, Iodine-131, Naproxen, Reglan [metoclopramide], Tizanidine, and Ivp dye [iodinated diagnostic agents]  Review of Systems   Review of Systems  Constitutional:  Positive for activity change and fatigue. Negative for appetite change, chills and fever.  HENT:  Positive for congestion and voice change (Hoarseness). Negative for ear pain, sore throat and trouble swallowing.   Eyes:  Negative for pain and visual disturbance.  Respiratory:  Positive for cough, shortness of breath and wheezing. Negative for stridor.   Cardiovascular:  Negative for chest pain, palpitations and leg swelling.  Gastrointestinal:  Negative for abdominal pain, diarrhea, nausea and vomiting.  Genitourinary:  Negative for dysuria, flank pain, hematuria and pelvic pain.  Musculoskeletal:  Negative for arthralgias, back pain, myalgias and neck pain.  Skin:  Negative for color change and rash.  Neurological:  Negative for dizziness, seizures, syncope, weakness, light-headedness,  numbness and headaches.  Psychiatric/Behavioral:  Negative for confusion and decreased concentration.   All other systems reviewed and are negative.  Physical Exam Updated Vital Signs BP (!) 149/67   Pulse 65   Temp 98.3 F (36.8 C) (Oral)   Resp 14   Ht 5\' 4"  (1.626 m)   Wt 97.5 kg   SpO2 95%   BMI 36.90 kg/m   Physical Exam Vitals and nursing note reviewed.  Constitutional:      General: She is not in acute distress.    Appearance: She is well-developed. She is not ill-appearing, toxic-appearing or diaphoretic.  HENT:     Head: Normocephalic and atraumatic.     Mouth/Throat:     Mouth: Mucous membranes are moist.     Pharynx: Oropharynx is clear. No pharyngeal swelling or oropharyngeal exudate.  Eyes:     Extraocular Movements: Extraocular movements intact.     Conjunctiva/sclera: Conjunctivae normal.  Neck:     Vascular: No JVD.  Cardiovascular:     Rate and Rhythm: Normal rate and regular rhythm.     Heart sounds: No murmur heard. Pulmonary:     Effort: Pulmonary effort is normal. No tachypnea, accessory muscle usage or respiratory distress.     Breath sounds: Normal breath sounds. No decreased breath sounds, wheezing, rhonchi or rales.     Comments: Cough present Chest:     Chest wall: No tenderness.  Abdominal:     Palpations: Abdomen is soft.     Tenderness: There is no abdominal tenderness.  Musculoskeletal:        General: No swelling.     Cervical back: Normal range of motion and neck supple.  Skin:    General: Skin is warm and dry.     Capillary Refill: Capillary refill takes less than 2 seconds.  Coloration: Skin is not cyanotic or pale.  Neurological:     General: No focal deficit present.     Mental Status: She is alert and oriented to person, place, and time.     Cranial Nerves: No cranial nerve deficit.     Motor: No weakness.  Psychiatric:        Mood and Affect: Mood normal.        Behavior: Behavior normal.    ED Results / Procedures  / Treatments   Labs (all labs ordered are listed, but only abnormal results are displayed) Labs Reviewed  CBC WITH DIFFERENTIAL/PLATELET - Abnormal; Notable for the following components:      Result Value   RBC 3.76 (*)    Hemoglobin 11.6 (*)    All other components within normal limits  COMPREHENSIVE METABOLIC PANEL - Abnormal; Notable for the following components:   Glucose, Bld 163 (*)    Creatinine, Ser 1.19 (*)    GFR, Estimated 47 (*)    All other components within normal limits  RESP PANEL BY RT-PCR (FLU A&B, COVID) [id number]  MAGNESIUM  URINALYSIS, ROUTINE W REFLEX MICROSCOPIC    EKG EKG Interpretation  Date/Time:  Monday June 26 2021 12:47:01 EST Ventricular Rate:  64 PR Interval:  266 QRS Duration: 84 QT Interval:  416 QTC Calculation: 429 R Axis:   11 Text Interpretation: Atrial-paced rhythm with prolonged AV conduction Abnormal ECG Confirmed by Godfrey Pick [phone]) on 06/26/2021 2:02:48 PM  Radiology DG Chest 2 View  Result Date: 06/26/2021 CLINICAL DATA:  Shortness of breath.  Recently treated for flu. EXAM: CHEST - 2 VIEW COMPARISON:  05/29/2021 FINDINGS: Normal sized heart. Tortuous and partially calcified thoracic aorta. Stable left subclavian bipolar pacemaker leads. Clear lungs. Stable moderate dextroconvex thoracic scoliosis and thoracic spine degenerative changes. Upper abdominal surgical clips. IMPRESSION: No acute abnormality. Electronically Signed   By: Claudie Revering M.D.   On: 06/26/2021 13:32   CT Chest Wo Contrast  Result Date: 06/26/2021 CLINICAL DATA:  Chest pain, cough. EXAM: CT CHEST WITHOUT CONTRAST TECHNIQUE: Multidetector CT imaging of the chest was performed following the standard protocol without IV contrast. COMPARISON:  April 10, 2004. FINDINGS: Cardiovascular: Atherosclerosis of thoracic aorta is noted without aneurysm formation. Normal cardiac size. No pericardial effusion. Left-sided cardiac device in-situ is noted. Mediastinum/Nodes:  No enlarged mediastinal or axillary lymph nodes. Thyroid gland, trachea, and esophagus demonstrate no significant findings. Lungs/Pleura: No pneumothorax or pleural effusion is noted. Minimal bilateral posterior basilar subsegmental atelectasis is noted. Upper Abdomen: No acute abnormality. Musculoskeletal: No chest wall mass or suspicious bone lesions identified. IMPRESSION: Minimal bilateral posterior basilar subsegmental atelectasis. Aortic Atherosclerosis (ICD10-I70.0). Electronically Signed   By: Marijo Conception M.D.   On: 06/26/2021 15:39    Procedures Procedures   Medications Ordered in ED Medications  albuterol (PROVENTIL) (2.5 MG/3ML) 0.083% nebulizer solution 2.5 mg (2.5 mg Nebulization Given 06/26/21 1306)  ipratropium-albuterol (DUONEB) 0.5-2.5 (3) MG/3ML nebulizer solution 3 mL (3 mLs Nebulization Given 06/26/21 1306)  lactated ringers bolus 500 mL (0 mLs Intravenous Stopped 06/26/21 1629)  acetaminophen (TYLENOL) tablet 650 mg (650 mg Oral Given 06/26/21 1525)  guaiFENesin-codeine 100-10 MG/5ML solution 5 mL (5 mLs Oral Given 06/26/21 1526)    ED Course  I have reviewed the triage vital signs and the nursing notes.  Pertinent labs & imaging results that were available during my care of the patient were reviewed by me and considered in my medical decision making (see chart for details).  MDM Rules/Calculators/A&P                          Patient presents for persistent cough.  She also endorses shortness of breath with coughing spells and exertion.  She has been utilizing breathing treatments at home.  Prior to being bedded in the ED, she did receive a breathing treatment in the ED.  On initial assessment, patient's breathing is even and unlabored.  No evidence of wheezes appreciated on lung auscultation.  SPO2 is normal on room air.  Patient is well-appearing.  She does have intermittent coughing spells during ED examination. IV fluids, Tylenol, and cough medicine given in the  ED.  Patient underwent laboratory work-up which showed no leukocytosis and normal electrolytes.  Noncontrasted CT scan of chest showed no evidence of pneumonia.  Patient was advised to continue supportive care at home.  This includes her current Medrol Dosepak, and breathing treatments at home as needed.  She was given prescription for cough medicine, to be taken as needed.  She was encouraged to return if she does experience worsening severity of symptoms.  She was discharged in stable condition.  Final Clinical Impression(s) / ED Diagnoses Final diagnoses:  Bronchitis    Rx / DC Orders ED Discharge Orders          Ordered    promethazine-dextromethorphan (PROMETHAZINE-DM) 6.25-15 MG/5ML syrup  4 times daily PRN        06/26/21 1616             Godfrey Pick, MD 06/27/21 [phone]

## 2021-08-21 DIAGNOSIS — G8929 Other chronic pain: Secondary | ICD-10-CM | POA: Insufficient documentation

## 2021-12-08 ENCOUNTER — Other Ambulatory Visit (HOSPITAL_COMMUNITY): Payer: Self-pay

## 2022-03-12 ENCOUNTER — Emergency Department (HOSPITAL_BASED_OUTPATIENT_CLINIC_OR_DEPARTMENT_OTHER)
Admission: EM | Admit: 2022-03-12 | Discharge: 2022-03-12 | Disposition: A | Discharge status: Home / Self Care | Payer: Medicare Other | Attending: Emergency Medicine | Admitting: Emergency Medicine

## 2022-03-12 ENCOUNTER — Encounter (HOSPITAL_BASED_OUTPATIENT_CLINIC_OR_DEPARTMENT_OTHER): Payer: Self-pay

## 2022-03-12 ENCOUNTER — Emergency Department (HOSPITAL_BASED_OUTPATIENT_CLINIC_OR_DEPARTMENT_OTHER): Payer: Medicare Other

## 2022-03-12 DIAGNOSIS — Z7982 Long term (current) use of aspirin: Secondary | ICD-10-CM | POA: Diagnosis not present

## 2022-03-12 DIAGNOSIS — R0602 Shortness of breath: Secondary | ICD-10-CM | POA: Diagnosis present

## 2022-03-12 DIAGNOSIS — J45909 Unspecified asthma, uncomplicated: Secondary | ICD-10-CM | POA: Diagnosis not present

## 2022-03-12 DIAGNOSIS — Z7951 Long term (current) use of inhaled steroids: Secondary | ICD-10-CM | POA: Diagnosis not present

## 2022-03-12 DIAGNOSIS — U071 COVID-19: Secondary | ICD-10-CM | POA: Insufficient documentation

## 2022-03-12 LAB — BASIC METABOLIC PANEL
Anion gap: 5 (ref 5–15)
BUN: 17 mg/dL (ref 8–23)
CO2: 29 mmol/L (ref 22–32)
Calcium: 9.1 mg/dL (ref 8.9–10.3)
Chloride: 104 mmol/L (ref 98–111)
Creatinine, Ser: 1.35 mg/dL — ABNORMAL HIGH (ref 0.44–1.00)
GFR, Estimated: 40 mL/min — ABNORMAL LOW (ref >60)
Glucose, Bld: 109 mg/dL — ABNORMAL HIGH (ref 70–99)
Potassium: 5.1 mmol/L (ref 3.5–5.1)
Sodium: 138 mmol/L (ref 135–145)

## 2022-03-12 LAB — TROPONIN I (HIGH SENSITIVITY): Troponin I (High Sensitivity): 3 ng/L (ref <18)

## 2022-03-12 LAB — CBC
HCT: 36.8 % (ref 36.0–46.0)
Hemoglobin: 11.6 g/dL — ABNORMAL LOW (ref 12.0–15.0)
MCH: 30.2 pg (ref 26.0–34.0)
MCHC: 31.5 g/dL (ref 30.0–36.0)
MCV: 95.8 fL (ref 80.0–100.0)
Platelets: 230 10*3/uL (ref 150–400)
RBC: 3.84 MIL/uL — ABNORMAL LOW (ref 3.87–5.11)
RDW: 13.8 % (ref 11.5–15.5)
WBC: 3.7 10*3/uL — ABNORMAL LOW (ref 4.0–10.5)
nRBC: 0 % (ref 0.0–0.2)

## 2022-03-12 LAB — SARS CORONAVIRUS 2 BY RT PCR: SARS Coronavirus 2 by RT PCR: POSITIVE — AB

## 2022-03-12 NOTE — ED Notes (Signed)
ED Provider at bedside. 

## 2022-03-12 NOTE — ED Notes (Signed)
RT assessed patient in triage. BBS clear and mildly diminished in left base. SAT 95%. Stated she took a neb at home, but did not feel any less SOB. EKG in progress

## 2022-03-12 NOTE — Discharge Instructions (Signed)
Use Mucinex as needed for congestion.  You can continue to use your nebulizer machines.  You can take your cough syrup as needed.  Follow-up with your doctor if your symptoms are not improving within the next week.  Return to the emergency room if you have any worsening symptoms such as worsening shortness of breath, vomiting, confusion or other worsening symptoms.

## 2022-03-12 NOTE — ED Triage Notes (Signed)
C/o shortness of breath since Wednesday with dry cough, fatigue.

## 2022-03-12 NOTE — ED Provider Notes (Signed)
[facility] EMERGENCY DEPARTMENT Provider Note   CSN: [id number] Arrival date & time: 03/12/22  1146     History  Chief Complaint  Patient presents with   Shortness of Breath    Cathy Richardson is a 81 y.o. female.  Patient is a 81 year old female who presents with cough and chest congestion.  She says it started about 7 days ago.  Her cough is mostly nonproductive.  No fevers.  She had some nausea but no vomiting.  No increased leg swelling.  She does have a history of asthma and has been using her nebulizer machines because she feels tight across her chest.       Home Medications Prior to Admission medications   Medication Sig Start Date End Date Taking? Authorizing Provider  acetaminophen (TYLENOL) 500 MG tablet Take 1,000 mg by mouth every 6 (six) hours as needed.  11/04/15   [provider]  albuterol (PROVENTIL) (2.5 MG/3ML) 0.083% nebulizer solution Take 3 mLs (2.5 mg total) by nebulization every 6 (six) hours as needed for wheezing or shortness of breath. 05/28/21   Delia Heady, PA-C  aspirin EC 81 MG tablet Take 81 mg by mouth daily.     [provider]  atorvastatin (LIPITOR) 20 MG tablet Take 20 mg by mouth at bedtime. 12/21/19   [provider]  calcium carbonate (TUMS - DOSED IN MG ELEMENTAL CALCIUM) 500 MG chewable tablet Chew 2 tablets by mouth every morning.    [provider]  Cholecalciferol 50 MCG (2000 UT) TABS Take 2,000 Units by mouth daily.    [provider]  cyclobenzaprine (FLEXERIL) 10 MG tablet Take 10 mg by mouth daily as needed for muscle spasms. 05/23/13   [provider]  denosumab (PROLIA) 60 MG/ML SOSY injection Inject 60 mg into the skin every 6 (six) months. 06/07/20   [provider]  escitalopram (LEXAPRO) 20 MG tablet Take 20 mg by mouth daily. 12/22/19   [provider]  fentaNYL (DURAGESIC) 25 MCG/HR Place 1 patch onto the skin every 3 (three) days. 04/18/21    [provider]  fluticasone (FLONASE) 50 MCG/ACT nasal spray Place 2 sprays into both nostrils daily.  08/12/15   [provider]  lisinopril (ZESTRIL) 10 MG tablet Take 10 tablets by mouth daily. 08/25/19   [provider]  metFORMIN (GLUCOPHAGE-XR) 500 MG 24 hr tablet Take 500 mg by mouth daily before supper.    [provider]  metoprolol tartrate (LOPRESSOR) 100 MG tablet Take 100 mg by mouth 2 (two) times daily. 11/23/19   [provider]  mometasone-formoterol (DULERA) 200-5 MCG/ACT AERO Inhale 2 puffs into the lungs 2 (two) times daily as needed.  Patient not taking: Reported on 05/30/2021    [provider]  Multiple Vitamin (MULTIVITAMIN) tablet Take 1 tablet by mouth daily.    [provider]  Omega-3 Fatty Acids (FISH OIL) 1000 MG CAPS Take 1,000 mg by mouth daily.    [provider]  pantoprazole (PROTONIX) 40 MG tablet Take 40 mg by mouth. 12/01/14   [provider]  phenylephrine (SUDAFED PE) 10 MG TABS tablet Take 10 mg by mouth every 6 (six) hours as needed (congestion).    [provider]  pramipexole (MIRAPEX) 0.125 MG tablet Take 0.125-0.25 mg by mouth See admin instructions. Taking one or two tablets at bedtime for restless leg 01/26/20   [provider]  pregabalin (LYRICA) 25 MG capsule Take 25 mg by mouth in  the morning and at bedtime. 03/16/21   [provider]  promethazine-dextromethorphan (PROMETHAZINE-DM) 6.25-15 MG/5ML syrup Take 2.5 mLs by mouth 4 (four) times daily as needed for cough. 06/26/21   Godfrey Pick, MD  silver sulfADIAZINE (SILVADENE) 1 % cream Apply 1 application topically 2 (two) times daily as needed (rash).     [provider]  traMADol (ULTRAM) 50 MG tablet Take 1 tablet by mouth every 8 (eight) hours as needed. 02/22/20   [provider]      Allergies    Gabapentin, Fluoxetine, Iodine-131, Naproxen, Reglan [metoclopramide],  Tizanidine, and Ivp dye [iodinated contrast media]    Review of Systems   Review of Systems  Constitutional:  Positive for fatigue. Negative for chills, diaphoresis and fever.  HENT:  Negative for congestion, rhinorrhea and sneezing.   Eyes: Negative.   Respiratory:  Positive for cough and shortness of breath. Negative for chest tightness.   Cardiovascular:  Negative for chest pain and leg swelling.  Gastrointestinal:  Positive for nausea. Negative for abdominal pain, blood in stool, diarrhea and vomiting.  Genitourinary:  Negative for difficulty urinating, flank pain, frequency and hematuria.  Musculoskeletal:  Negative for arthralgias and back pain.  Skin:  Negative for rash.  Neurological:  Negative for dizziness, speech difficulty, weakness, numbness and headaches.    Physical Exam Updated Vital Signs BP (!) 130/55   Pulse 91   Temp 97.8 F (36.6 C) (Oral)   Resp 15   Ht '5\' 4"'$  (1.626 m)   Wt 99.3 kg   SpO2 99%   BMI 37.59 kg/m  Physical Exam Constitutional:      Appearance: She is well-developed.  HENT:     Head: Normocephalic and atraumatic.  Eyes:     Pupils: Pupils are equal, round, and reactive to light.  Cardiovascular:     Rate and Rhythm: Normal rate and regular rhythm.     Heart sounds: Normal heart sounds.  Pulmonary:     Effort: Pulmonary effort is normal. No respiratory distress.     Breath sounds: Normal breath sounds. No wheezing or rales.  Chest:     Chest wall: No tenderness.  Abdominal:     General: Bowel sounds are normal.     Palpations: Abdomen is soft.     Tenderness: There is no abdominal tenderness. There is no guarding or rebound.  Musculoskeletal:        General: Normal range of motion.     Cervical back: Normal range of motion and neck supple.     Comments: Trace edema bilaterally, no calf tenderness  Lymphadenopathy:     Cervical: No cervical adenopathy.  Skin:    General: Skin is warm and dry.     Findings: No rash.  Neurological:      Mental Status: She is alert and oriented to person, place, and time.     ED Results / Procedures / Treatments   Labs (all labs ordered are listed, but only abnormal results are displayed) Labs Reviewed  SARS CORONAVIRUS 2 BY RT PCR - Abnormal; Notable for the following components:      Result Value   SARS Coronavirus 2 by RT PCR POSITIVE (*)    All other components within normal limits  BASIC METABOLIC PANEL - Abnormal; Notable for the following components:   Glucose, Bld 109 (*)    Creatinine, Ser 1.35 (*)    GFR, Estimated 40 (*)    All other components within normal limits  CBC - Abnormal;  Notable for the following components:   WBC 3.7 (*)    RBC 3.84 (*)    Hemoglobin 11.6 (*)    All other components within normal limits  TROPONIN I (HIGH SENSITIVITY)  TROPONIN I (HIGH SENSITIVITY)    EKG None  Radiology DG Chest 2 View  Result Date: 03/12/2022 CLINICAL DATA:  Shortness of breath and chest tightness EXAM: CHEST - 2 VIEW COMPARISON:  06/26/2021 FINDINGS: The patient is rotated to the left on today's radiograph, reducing diagnostic sensitivity and specificity. Dextroconvex thoracic scoliosis. Dual lead pacer remains in place. Atherosclerotic calcification of the aortic arch. Mildly reverse lordotic projection. The lungs appear clear. No blunting of the costophrenic angles. Degenerative glenohumeral spurring on the right. Upper abdominal retroperitoneal clips noted. Thoracic spondylosis and mild thoracic kyphosis. IMPRESSION: 1.  No acute cardiopulmonary findings. 2. Dual lead pacer noted. 3.  Aortic Atherosclerosis (ICD10-I70.0). 4. Dextroconvex thoracic scoliosis. Electronically Signed   By: Van Clines M.D.   On: 03/12/2022 12:31    Procedures Procedures    Medications Ordered in ED Medications - No data to display  ED Course/ Medical Decision Making/ A&P                           Medical Decision Making Amount and/or Complexity of Data Reviewed Labs:  ordered. Radiology: ordered.   Patient is a 81 year old female who presents with cough and chest congestion as well as fatigue.  Her lungs are clear.  She had a chest x-ray that showed no evidence of pneumonia or pulmonary edema.  This was interpreted by me and confirmed by radiology.  Labs are reviewed and are nonconcerning.  Her creatinine is mildly elevated but similar to prior values.  She does not have any symptoms that are more concerning for ACS or PE.  She has normal oxygen saturations at 99% on room air.  Her COVID test is positive.  At this point she is well-appearing and does not need inpatient hospitalization.  She is out of the window for oral antiviral treatment.  Symptomatic care instructions were given.  Return precautions were given.  Final Clinical Impression(s) / ED Diagnoses Final diagnoses:  COVID-19 virus infection    Rx / DC Orders ED Discharge Orders     None         Malvin Johns, MD 03/12/22 1709

## 2022-04-03 ENCOUNTER — Emergency Department (HOSPITAL_COMMUNITY): Payer: Medicare Other

## 2022-04-03 ENCOUNTER — Encounter (HOSPITAL_COMMUNITY): Payer: Self-pay

## 2022-04-03 ENCOUNTER — Emergency Department (HOSPITAL_COMMUNITY)
Admission: EM | Admit: 2022-04-03 | Discharge: 2022-04-03 | Disposition: A | Discharge status: Home / Self Care | Payer: Medicare Other | Attending: Emergency Medicine | Admitting: Emergency Medicine

## 2022-04-03 ENCOUNTER — Other Ambulatory Visit: Payer: Self-pay

## 2022-04-03 DIAGNOSIS — I10 Essential (primary) hypertension: Secondary | ICD-10-CM | POA: Insufficient documentation

## 2022-04-03 DIAGNOSIS — Z7982 Long term (current) use of aspirin: Secondary | ICD-10-CM | POA: Insufficient documentation

## 2022-04-03 DIAGNOSIS — Z85038 Personal history of other malignant neoplasm of large intestine: Secondary | ICD-10-CM | POA: Diagnosis not present

## 2022-04-03 DIAGNOSIS — E119 Type 2 diabetes mellitus without complications: Secondary | ICD-10-CM | POA: Insufficient documentation

## 2022-04-03 DIAGNOSIS — Z7984 Long term (current) use of oral hypoglycemic drugs: Secondary | ICD-10-CM | POA: Diagnosis not present

## 2022-04-03 DIAGNOSIS — Z79899 Other long term (current) drug therapy: Secondary | ICD-10-CM | POA: Insufficient documentation

## 2022-04-03 DIAGNOSIS — R109 Unspecified abdominal pain: Secondary | ICD-10-CM | POA: Diagnosis present

## 2022-04-03 DIAGNOSIS — R1030 Lower abdominal pain, unspecified: Secondary | ICD-10-CM

## 2022-04-03 DIAGNOSIS — I158 Other secondary hypertension: Secondary | ICD-10-CM | POA: Insufficient documentation

## 2022-04-03 LAB — COMPREHENSIVE METABOLIC PANEL
ALT: 6 U/L (ref 0–44)
AST: 36 U/L (ref 15–41)
Albumin: 3.3 g/dL — ABNORMAL LOW (ref 3.5–5.0)
Alkaline Phosphatase: 94 U/L (ref 38–126)
Anion gap: 6 (ref 5–15)
BUN: 13 mg/dL (ref 8–23)
CO2: 24 mmol/L (ref 22–32)
Calcium: 9.1 mg/dL (ref 8.9–10.3)
Chloride: 110 mmol/L (ref 98–111)
Creatinine, Ser: 1.26 mg/dL — ABNORMAL HIGH (ref 0.44–1.00)
GFR, Estimated: 43 mL/min — ABNORMAL LOW (ref >60)
Glucose, Bld: 100 mg/dL — ABNORMAL HIGH (ref 70–99)
Potassium: 5.3 mmol/L — ABNORMAL HIGH (ref 3.5–5.1)
Sodium: 140 mmol/L (ref 135–145)
Total Bilirubin: 2.1 mg/dL — ABNORMAL HIGH (ref 0.3–1.2)
Total Protein: 6.3 g/dL — ABNORMAL LOW (ref 6.5–8.1)

## 2022-04-03 LAB — URINALYSIS, ROUTINE W REFLEX MICROSCOPIC
Bilirubin Urine: NEGATIVE
Glucose, UA: NEGATIVE mg/dL
Hgb urine dipstick: NEGATIVE
Ketones, ur: NEGATIVE mg/dL
Leukocytes,Ua: NEGATIVE
Nitrite: NEGATIVE
Protein, ur: NEGATIVE mg/dL
Specific Gravity, Urine: 1.017 (ref 1.005–1.030)
pH: 5 (ref 5.0–8.0)

## 2022-04-03 LAB — CBC
HCT: 38.4 % (ref 36.0–46.0)
Hemoglobin: 12.1 g/dL (ref 12.0–15.0)
MCH: 30.4 pg (ref 26.0–34.0)
MCHC: 31.5 g/dL (ref 30.0–36.0)
MCV: 96.5 fL (ref 80.0–100.0)
Platelets: 254 10*3/uL (ref 150–400)
RBC: 3.98 MIL/uL (ref 3.87–5.11)
RDW: 13.3 % (ref 11.5–15.5)
WBC: 3.8 10*3/uL — ABNORMAL LOW (ref 4.0–10.5)
nRBC: 0 % (ref 0.0–0.2)

## 2022-04-03 LAB — TROPONIN I (HIGH SENSITIVITY): Troponin I (High Sensitivity): 3 ng/L (ref <18)

## 2022-04-03 LAB — LIPASE, BLOOD: Lipase: 44 U/L (ref 11–51)

## 2022-04-03 NOTE — ED Provider Notes (Signed)
  Physical Exam  BP (!) 151/80   Pulse 62   Temp 98.3 F (36.8 C) (Oral)   Resp 12   Ht '5\' 4"'$  (1.626 m)   Wt 101.6 kg   SpO2 93%   BMI 38.45 kg/m   Physical Exam Vitals and nursing note reviewed.  Constitutional:      General: She is not in acute distress.    Appearance: She is obese. She is not ill-appearing or toxic-appearing.  HENT:     Head: Normocephalic and atraumatic.  Cardiovascular:     Rate and Rhythm: Normal rate and regular rhythm.     Heart sounds: Normal heart sounds.  Pulmonary:     Effort: Pulmonary effort is normal. No respiratory distress.  Abdominal:     General: Abdomen is protuberant. There is no distension.     Palpations: Abdomen is soft.     Tenderness: There is no abdominal tenderness.  Skin:    General: Skin is warm and dry.  Neurological:     Mental Status: She is oriented to person, place, and time.  Psychiatric:        Mood and Affect: Mood normal.        Behavior: Behavior normal.     Procedures  Procedures  ED Course / MDM    Medical Decision Making Amount and/or Complexity of Data Reviewed Labs: ordered. Radiology: ordered.   Patient signed out to me at shift change.  Please see previous provider note for further details.  In short, this is a 81 year old female with PMHx of colon cancer (2017) DM T2, HTN, hyperlipidemia, GERD, PUD presenting with sudden onset left-sided abdominal pain at 10 AM this morning.  Resolved after 1 hour.  Also with 1 small twinge of chest pain earlier, resolved without intervention.  No symptoms since arrival to the ED.  Abdominal CT negative.  Troponin pending.  Anticipate likely discharge if troponins normal.  Workup pertinent findings: --Troponin: 3, consistent with baseline over last 1-2 months  Plan -- Troponin 3, do not believe secondary troponin necessary.  Recommend close follow up with PCP/Cardiology.  Pt asymptomatic and hemodynamically stable.  In NAD and good condition at time of discharge.     This chart was dictated using voice recognition software.  Despite best efforts to proofread, errors can occur which can change the documentation meaning.        Prince Rome, PA-C 70/01/74 1226    Varney Biles, MD 04/05/22 1521

## 2022-04-03 NOTE — ED Provider Notes (Signed)
[facility] DEPT Provider Note   CSN: [id number] Arrival date & time: 04/03/22  1146     History  Chief Complaint  Patient presents with   Abdominal Pain    Cathy Richardson is a 81 y.o. female.  81 year old female with past medical history of colon cancer (2017), diabetes, hypertension, hyperlipidemia peptic ulcer, GERD, hiatal hernia presents with complaint of sudden onset left-sided abdominal pain onset 10 AM this morning and lasted about 1 hour before resolving.  Denies associated changes in bowel or bladder habits, states that her stools are generally watery.  Denies associated nausea, vomiting with fevers, chills.  States that she did have a twinge of chest pain however this was brief and resolved without intervention.  Patient had not taken her morning medications when this pain hit, states that she did take her regular medications including her Zofran, unsure if this caused her pain to resolve or not.       Home Medications Prior to Admission medications   Medication Sig Start Date End Date Taking? Authorizing Provider  acetaminophen (TYLENOL) 500 MG tablet Take 1,000 mg by mouth every 6 (six) hours as needed.  11/04/15   [provider]  albuterol (PROVENTIL) (2.5 MG/3ML) 0.083% nebulizer solution Take 3 mLs (2.5 mg total) by nebulization every 6 (six) hours as needed for wheezing or shortness of breath. 05/28/21   Delia Heady, PA-C  aspirin EC 81 MG tablet Take 81 mg by mouth daily.     [provider]  atorvastatin (LIPITOR) 20 MG tablet Take 20 mg by mouth at bedtime. 12/21/19   [provider]  calcium carbonate (TUMS - DOSED IN MG ELEMENTAL CALCIUM) 500 MG chewable tablet Chew 2 tablets by mouth every morning.    [provider]  Cholecalciferol 50 MCG (2000 UT) TABS Take 2,000 Units by mouth daily.    [provider]  cyclobenzaprine (FLEXERIL) 10 MG tablet Take 10 mg by mouth daily as needed for  muscle spasms. 05/23/13   [provider]  denosumab (PROLIA) 60 MG/ML SOSY injection Inject 60 mg into the skin every 6 (six) months. 06/07/20   [provider]  escitalopram (LEXAPRO) 20 MG tablet Take 20 mg by mouth daily. 12/22/19   [provider]  fentaNYL (DURAGESIC) 25 MCG/HR Place 1 patch onto the skin every 3 (three) days. 04/18/21   [provider]  fluticasone (FLONASE) 50 MCG/ACT nasal spray Place 2 sprays into both nostrils daily.  08/12/15   [provider]  lisinopril (ZESTRIL) 10 MG tablet Take 10 tablets by mouth daily. 08/25/19   [provider]  metFORMIN (GLUCOPHAGE-XR) 500 MG 24 hr tablet Take 500 mg by mouth daily before supper.    [provider]  metoprolol tartrate (LOPRESSOR) 100 MG tablet Take 100 mg by mouth 2 (two) times daily. 11/23/19   [provider]  mometasone-formoterol (DULERA) 200-5 MCG/ACT AERO Inhale 2 puffs into the lungs 2 (two) times daily as needed.  Patient not taking: Reported on 05/30/2021    [provider]  Multiple Vitamin (MULTIVITAMIN) tablet Take 1 tablet by mouth daily.    [provider]  Omega-3 Fatty Acids (FISH OIL) 1000 MG CAPS Take 1,000 mg by mouth daily.    [provider]  pantoprazole (PROTONIX) 40 MG tablet Take 40 mg by mouth. 12/01/14   [provider]  phenylephrine (SUDAFED PE) 10 MG TABS tablet Take 10 mg by mouth every 6 (six) hours as needed (  congestion).    [provider]  pramipexole (MIRAPEX) 0.125 MG tablet Take 0.125-0.25 mg by mouth See admin instructions. Taking one or two tablets at bedtime for restless leg 01/26/20   [provider]  pregabalin (LYRICA) 25 MG capsule Take 25 mg by mouth in the morning and at bedtime. 03/16/21   [provider]  promethazine-dextromethorphan (PROMETHAZINE-DM) 6.25-15 MG/5ML syrup Take 2.5 mLs by mouth 4 (four) times daily as needed for cough. 06/26/21   Godfrey Pick, MD  silver sulfADIAZINE (SILVADENE) 1 % cream Apply 1 application topically 2 (two) times daily as needed (rash).     [provider]  traMADol (ULTRAM) 50 MG tablet Take 1 tablet by mouth every 8 (eight) hours as needed. 02/22/20   [provider]      Allergies    Gabapentin, Fluoxetine, Iodine-131, Naproxen, Reglan [metoclopramide], Tizanidine, and Ivp dye [iodinated contrast media]    Review of Systems   Review of Systems Negative except as per HPI Physical Exam Updated Vital Signs BP 125/67   Pulse 61   Temp 98.3 F (36.8 C)   Resp 17   Ht '5\' 4"'$  (1.626 m)   Wt 101.6 kg   SpO2 95%   BMI 38.45 kg/m  Physical Exam Vitals and nursing note reviewed.  Constitutional:      General: She is not in acute distress.    Appearance: She is well-developed. She is not diaphoretic.  HENT:     Head: Normocephalic and atraumatic.  Cardiovascular:     Rate and Rhythm: Normal rate and regular rhythm.     Pulses: Normal pulses.     Heart sounds: Normal heart sounds.  Pulmonary:     Effort: Pulmonary effort is normal.     Breath sounds: Normal breath sounds.  Abdominal:     Palpations: Abdomen is soft.     Tenderness: There is abdominal tenderness in the left lower quadrant.  Musculoskeletal:     Right lower leg: No edema.     Left lower leg: No edema.  Skin:    General: Skin is warm and dry.     Findings: No erythema or rash.  Neurological:     Mental Status: She is alert and oriented to person, place, and time.  Psychiatric:        Behavior: Behavior normal.     ED Results / Procedures / Treatments   Labs (all labs ordered are listed, but only abnormal results are displayed) Labs Reviewed  COMPREHENSIVE METABOLIC PANEL - Abnormal; Notable for the following components:      Result Value   Potassium 5.3 (*)    Glucose, Bld 100 (*)    Creatinine, Ser 1.26 (*)    Total Protein 6.3 (*)    Albumin 3.3 (*)    Total Bilirubin 2.1 (*)    GFR, Estimated  43 (*)    All other components within normal limits  CBC - Abnormal; Notable for the following components:   WBC 3.8 (*)    All other components within normal limits  LIPASE, BLOOD  URINALYSIS, ROUTINE W REFLEX MICROSCOPIC  TROPONIN I (HIGH SENSITIVITY)    EKG None  Radiology DG Chest Port 1 View  Result Date: 04/03/2022 CLINICAL DATA:  Abdominal pain EXAM: PORTABLE CHEST 1 VIEW COMPARISON:  Previous studies including the examination done on 03/12/2022 FINDINGS: Transverse diameter of heart is difficult to evaluate due to significant dextroscoliosis in thoracic spine. There are no signs of pulmonary edema or focal  pulmonary consolidation. There is no pleural effusion or pneumothorax. Pacemaker battery is seen in the left infraclavicular region. Surgical clips are seen in upper abdomen. IMPRESSION: There are no signs of pulmonary edema or focal pulmonary consolidation. Electronically Signed   By: Elmer Picker M.D.   On: 04/03/2022 14:47   CT Abdomen Pelvis Wo Contrast  Result Date: 04/03/2022 CLINICAL DATA:  Left lower quadrant pain EXAM: CT ABDOMEN AND PELVIS WITHOUT CONTRAST TECHNIQUE: Multidetector CT imaging of the abdomen and pelvis was performed following the standard protocol without IV contrast. RADIATION DOSE REDUCTION: This exam was performed according to the departmental dose-optimization program which includes automated exposure control, adjustment of the mA and/or kV according to patient size and/or use of iterative reconstruction technique. COMPARISON:  01/24/2022 and previous FINDINGS: Lower chest: Transvenous pacing leads partially visualized. Scattered coronary calcifications. No pleural or pericardial effusion. Subpleural scarring or atelectasis posteriorly in both lung bases. Hepatobiliary: No focal liver abnormality is seen. Status post cholecystectomy. No biliary dilatation. Pancreas: Unremarkable. No pancreatic ductal dilatation or surrounding inflammatory changes.  Spleen: Normal in size without focal abnormality. Adrenals/Urinary Tract: Post left nephrectomy. No adrenal mass. Right kidney unremarkable. No urolithiasis or hydronephrosis. Urinary bladder is nondistended. Stomach/Bowel: Stomach and small bowel are nondistended. Previous right hemicolectomy. The remaining colon is incompletely distended, unremarkable. Vascular/Lymphatic: Moderate calcified aortoiliac atheromatous plaque without aneurysm. No abdominal or pelvic adenopathy. Reproductive: Uterus and bilateral adnexa are unremarkable. Other: No free air. No ascites. Postop changes in the anterior abdominal wall. Musculoskeletal: Decrease in fluid distending the right iliopsoas bursa. Bilateral hip DJD. Osteitis pubis. Bilateral sacroiliitis. Grade 1/2 anterolisthesis L5-S1 with fusion across the interspace as before. Multilevel spondylitic changes in the lower thoracic and lumbar spine. No acute findings. IMPRESSION: 1. No acute findings. 2. Coronary and Aortic Atherosclerosis (ICD10-170.0). 3. Degenerative and postop changes as above. Electronically Signed   By: Lucrezia Europe M.D.   On: 04/03/2022 14:18    Procedures Procedures    Medications Ordered in ED Medications - No data to display  ED Course/ Medical Decision Making/ A&P                           Medical Decision Making Amount and/or Complexity of Data Reviewed Labs: ordered. Radiology: ordered.   This patient presents to the ED for concern of abdominal pain and chest discomfort, this involves an extensive number of treatment options, and is a complaint that carries with it a high risk of complications and morbidity.  The differential diagnosis includes but not limited to colitis, diverticulitis, SBO, mass, arrhythmia, ACS   Co morbidities that complicate the patient evaluation  Colon cancer, diabetes, hypertension, hyperlipidemia, peptic ulcer, GERD, hiatal hernia   Additional history obtained:  Additional history obtained from  family at bedside who assist with history as above External records from outside source obtained and reviewed including prior labs for comparison   Lab Tests:  I Ordered, and personally interpreted labs.  The pertinent results include: Urinalysis is unremarkable.  Lipase normal.  CBC without significant findings.  Troponin negative.  CMP with creatinine 1.26, not significantly changed from prior.  Total bilirubin is elevated at 2.1, patient is postcholecystectomy.   Imaging Studies ordered:  I ordered imaging studies including chest x-ray, CT abdomen pelvis without contrast I independently visualized and interpreted imaging which showed no acute abnormality I agree with the radiologist interpretation   Cardiac Monitoring: / EKG:  The patient was maintained on a cardiac  monitor.  I personally viewed and interpreted the cardiac monitored which showed an underlying rhythm of: Sinus rhythm, rate 75   Problem List / ED Course / Critical interventions / Medication management  81 year old female with complaint of left lower abdominal pain which resolved prior to arrival. Does have slight discomfort with palpation, CT unremarkable. Labs without notable findings. Patient experienced chest discomfort earlier, EKG strip with EMS abnormal/nonspecific, cardiac work up pending at time of sign out.  I have reviewed the patients home medicines and have made adjustments as needed   Social Determinants of Health:  Lives with family, has local care team   Test / Admission - Considered:  Disposition at time of sign out pending troponin and pacemaker interrogation         Final Clinical Impression(s) / ED Diagnoses Final diagnoses:  Lower abdominal pain  Other secondary hypertension  Diabetes mellitus without complication [facility])    Rx / DC Orders ED Discharge Orders     None         Tacy Learn, PA-C 04/04/22 3361    Milton Ferguson, MD 04/06/22 1001

## 2022-04-03 NOTE — ED Provider Triage Note (Signed)
Emergency Medicine Provider Triage Evaluation Note  Cathy Richardson , a 81 y.o. female  was evaluated in triage.  Pt complains of sudden onset severe generalized abdominal pain at 10am, unprovoked.  Lasted about 1 hour then resolved.  No N/V, constipation, headache, dizziness, chest pain, or shortness of breath.  No recent fevers.  Hx of colon cancer with multiple associated surgeries.  Brought in with EKG strip, which appeared suspicious for possible acute cardiac etiology.  Pacemaker placed 02/05/2018.  Review of Systems  Positive:  Negative: See above  Physical Exam  BP 127/83   Pulse 65   Temp 98.3 F (36.8 C) (Oral)   Resp 12   Ht '5\' 4"'$  (1.626 m)   Wt 101.6 kg   SpO2 91%   BMI 38.45 kg/m  Gen:   Awake, no distress   Resp:  Normal effort  MSK:   Moves extremities without difficulty  Other:  Mild abdominal tenderness, abdomen soft.  Medical Decision Making  Medically screening exam initiated at 12:09 PM.  Appropriate orders placed.  Cathy Richardson was informed that the remainder of the evaluation will be completed by another provider, this initial triage assessment does not replace that evaluation, and the importance of remaining in the ED until their evaluation is complete.    Prince Rome, PA-C 27/06/23 1212

## 2022-04-03 NOTE — Discharge Instructions (Addendum)
The imaging of your abdomen and pelvis was nonconcerning today.  Please follow-up with your PCP within the next 2-3 days for reevaluation and continued medical management.  Your EKGs and labs today were overall were nonconcerning.  However, I would still recommend following up with your cardiologist within the next 2-3 days for reevaluation as well.    Cardiology - [facility] [place], [place]   [address]   [place], [place] [address]   [phone]    Return to the ED for any new or worsening symptoms as discussed.

## 2022-04-03 NOTE — ED Triage Notes (Signed)
Pt BIB EMS from home. Pt endorses severe lower abdominal pain upon waking at 10am. Pain has lessened since them. Pt has fentanyl patch on arm that she placed yesterday. Hx of colon cancer. Pt has pacemaker.

## 2022-05-02 DIAGNOSIS — F1199 Opioid use, unspecified with unspecified opioid-induced disorder: Secondary | ICD-10-CM | POA: Insufficient documentation

## 2022-10-01 DIAGNOSIS — I495 Sick sinus syndrome: Secondary | ICD-10-CM | POA: Insufficient documentation

## 2023-01-08 DIAGNOSIS — M4810 Ankylosing hyperostosis [Forestier], site unspecified: Secondary | ICD-10-CM | POA: Insufficient documentation

## 2023-04-11 ENCOUNTER — Emergency Department (HOSPITAL_BASED_OUTPATIENT_CLINIC_OR_DEPARTMENT_OTHER)
Admission: EM | Admit: 2023-04-11 | Discharge: 2023-04-11 | Disposition: A | Discharge status: Home / Self Care | Payer: Medicare Other | Attending: Emergency Medicine | Admitting: Emergency Medicine

## 2023-04-11 ENCOUNTER — Emergency Department (HOSPITAL_BASED_OUTPATIENT_CLINIC_OR_DEPARTMENT_OTHER): Payer: Medicare Other

## 2023-04-11 ENCOUNTER — Other Ambulatory Visit: Payer: Self-pay

## 2023-04-11 ENCOUNTER — Encounter (HOSPITAL_BASED_OUTPATIENT_CLINIC_OR_DEPARTMENT_OTHER): Payer: Self-pay | Admitting: Pediatrics

## 2023-04-11 DIAGNOSIS — Z7982 Long term (current) use of aspirin: Secondary | ICD-10-CM | POA: Insufficient documentation

## 2023-04-11 DIAGNOSIS — J45909 Unspecified asthma, uncomplicated: Secondary | ICD-10-CM | POA: Insufficient documentation

## 2023-04-11 DIAGNOSIS — E875 Hyperkalemia: Secondary | ICD-10-CM | POA: Diagnosis not present

## 2023-04-11 DIAGNOSIS — Z1152 Encounter for screening for COVID-19: Secondary | ICD-10-CM | POA: Diagnosis not present

## 2023-04-11 DIAGNOSIS — E860 Dehydration: Secondary | ICD-10-CM | POA: Insufficient documentation

## 2023-04-11 DIAGNOSIS — Z79899 Other long term (current) drug therapy: Secondary | ICD-10-CM | POA: Diagnosis not present

## 2023-04-11 DIAGNOSIS — E119 Type 2 diabetes mellitus without complications: Secondary | ICD-10-CM | POA: Insufficient documentation

## 2023-04-11 DIAGNOSIS — I10 Essential (primary) hypertension: Secondary | ICD-10-CM | POA: Insufficient documentation

## 2023-04-11 DIAGNOSIS — N179 Acute kidney failure, unspecified: Secondary | ICD-10-CM | POA: Insufficient documentation

## 2023-04-11 DIAGNOSIS — Z85528 Personal history of other malignant neoplasm of kidney: Secondary | ICD-10-CM | POA: Diagnosis not present

## 2023-04-11 DIAGNOSIS — Z7984 Long term (current) use of oral hypoglycemic drugs: Secondary | ICD-10-CM | POA: Insufficient documentation

## 2023-04-11 LAB — URINALYSIS, ROUTINE W REFLEX MICROSCOPIC
Bilirubin Urine: NEGATIVE
Glucose, UA: NEGATIVE mg/dL
Hgb urine dipstick: NEGATIVE
Ketones, ur: NEGATIVE mg/dL
Nitrite: NEGATIVE
Protein, ur: NEGATIVE mg/dL
Specific Gravity, Urine: 1.02 (ref 1.005–1.030)
pH: 5 (ref 5.0–8.0)

## 2023-04-11 LAB — CBC WITH DIFFERENTIAL/PLATELET
Abs Immature Granulocytes: 0 10*3/uL (ref 0.00–0.07)
Basophils Absolute: 0 10*3/uL (ref 0.0–0.1)
Basophils Relative: 1 %
Eosinophils Absolute: 0.1 10*3/uL (ref 0.0–0.5)
Eosinophils Relative: 3 %
HCT: 34.3 % — ABNORMAL LOW (ref 36.0–46.0)
Hemoglobin: 10.7 g/dL — ABNORMAL LOW (ref 12.0–15.0)
Immature Granulocytes: 0 %
Lymphocytes Relative: 43 %
Lymphs Abs: 1.6 10*3/uL (ref 0.7–4.0)
MCH: 30.4 pg (ref 26.0–34.0)
MCHC: 31.2 g/dL (ref 30.0–36.0)
MCV: 97.4 fL (ref 80.0–100.0)
Monocytes Absolute: 0.3 10*3/uL (ref 0.1–1.0)
Monocytes Relative: 9 %
Neutro Abs: 1.7 10*3/uL (ref 1.7–7.7)
Neutrophils Relative %: 44 %
Platelets: 211 10*3/uL (ref 150–400)
RBC: 3.52 MIL/uL — ABNORMAL LOW (ref 3.87–5.11)
RDW: 13.4 % (ref 11.5–15.5)
WBC: 3.7 10*3/uL — ABNORMAL LOW (ref 4.0–10.5)
nRBC: 0 % (ref 0.0–0.2)

## 2023-04-11 LAB — COMPREHENSIVE METABOLIC PANEL
ALT: 17 U/L (ref 0–44)
AST: 18 U/L (ref 15–41)
Albumin: 3.2 g/dL — ABNORMAL LOW (ref 3.5–5.0)
Alkaline Phosphatase: 83 U/L (ref 38–126)
Anion gap: 6 (ref 5–15)
BUN: 32 mg/dL — ABNORMAL HIGH (ref 8–23)
CO2: 29 mmol/L (ref 22–32)
Calcium: 9 mg/dL (ref 8.9–10.3)
Chloride: 101 mmol/L (ref 98–111)
Creatinine, Ser: 1.71 mg/dL — ABNORMAL HIGH (ref 0.44–1.00)
GFR, Estimated: 30 mL/min — ABNORMAL LOW (ref >60)
Glucose, Bld: 109 mg/dL — ABNORMAL HIGH (ref 70–99)
Potassium: 5.3 mmol/L — ABNORMAL HIGH (ref 3.5–5.1)
Sodium: 136 mmol/L (ref 135–145)
Total Bilirubin: 1.1 mg/dL (ref 0.3–1.2)
Total Protein: 6.1 g/dL — ABNORMAL LOW (ref 6.5–8.1)

## 2023-04-11 LAB — RESP PANEL BY RT-PCR (RSV, FLU A&B, COVID)  RVPGX2
Influenza A by PCR: NEGATIVE
Influenza B by PCR: NEGATIVE
Resp Syncytial Virus by PCR: NEGATIVE
SARS Coronavirus 2 by RT PCR: NEGATIVE

## 2023-04-11 LAB — URINALYSIS, MICROSCOPIC (REFLEX)

## 2023-04-11 LAB — MAGNESIUM: Magnesium: 2.3 mg/dL (ref 1.7–2.4)

## 2023-04-11 MED ORDER — SODIUM CHLORIDE 0.9 % IV BOLUS
1000.0000 mL | Freq: Once | INTRAVENOUS | Status: AC
  Administered 2023-04-11: 1000 mL via INTRAVENOUS

## 2023-04-11 MED ORDER — SODIUM CHLORIDE 0.9 % IV BOLUS
500.0000 mL | Freq: Once | INTRAVENOUS | Status: AC
  Administered 2023-04-11: 500 mL via INTRAVENOUS

## 2023-04-11 MED ORDER — SODIUM ZIRCONIUM CYCLOSILICATE 10 G PO PACK
10.0000 g | PACK | Freq: Once | ORAL | Status: AC
  Administered 2023-04-11: 10 g via ORAL
  Filled 2023-04-11: qty 1

## 2023-04-11 NOTE — ED Triage Notes (Signed)
Reported was told by PCP concerns for dehydration due to low appetite and decreased oral intake. Denies pain when asked.

## 2023-04-11 NOTE — Discharge Instructions (Addendum)
Lab work today shows dehydration, mildly elevated potassium.  Please see your PCP in the next 5-7 days for repeat labs  Drink plenty of fluids over the next few days  It was a pleasure caring for you today in the emergency department.  Please return to the emergency department for any worsening or worrisome symptoms.

## 2023-04-11 NOTE — ED Provider Notes (Signed)
[facility] Provider Note  CSN: [id number] Arrival date & time: 04/11/23 1720  Chief Complaint(s) Dehydration  HPI Cathy Richardson is a 82 y.o. 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  She reports she has been eating poorly over the past 6 months or so, poor appetite, reduced p.o. intake.  Thinks she may be losing weight but unsure.  She has no chest pain, dyspnea, no abd pain pain nausea or vomiting, she has chronic diarrhea unchanged, no BRBPR or melena.  No recent medication or diet changes.  No fevers, chills, nausea or vomiting.P.o. intake.  Reports that she had labs drawn at her PCPs office yesterday and was told to come here concern for dehydration.    Past Medical History Past Medical History:  Diagnosis Date   Anemia    Arthritis    Asthma    Cancer (HCC)    History of Renal Cancer   Chronic back pain    Depression    Diabetes mellitus without complication (HCC)    Excessive somnolence disorder    Family history of adverse reaction to anesthesia    son has problems with nausea    GERD (gastroesophageal reflux disease)    Glaucoma    pt denies on preop visit of 01/03/2015    Headache    Heart murmur    High cholesterol    History of hiatal hernia    Hypertension    Osteoporosis    Peptic ulcer    Renal disorder    only one kidney    Sleep apnea    cpap- does not know settings    Urinary tract infection    hx    Patient Active Problem List   Diagnosis Date Noted   Influenza A 05/29/2021   Acute respiratory failure with hypoxia (HCC) 05/29/2021   OSA on CPAP 05/29/2021   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 Authorizing Provider  acetaminophen (TYLENOL) 500 MG tablet Take 1,000 mg by mouth every 6 (six) hours as needed.  11/04/15   [provider]  albuterol (PROVENTIL) (2.5 MG/3ML) 0.083% nebulizer solution Take 3 mLs (2.5 mg total) by nebulization every 6 (six) hours as needed for wheezing or shortness of breath. 05/28/21   Dietrich Pates, PA-C  aspirin EC 81 MG tablet Take 81 mg by mouth daily.     [provider]  atorvastatin (LIPITOR) 20 MG tablet Take 20 mg by mouth at bedtime. 12/21/19   [provider]  calcium carbonate (TUMS - DOSED IN MG ELEMENTAL CALCIUM) 500 MG chewable tablet Chew 2 tablets by mouth every morning.    [provider]  Cholecalciferol 50 MCG (2000 UT) TABS Take 2,000 Units by mouth daily.    [provider]  cyclobenzaprine (FLEXERIL) 10 MG tablet Take 10 mg by mouth  daily as needed for muscle spasms. 05/23/13   [provider]  denosumab (PROLIA) 60 MG/ML SOSY injection Inject 60 mg into the skin every 6 (six) months. 06/07/20   [provider]  escitalopram (LEXAPRO) 20 MG tablet Take 20 mg by mouth daily. 12/22/19   [provider]  fentaNYL (DURAGESIC) 25 MCG/HR Place 1 patch onto the skin every 3 (three) days. 04/18/21   [provider]  fluticasone (FLONASE) 50 MCG/ACT nasal spray Place 2 sprays into both nostrils daily.  08/12/15   [provider]  lisinopril (ZESTRIL) 10 MG tablet Take 10 tablets by mouth daily. 08/25/19   [provider]  metFORMIN (GLUCOPHAGE-XR) 500 MG 24 hr tablet Take  500 mg by mouth daily before supper.    [provider]  metoprolol tartrate (LOPRESSOR) 100 MG tablet Take 100 mg by mouth 2 (two) times daily. 11/23/19   [provider]  mometasone-formoterol (DULERA) 200-5 MCG/ACT AERO Inhale 2 puffs into the lungs 2 (two) times daily as needed.  Patient not taking: Reported on 05/30/2021    [provider]  Multiple Vitamin (MULTIVITAMIN) tablet Take 1 tablet by mouth daily.    [provider]  Omega-3 Fatty Acids (FISH OIL) 1000 MG CAPS Take 1,000 mg by mouth daily.    [provider]  pantoprazole (PROTONIX) 40 MG tablet Take 40 mg by mouth. 12/01/14   [provider]  phenylephrine (SUDAFED PE) 10 MG TABS tablet Take 10 mg by mouth every 6 (six) hours as needed (congestion).    [provider]  pramipexole (MIRAPEX) 0.125 MG tablet Take 0.125-0.25 mg by mouth See admin instructions. Taking one or two tablets at bedtime for restless leg 01/26/20   [provider]  pregabalin (LYRICA) 25 MG capsule Take 25 mg by mouth in the morning and at bedtime. 03/16/21   [provider]  promethazine-dextromethorphan (PROMETHAZINE-DM) 6.25-15 MG/5ML syrup Take 2.5 mLs by mouth 4 (four) times daily as needed for cough. 06/26/21   Gloris Manchester, MD  silver sulfADIAZINE (SILVADENE) 1 % cream Apply 1 application topically 2 (two) times daily as needed (rash).     [provider]  traMADol (ULTRAM) 50 MG tablet Take 1 tablet by mouth every 8 (eight) hours as needed. 02/22/20   [provider]                                                                                                                                    Past Surgical History Past Surgical History:  Procedure Laterality Date   bowel obstruction surgery      x 2    Carpule Tunnel Release Bilateral    CHOLECYSTECTOMY     JOINT REPLACEMENT     left knee replacement    KNEE ARTHROCENTESIS     left knee   NEPHRECTOMY      PACEMAKER IMPLANT     Removal of  Skin Cancer  N/A    Forehead   SKIN SPLIT GRAFT N/A 01/05/2015   Procedure: MANDIBULAR LINGUAL SPLIT THICKNESS SKIN GRAFT VESTIVULOPLASTY;  Surgeon: Ubaldo Glassing, DDS;  Location: [facility];  Service: Oral Surgery;  Laterality: N/A;   Family History Family History  Problem Relation Age of Onset   Stroke Mother    Diabetes Mother    Hypertension Mother    Stroke Father    Hypertension Father     Social History Social History   Tobacco Use   Smoking status: Never   Smokeless tobacco: Never  Vaping Use   Vaping status: Never Used  Substance Use Topics   Alcohol use: No   Drug use: No   Allergies Gabapentin, Fluoxetine, Iodine-131, Naproxen, Reglan [metoclopramide], Tizanidine, and Ivp dye [iodinated contrast media]  Review of Systems Review of Systems  Constitutional:  Positive for appetite change and fatigue.  Eyes:  Negative for redness and visual disturbance.  Respiratory:  Negative for chest tightness and shortness of breath.   Cardiovascular:  Negative for chest pain and palpitations.  Gastrointestinal:  Negative for abdominal pain, nausea and vomiting.  Genitourinary:  Negative for decreased urine volume and difficulty urinating.  Neurological:  Negative for syncope and numbness.    Physical Exam Vital Signs  I have reviewed the triage vital signs BP (!) 141/56   Pulse 64   Temp 97.7 F (36.5 C) (Oral)   Resp 16   Ht 5\' 4"  (1.626 m)   Wt 97.1 kg   SpO2 95%   BMI 36.73 kg/m  Physical Exam Vitals and nursing note reviewed.  Constitutional:      General: She is not in acute distress.    Appearance: Normal appearance.  HENT:     Head: Normocephalic and atraumatic.     Right Ear: External ear normal.     Left Ear: External ear normal.     Nose: Nose normal.     Mouth/Throat:     Mouth: Mucous membranes are dry.  Eyes:     General: No scleral icterus.       Right eye: No discharge.        Left eye: No discharge.   Cardiovascular:     Rate and Rhythm: Normal rate and regular rhythm.     Pulses: Normal pulses.     Heart sounds: Normal heart sounds.  Pulmonary:     Effort: Pulmonary effort is normal. No respiratory distress.     Breath sounds: Normal breath sounds. No stridor.  Abdominal:     General: Abdomen is flat. There is no distension.     Palpations: Abdomen is soft.     Tenderness: There is no abdominal tenderness.  Musculoskeletal:     Cervical back: No rigidity.     Right lower leg: No edema.     Left lower leg: No edema.  Skin:    General: Skin is warm and dry.     Capillary Refill: Capillary refill takes less than 2 seconds.  Neurological:     Mental Status: She is alert.  Psychiatric:        Mood and Affect: Mood normal.        Behavior: Behavior normal. Behavior is cooperative.     ED Results and Treatments Labs (all labs ordered are listed, but only abnormal results are displayed) Labs Reviewed  CBC WITH DIFFERENTIAL/PLATELET - Abnormal; Notable for the following components:      Result Value   WBC 3.7 (*)  RBC 3.52 (*)    Hemoglobin 10.7 (*)    HCT 34.3 (*)    All other components within normal limits  COMPREHENSIVE METABOLIC PANEL - Abnormal; Notable for the following components:   Potassium 5.3 (*)    Glucose, Bld 109 (*)    BUN 32 (*)    Creatinine, Ser 1.71 (*)    Total Protein 6.1 (*)    Albumin 3.2 (*)    GFR, Estimated 30 (*)    All other components within normal limits  URINALYSIS, ROUTINE W REFLEX MICROSCOPIC - Abnormal; Notable for the following components:   Leukocytes,Ua TRACE (*)    All other components within normal limits  URINALYSIS, MICROSCOPIC (REFLEX) - Abnormal; Notable for the following components:   Bacteria, UA RARE (*)    All other components within normal limits  RESP PANEL BY RT-PCR (RSV, FLU A&B, COVID)  RVPGX2  MAGNESIUM  TSH  T4, FREE                                                                                                                           Radiology DG Chest Portable 1 View  Result Date: 04/11/2023 CLINICAL DATA:  Dehydration EXAM: PORTABLE CHEST 1 VIEW COMPARISON:  CT chest dated 01/24/2022 FINDINGS: Lungs are clear.  No pleural effusion or pneumothorax. Cardiomegaly.  Left subclavian pacemaker. Atherosclerotic calcifications of the aortic arch. IMPRESSION: Cardiomegaly. Otherwise, no acute cardiopulmonary disease. Electronically Signed   By: Charline Bills M.D.   On: 04/11/2023 20:34    Pertinent labs & imaging results that were available during my care of the patient were reviewed by me and considered in my medical decision making (see MDM for details).  Medications Ordered in ED Medications  sodium chloride 0.9 % bolus 1,000 mL (0 mLs Intravenous Stopped 04/11/23 2126)  sodium zirconium cyclosilicate (LOKELMA) packet 10 g (10 g Oral Given 04/11/23 1959)  sodium chloride 0.9 % bolus 500 mL (0 mLs Intravenous Stopped 04/11/23 2313)                                                                                                                                     Procedures .Critical Care  Performed by: Sloan Leiter, DO Authorized by: Sloan Leiter, DO   Critical care provider statement:    Critical care time (minutes):  32   Critical care was necessary to treat  or prevent imminent or life-threatening deterioration of the following conditions:  Dehydration   Critical care was time spent personally by me on the following activities:  Development of treatment plan with patient or surrogate, discussions with consultants, evaluation of patient's response to treatment, examination of patient, ordering and review of laboratory studies, ordering and review of radiographic studies, ordering and performing treatments and interventions, pulse oximetry, re-evaluation of patient's condition, review of old charts and obtaining history from patient or surrogate   (including critical care time)  Medical  Decision Making / ED Course    Medical Decision Making:    LEANZA HUTCHCRAFT is a 82 y.o. 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 The complaint involves an extensive differential diagnosis and also carries with it a high risk of complications and morbidity.  Serious etiology was considered. Ddx includes but is not limited to: Metabolic derangement, endocrine disturbance, infectious, dehydration, etc.  Complete initial physical exam performed, notably the patient  was no acute distress, sitting upright, she has no complaints.    Reviewed and confirmed nursing documentation for past medical history, family history, social history.  Vital signs reviewed.    Clinical Course as of 04/11/23 2315  Thu Apr 11, 2023  1947 Creatinine(!): 1.71 Mildly worsened from prior [SG]  1947 Potassium(!): 5.3 Similar value 1 year ago, EKG stable, give fluids and Lokelma [SG]  1948 Hemoglobin(!): 10.7 Similar to prior [SG]  1948 + BUN and creatinine, she reports poor p.o. intake.  She appears clinically dry on exam.  Favor prerenal source of creatinine elevation/AKI.  Give IV fluids [SG]    Clinical Course User Index [SG] Sloan Leiter, DO     Patient has no complaints.  Reports she has had poor appetite for many months now, poor p.o. intake.  She has no pain, no dyspnea, no nausea or vomiting.  Will check screening labs  Labs show dehydration, AKI.  Mild hyperkalemia.  Given Lokelma, IV fluids.  Feeling much better.  Tolerant p.o. without difficulty.  Reasonable to continue outpatient rehydration and follow-up PCP for repeat labs in the next few days.  No nausea or vomiting, no significant belly pain, no change in urination or bowel function from baseline.  The patient improved significantly and was discharged in stable condition. Detailed discussions were  had with the patient regarding current findings, and need for close f/u with PCP or on call doctor. The patient has been instructed to return immediately if the symptoms worsen in any way for re-evaluation. Patient verbalized understanding and is in agreement with current care plan. All questions answered prior to discharge.                 Additional history obtained: -Additional history obtained from family -External records from outside source obtained and reviewed including: Chart review including previous notes, labs, imaging, consultation notes including  Prior labs and imaging, home medications   Lab Tests: -I ordered, reviewed, and interpreted labs.   The pertinent results include:   Labs Reviewed  CBC WITH DIFFERENTIAL/PLATELET - Abnormal; Notable for the following components:      Result Value   WBC 3.7 (*)    RBC 3.52 (*)    Hemoglobin 10.7 (*)    HCT 34.3 (*)    All other components within normal limits  COMPREHENSIVE METABOLIC PANEL - Abnormal; Notable  for the following components:   Potassium 5.3 (*)    Glucose, Bld 109 (*)    BUN 32 (*)    Creatinine, Ser 1.71 (*)    Total Protein 6.1 (*)    Albumin 3.2 (*)    GFR, Estimated 30 (*)    All other components within normal limits  URINALYSIS, ROUTINE W REFLEX MICROSCOPIC - Abnormal; Notable for the following components:   Leukocytes,Ua TRACE (*)    All other components within normal limits  URINALYSIS, MICROSCOPIC (REFLEX) - Abnormal; Notable for the following components:   Bacteria, UA RARE (*)    All other components within normal limits  RESP PANEL BY RT-PCR (RSV, FLU A&B, COVID)  RVPGX2  MAGNESIUM  TSH  T4, FREE    Notable for AKI, mild hyperkalemia  EKG   EKG Interpretation Date/Time:  Thursday April 11 2023 19:22:34 EDT Ventricular Rate:  61 PR Interval:  185 QRS Duration:  94 QT Interval:  421 QTC Calculation: 424 R Axis:   21  Text Interpretation: Sinus or ectopic atrial rhythm  Abnormal R-wave progression, early transition Confirmed by Tanda Rockers ([id number]) on 04/11/2023 8:21:41 PM         Imaging Studies ordered: I ordered imaging studies including chest x-ray I independently visualized the following imaging with scope of interpretation limited to determining acute life threatening conditions related to emergency care; findings noted above, significant for cardiomegaly, no acute changes  I independently visualized and interpreted imaging. I agree with the radiologist interpretation   Medicines ordered and prescription drug management: Meds ordered this encounter  Medications   sodium chloride 0.9 % bolus 1,000 mL   sodium zirconium cyclosilicate (LOKELMA) packet 10 g   sodium chloride 0.9 % bolus 500 mL    -I have reviewed the patients home medicines and have made adjustments as needed   Consultations Obtained: na   Cardiac Monitoring: Continuous pulse oximetry interpreted by myself, 99% on RA.    Social Determinants of Health:  Diagnosis or treatment significantly limited by social determinants of health: na  Reevaluation: After the interventions noted above, I reevaluated the patient and found that they have improved  Co morbidities that complicate the patient evaluation  Past Medical History:  Diagnosis Date   Anemia    Arthritis    Asthma    Cancer (HCC)    History of Renal Cancer   Chronic back pain    Depression    Diabetes mellitus without complication (HCC)    Excessive somnolence disorder    Family history of adverse reaction to anesthesia    son has problems with nausea    GERD (gastroesophageal reflux disease)    Glaucoma    pt denies on preop visit of 01/03/2015    Headache    Heart murmur    High cholesterol    History of hiatal hernia    Hypertension    Osteoporosis    Peptic ulcer    Renal disorder    only one kidney    Sleep apnea    cpap- does not know settings    Urinary tract infection    hx        Dispostion: Disposition decision including need for hospitalization was considered, and patient discharged from emergency department.    Final Clinical Impression(s) / ED Diagnoses Final diagnoses:  Hyperkalemia  AKI (acute kidney injury) (HCC)  Dehydration        Sloan Leiter, DO 04/11/23 2315

## 2023-04-12 LAB — TSH: TSH: 1.731 u[IU]/mL (ref 0.350–4.500)

## 2023-04-12 LAB — T4, FREE: Free T4: 0.87 ng/dL (ref 0.61–1.12)

## 2023-06-03 DIAGNOSIS — R110 Nausea: Secondary | ICD-10-CM | POA: Insufficient documentation

## 2023-11-21 ENCOUNTER — Encounter: Payer: Self-pay | Admitting: Family Medicine

## 2023-11-21 ENCOUNTER — Ambulatory Visit: Admitting: Family Medicine

## 2023-11-21 VITALS — BP 110/60 | HR 70 | Temp 97.9°F | Ht 64.0 in | Wt 208.0 lb

## 2023-11-21 DIAGNOSIS — M2010 Hallux valgus (acquired), unspecified foot: Secondary | ICD-10-CM | POA: Insufficient documentation

## 2023-11-21 DIAGNOSIS — M810 Age-related osteoporosis without current pathological fracture: Secondary | ICD-10-CM

## 2023-11-21 DIAGNOSIS — G8929 Other chronic pain: Secondary | ICD-10-CM

## 2023-11-21 DIAGNOSIS — E782 Mixed hyperlipidemia: Secondary | ICD-10-CM

## 2023-11-21 DIAGNOSIS — L304 Erythema intertrigo: Secondary | ICD-10-CM

## 2023-11-21 DIAGNOSIS — Z7984 Long term (current) use of oral hypoglycemic drugs: Secondary | ICD-10-CM

## 2023-11-21 DIAGNOSIS — N184 Chronic kidney disease, stage 4 (severe): Secondary | ICD-10-CM | POA: Diagnosis not present

## 2023-11-21 DIAGNOSIS — M545 Low back pain, unspecified: Secondary | ICD-10-CM | POA: Diagnosis not present

## 2023-11-21 DIAGNOSIS — G894 Chronic pain syndrome: Secondary | ICD-10-CM

## 2023-11-21 DIAGNOSIS — E1122 Type 2 diabetes mellitus with diabetic chronic kidney disease: Secondary | ICD-10-CM | POA: Diagnosis not present

## 2023-11-21 DIAGNOSIS — M2040 Other hammer toe(s) (acquired), unspecified foot: Secondary | ICD-10-CM | POA: Insufficient documentation

## 2023-11-21 DIAGNOSIS — I10 Essential (primary) hypertension: Secondary | ICD-10-CM

## 2023-11-21 LAB — BASIC METABOLIC PANEL WITH GFR
BUN: 27 mg/dL — ABNORMAL HIGH (ref 6–23)
CO2: 30 meq/L (ref 19–32)
Calcium: 9.6 mg/dL (ref 8.4–10.5)
Chloride: 102 meq/L (ref 96–112)
Creatinine, Ser: 2.01 mg/dL — ABNORMAL HIGH (ref 0.40–1.20)
GFR: 22.76 mL/min — ABNORMAL LOW (ref >60.00)
Glucose, Bld: 108 mg/dL — ABNORMAL HIGH (ref 70–99)
Potassium: 4.6 meq/L (ref 3.5–5.1)
Sodium: 137 meq/L (ref 135–145)

## 2023-11-21 LAB — URINALYSIS, ROUTINE W REFLEX MICROSCOPIC
Bilirubin Urine: NEGATIVE
Hgb urine dipstick: NEGATIVE
Ketones, ur: NEGATIVE
Leukocytes,Ua: NEGATIVE
Nitrite: NEGATIVE
RBC / HPF: NONE SEEN (ref 0–?)
Specific Gravity, Urine: 1.025 (ref 1.000–1.030)
Total Protein, Urine: NEGATIVE
Urine Glucose: NEGATIVE
Urobilinogen, UA: 0.2 (ref 0.0–1.0)
WBC, UA: NONE SEEN (ref 0–?)
pH: 6 (ref 5.0–8.0)

## 2023-11-21 LAB — MICROALBUMIN / CREATININE URINE RATIO
Creatinine,U: 187.8 mg/dL
Microalb Creat Ratio: 8.9 mg/g (ref 0.0–30.0)
Microalb, Ur: 1.7 mg/dL (ref 0.0–1.9)

## 2023-11-21 LAB — LIPID PANEL
Cholesterol: 112 mg/dL (ref 0–200)
HDL: 38.6 mg/dL — ABNORMAL LOW (ref >39.00)
LDL Cholesterol: 20 mg/dL (ref 0–99)
NonHDL: 73.23
Total CHOL/HDL Ratio: 3
Triglycerides: 266 mg/dL — ABNORMAL HIGH (ref 0.0–149.0)
VLDL: 53.2 mg/dL — ABNORMAL HIGH (ref 0.0–40.0)

## 2023-11-21 LAB — HEMOGLOBIN A1C: Hgb A1c MFr Bld: 6.2 % (ref 4.6–6.5)

## 2023-11-21 MED ORDER — SILVER SULFADIAZINE 1 % EX CREA
1.0000 | TOPICAL_CREAM | Freq: Two times a day (BID) | CUTANEOUS | 5 refills | Status: DC | PRN
Start: 2023-11-21 — End: 2024-05-22

## 2023-11-21 NOTE — Assessment & Plan Note (Signed)
 On chronic opioid therapy as directed by Dr. Brain Cahill.

## 2023-11-21 NOTE — Assessment & Plan Note (Signed)
 I will check her lipids. Continue atorvastatin  20 mg daily.

## 2023-11-21 NOTE — Assessment & Plan Note (Signed)
-  As above.

## 2023-11-21 NOTE — Assessment & Plan Note (Signed)
 I will check annual DM labs today. Continue metformin  500 mg daily.

## 2023-11-21 NOTE — Assessment & Plan Note (Signed)
 I will renew her Silvadene  for PRN use.

## 2023-11-21 NOTE — Progress Notes (Signed)
 [facility] [address] [place] [address] Dept: [phone] Dept Fax: [phone]  New Patient Office Visit  Subjective:    Patient ID: Cathy [place], female    DOB: 05/04/1941, 83 y.o..   MRN: [id number]  Chief Complaint  Patient presents with   Establish Care    NP- establish care.     History of Present Illness:  Patient is in today to establish care. Cathy [place] was born in [place], [place]. She grew up in [place]. 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 Cathy [place] was married for 18 years. She divorced and her ex-husband has since died. She has 7 children (1 died in infancy, the other in his 87s from colon cancer), ages ranging 2-62. She has 20+ grandchildren, multiple great grandchildren, and three great-great grandchildren. She denies use of tobacco or alcohol .  Cathy [place] has a history of hyperlipidemia. She is managed on atorvastatin  20 mg daily.  Cathy [place] has osteoporosis. She had been receiving Prolia injections. Her last injection was about a year ago. She also takes a daily calcium  and Vitamin D  supplements.  Cathy [place] has a history of depression. She is managed on escitalopram  20 mg daily.  Cathy [place] has a history of chronic low back pain due to degenerative arthritis. She is managed by Dr. Brain Cahill on fentanyl  25 mcg/hr patches and uses PRN tramadol or Vicodin for breakthrough pains.  Cathy [place] has a history of hypertension. She is managed on lisinopril 10 mg daily.  Cathy [place] has a history of Type 2 diabetes. She is managed on metformin  500 mg daily.  Cathy [place] has a history of a sick sinus syndrome. She has a pacemaker. she is also on metoprolol tartrate 100 mg bid.  Cathy [place] has a history of GERD. She is managed on pantoprazole  40 mg daily.  Cathy [place] has a history of pramipexole  (Mirapex ) 0.125 mg at  bedtime.  Cathy [place] notes she periodically has raw areas that develop within the skin folds of her abdomen. she uses Silvadene  cream as needed.  Past Medical History: Patient Active Problem List   Diagnosis Date Noted   Hammertoe 11/21/2023   Hallux valgus 11/21/2023   Intertrigo 11/21/2023   Chronic nausea 06/03/2023   DISH (diffuse idiopathic skeletal hyperostosis) 01/08/2023   SSS (sick sinus syndrome) (HCC) 10/01/2022   Opioid use, unspecified with unspecified opioid-induced disorder (HCC) 05/02/2022   Chronic right shoulder pain 08/21/2021   OSA on CPAP 05/29/2021   Solitary kidney, acquired 07/18/2020   Stage 3b chronic kidney disease (HCC) 07/18/2020   Chronic fatigue 11/26/2019   Major depression in remission (HCC) 01/29/2019   Mixed hyperlipidemia 09/05/2018   RLS (restless legs syndrome) 09/05/2018   Class 2 severe obesity due to excess calories with serious comorbidity and body mass index (BMI) of 38.0 to 38.9 in adult [facility]) 09/05/2018   Type 2 diabetes mellitus with diabetic chronic kidney disease (HCC) 09/05/2018   Chronic bilateral low back pain without sciatica 05/26/2018   History of colon cancer, stage I 12/13/2015   Adenocarcinoma of transverse colon (HCC) 11/12/2015   GERD (gastroesophageal reflux disease) 11/12/2015   Vitamin D  deficiency 11/12/2015   Chronic pain syndrome 11/12/2015   Asthma 11/12/2015   Essential hypertension 06/17/2013   Personal history of renal cancer 06/17/2013   OA (osteoarthritis) 06/17/2013   Mixed  incontinence 06/17/2013   Lumbar spondylosis 06/17/2013   Age-related osteoporosis without current pathological fracture 06/17/2013   Past Surgical History:  Procedure Laterality Date   bowel obstruction surgery      x 2    Carpule Tunnel Release Bilateral    CHOLECYSTECTOMY     KNEE ARTHROCENTESIS     left knee   NEPHRECTOMY     PACEMAKER IMPLANT     Removal of Skin Cancer  N/A    Forehead   SKIN SPLIT GRAFT N/A 01/05/2015    Procedure: MANDIBULAR LINGUAL SPLIT THICKNESS SKIN GRAFT VESTIVULOPLASTY;  Surgeon: Pieter Bride, DDS;  Location: [facility];  Service: Oral Surgery;  Laterality: N/A;   TOTAL KNEE ARTHROPLASTY Left    left knee replacement    Family History  Problem Relation Age of Onset   Stroke Mother    Diabetes Mother    Hypertension Mother    Stroke Father    Hypertension Father    Diabetes Daughter    Cancer Daughter        Cervical   Cancer Son        Colon   Cancer Son        Colon   Outpatient Medications Prior to Visit  Medication Sig Dispense Refill   albuterol  (PROVENTIL ) (2.5 MG/3ML) 0.083% nebulizer solution Take 3 mLs (2.5 mg total) by nebulization every 6 (six) hours as needed for wheezing or shortness of breath. 75 mL 12   aspirin  EC 81 MG tablet Take 81 mg by mouth daily.      atorvastatin  (LIPITOR) 20 MG tablet Take 20 mg by mouth at bedtime.     calcium  carbonate (TUMS - DOSED IN MG ELEMENTAL CALCIUM ) 500 MG chewable tablet Chew 2 tablets by mouth every morning.     Cholecalciferol  50 MCG (2000 UT) TABS Take 2,000 Units by mouth daily.     cyclobenzaprine  (FLEXERIL ) 10 MG tablet Take 10 mg by mouth daily as needed for muscle spasms.     denosumab (PROLIA) 60 MG/ML SOSY injection Inject 60 mg into the skin every 6 (six) months.     escitalopram  (LEXAPRO ) 20 MG tablet Take 20 mg by mouth daily.     fentaNYL  (DURAGESIC ) 25 MCG/HR Place 1 patch onto the skin every 3 (three) days.     fluticasone  (FLONASE ) 50 MCG/ACT nasal spray Place 2 sprays into both nostrils daily.      HYDROcodone-acetaminophen  (NORCO/VICODIN) 5-325 MG tablet Take 1 tablet by mouth every 12 (twelve) hours as needed.     lisinopril (ZESTRIL) 10 MG tablet Take 10 tablets by mouth daily.     metFORMIN  (GLUCOPHAGE -XR) 500 MG 24 hr tablet Take 500 mg by mouth daily before supper.     metoprolol tartrate (LOPRESSOR) 100 MG tablet Take 100 mg by mouth 2 (two) times daily.     mometasone -formoterol  (DULERA ) 200-5  MCG/ACT AERO Inhale 2 puffs into the lungs 2 (two) times daily as needed.     Multiple Vitamin (MULTIVITAMIN) tablet Take 1 tablet by mouth daily.     Omega-3 Fatty Acids (FISH OIL) 1000 MG CAPS Take 1,000 mg by mouth daily.     pantoprazole  (PROTONIX ) 40 MG tablet Take 40 mg by mouth.     phenylephrine (SUDAFED PE) 10 MG TABS tablet Take 10 mg by mouth every 6 (six) hours as needed (congestion).     pramipexole  (MIRAPEX ) 0.125 MG tablet Take 0.125-0.25 mg by mouth See admin instructions. Taking one or two tablets at bedtime  for restless leg     pregabalin  (LYRICA ) 25 MG capsule Take 25 mg by mouth in the morning and at bedtime.     traMADol (ULTRAM) 50 MG tablet Take 1 tablet by mouth every 8 (eight) hours as needed.     silver  sulfADIAZINE  (SILVADENE ) 1 % cream Apply 1 application topically 2 (two) times daily as needed (rash).      latanoprost (XALATAN) 0.005 % ophthalmic solution 1 drop 4 (four) times daily.     acetaminophen  (TYLENOL ) 500 MG tablet Take 1,000 mg by mouth every 6 (six) hours as needed.      promethazine -dextromethorphan  (PROMETHAZINE -DM) 6.25-15 MG/5ML syrup Take 2.5 mLs by mouth 4 (four) times daily as needed for cough. 118 mL 0   No facility-administered medications prior to visit.   Allergies  Allergen Reactions   Gabapentin Shortness Of Breath   Fluoxetine     Other reaction(s): Other (See Comments) Severe headache   Iodine-131 Itching and Swelling    And swollen eyelids    Naproxen Itching   Reglan  [Metoclopramide ] Other (See Comments)    dizzy   Tizanidine Other (See Comments)    Confusion    Ivp Dye [Iodinated Contrast Media] Rash   Objective:   Today's Vitals   11/21/23 0817  BP: 110/60  Pulse: 70  Temp: 97.9 F (36.6 C)  TempSrc: Temporal  SpO2: 94%  Weight: 208 lb (94.3 kg)  Height: 5\' 4"  (1.626 m)   Body mass index is 35.7 kg/m.   General: Well developed, well nourished. No acute distress. Psych: Alert and oriented. Normal mood and  affect.  Health Maintenance Due  Topic Date Due   HEMOGLOBIN A1C  Never done   FOOT EXAM  Never done   OPHTHALMOLOGY EXAM  Never done   Diabetic kidney evaluation - Urine ACR  Never done   Zoster Vaccines- Shingrix (1 of 2) Never done   DEXA SCAN  Never done   DTaP/Tdap/Td (2 - Td or Tdap) 02/04/2021   Medicare Annual Wellness (AWV)  06/07/2021     Assessment & Plan:   Problem List Items Addressed This Visit       Cardiovascular and Mediastinum   Essential hypertension   Blood pressure is in good control. Continue lisinopril 10 mg daily and metoprolol tartrate 100 mg bid. I will check renal labs today.        Endocrine   Type 2 diabetes mellitus with diabetic chronic kidney disease (HCC) - Primary   I will check annual DM labs today. Continue metformin  500 mg daily.      Relevant Orders   Lipid panel   Microalbumin / creatinine urine ratio   Basic metabolic panel with GFR   Hemoglobin A1c   Urinalysis, Routine w reflex microscopic     Musculoskeletal and Integument   Age-related osteoporosis without current pathological fracture   Review of old records shows last injection 11/09/2022. Her last bone density that I can find was form 2021. I will order a new bone density. She should continue her daily calcium  and Vitamin D  supplementation. I will refer her to the [facility] to resume Prolia therapy.      Relevant Orders   Amb Referral to Osteoporosis Management    DG Bone Density   Intertrigo   I will renew her Silvadene  for PRN use.      Relevant Medications   silver  sulfADIAZINE  (SILVADENE ) 1 % cream     Genitourinary   Chronic kidney disease, stage 4 (  severe) (HCC)   Last GFR was in a stage 4 CKD range. Cathy [place] is followed by nephrology. Continue focus on blood pressure and glucose control, adequate hydration, and avoidance of nephrotoxic medications. Continue ACE-I.        Other   Chronic bilateral low back pain without sciatica   As above.       Relevant Medications   HYDROcodone-acetaminophen  (NORCO/VICODIN) 5-325 MG tablet   Chronic pain syndrome   On chronic opioid therapy as directed by Dr. Brain Cahill.      Relevant Medications   HYDROcodone-acetaminophen  (NORCO/VICODIN) 5-325 MG tablet   Mixed hyperlipidemia   I will check her lipids. Continue atorvastatin  20 mg daily.       Return in about 3 months (around 02/20/2024) for Reassessment.   Graig Lawyer, MD

## 2023-11-21 NOTE — Assessment & Plan Note (Signed)
 Blood pressure is in good control. Continue lisinopril 10 mg daily and metoprolol tartrate 100 mg bid. I will check renal labs today.

## 2023-11-21 NOTE — Assessment & Plan Note (Signed)
 Last GFR was in a stage 4 CKD range. Cathy [place] is followed by nephrology. Continue focus on blood pressure and glucose control, adequate hydration, and avoidance of nephrotoxic medications. Continue ACE-I.

## 2023-11-21 NOTE — Assessment & Plan Note (Signed)
 Review of old records shows last injection 11/09/2022. Her last bone density that I can find was form 2021. I will order a new bone density. She should continue her daily calcium  and Vitamin D  supplementation. I will refer her to the [facility]oporosis Clinic to resume Prolia therapy.

## 2023-11-22 ENCOUNTER — Encounter: Payer: Self-pay | Admitting: Family Medicine

## 2023-12-05 ENCOUNTER — Ambulatory Visit: Payer: Self-pay

## 2023-12-05 ENCOUNTER — Telehealth: Payer: Self-pay | Admitting: Family Medicine

## 2023-12-05 NOTE — Telephone Encounter (Signed)
  Chief Complaint: BLE swelling Symptoms: swooshing in ear, swelling, Frequency: about 2-3 days Pertinent Negatives: Patient denies SOB,  Disposition: [] ED /[] Urgent Care (no appt availability in office) / [x] Appointment(In office/virtual)/ []  [facility]/ [] Home Care/ [] Refused Recommended Disposition /[] [facility]/ []  Follow-up with PCP Additional Notes: Pt states that she is having BLE, equally swollen. Pt states that it started 2-3 days ago. Pt states that she also has a swooshing in her ear. Pt agreeable to checking BP during call- 143/73. HR 78. Denies respiratory s/s. Pt states that the legs are shiny, not red nor draining liquid.   Reason for Disposition  [1] MODERATE leg swelling (e.g., swelling extends up to knees) AND [2] new-onset or worsening  Answer Assessment - Initial Assessment Questions 1. ONSET: "When did the swelling start?" (e.g., minutes, hours, days)     2-3 days 2. LOCATION: "What part of the leg is swollen?"  "Are both legs swollen or just one leg?"     BLE 3. SEVERITY: "How bad is the swelling?" (e.g., localized; mild, moderate, severe)   - Localized: Small area of swelling localized to one leg.   - MILD pedal edema: Swelling limited to foot and ankle, pitting edema < 1/4 inch (6 mm) deep, rest and elevation eliminate most or all swelling.   - MODERATE edema: Swelling of lower leg to knee, pitting edema > 1/4 inch (6 mm) deep, rest and elevation only partially reduce swelling.   - SEVERE edema: Swelling extends above knee, facial or hand swelling present.      Moderate,  4. REDNESS: "Does the swelling look red or infected?"     Look shiny, but not red nor draining 5. PAIN: "Is the swelling painful to touch?" If Yes, ask: "How painful is it?"   (Scale 1-10; mild, moderate or severe)     Denies pain 6. FEVER: "Do you have a fever?" If Yes, ask: "What is it, how was it measured, and when did it start?"      denies 7. CAUSE: "What do you think  is causing the leg swelling?"     Pt states that she has had this before, pt was not given dx, but was put on lasix .  8. MEDICAL HISTORY: "Do you have a history of blood clots (e.g., DVT), cancer, heart failure, kidney disease, or liver failure?" Pt states that she only has one kidney d/t kidney cancer 9. RECURRENT SYMPTOM: "Have you had leg swelling before?" If Yes, ask: "When was the last time?" "What happened that time?"     Very long time ago 10. OTHER SYMPTOMS: "Do you have any other symptoms?" (e.g., chest pain, difficulty breathing)       denies  Protocols used: Leg Swelling and Edema-A-AH

## 2023-12-05 NOTE — Telephone Encounter (Signed)
 Copied from CRM [id number]. Topic: Clinical - Pink Word Triage >> Dec 05, 2023  1:28 PM Cathy [place] wrote: Reason for Triage: Patient called today regarding swelling in her legs that began yesterday. She consulted her niece, who works in the medical field, and was advised to contact her PCP. The patient reports that her legs appear shiny but denies tightness or skin sensitivity. She is able to walk without difficulty. She mentioned that she was previously prescribed Lasix  by her former PCP. When asked if she would like to schedule an appointment to discuss treatment options, the patient expressed uncertainty and stated she was advised to contact the office but is unsure of the next steps. 1st attempt made; call cannot be completed as dialed.

## 2023-12-05 NOTE — Addendum Note (Signed)
 Addended by: Benn Brash on: 12/05/2023 02:01 PM   Modules accepted: Orders, Level of Service

## 2023-12-05 NOTE — Telephone Encounter (Addendum)
 Copied from CRM [id number]. Topic: Clinical - Pink Word Triage >> Dec 05, 2023  1:28 PM Caliyah H wrote: Reason for Triage: Patient called today regarding swelling in her legs that began yesterday. She consulted her niece, who works in the medical field, and was advised to contact her PCP. The patient reports that her legs appear shiny but denies tightness or skin sensitivity. She is able to walk without difficulty. She mentioned that she was previously prescribed Lasix  by her former PCP. When asked if she would like to schedule an appointment to discuss treatment options, the patient expressed uncertainty and stated she was advised to contact the office but is unsure of the next steps. 1st attempt made; call cannot be completed as dialed.    This encounter was created in error - please disregard.  Patient reports just spoke with someone and apt is scheduled.

## 2023-12-05 NOTE — Telephone Encounter (Signed)
 Noted.. has appointment with Dr Hildy Lowers 12/06/23.  Dm/cma

## 2023-12-06 ENCOUNTER — Other Ambulatory Visit: Payer: Self-pay

## 2023-12-06 ENCOUNTER — Ambulatory Visit (INDEPENDENT_AMBULATORY_CARE_PROVIDER_SITE_OTHER): Admitting: Family Medicine

## 2023-12-06 ENCOUNTER — Encounter: Payer: Self-pay | Admitting: Family Medicine

## 2023-12-06 ENCOUNTER — Emergency Department (HOSPITAL_COMMUNITY)

## 2023-12-06 ENCOUNTER — Emergency Department (HOSPITAL_COMMUNITY)
Admission: EM | Admit: 2023-12-06 | Discharge: 2023-12-06 | Disposition: A | Discharge status: Home / Self Care | Attending: Emergency Medicine | Admitting: Emergency Medicine

## 2023-12-06 VITALS — BP 138/70 | HR 72 | Temp 97.0°F | Resp 18 | Wt 210.6 lb

## 2023-12-06 DIAGNOSIS — N189 Chronic kidney disease, unspecified: Secondary | ICD-10-CM | POA: Insufficient documentation

## 2023-12-06 DIAGNOSIS — J45909 Unspecified asthma, uncomplicated: Secondary | ICD-10-CM | POA: Insufficient documentation

## 2023-12-06 DIAGNOSIS — Z7984 Long term (current) use of oral hypoglycemic drugs: Secondary | ICD-10-CM

## 2023-12-06 DIAGNOSIS — Z7951 Long term (current) use of inhaled steroids: Secondary | ICD-10-CM | POA: Insufficient documentation

## 2023-12-06 DIAGNOSIS — N184 Chronic kidney disease, stage 4 (severe): Secondary | ICD-10-CM

## 2023-12-06 DIAGNOSIS — R0602 Shortness of breath: Secondary | ICD-10-CM

## 2023-12-06 DIAGNOSIS — Z950 Presence of cardiac pacemaker: Secondary | ICD-10-CM | POA: Diagnosis not present

## 2023-12-06 DIAGNOSIS — Z79899 Other long term (current) drug therapy: Secondary | ICD-10-CM | POA: Insufficient documentation

## 2023-12-06 DIAGNOSIS — J4521 Mild intermittent asthma with (acute) exacerbation: Secondary | ICD-10-CM

## 2023-12-06 DIAGNOSIS — I10 Essential (primary) hypertension: Secondary | ICD-10-CM | POA: Diagnosis not present

## 2023-12-06 DIAGNOSIS — I495 Sick sinus syndrome: Secondary | ICD-10-CM

## 2023-12-06 DIAGNOSIS — R0789 Other chest pain: Secondary | ICD-10-CM

## 2023-12-06 DIAGNOSIS — R600 Localized edema: Secondary | ICD-10-CM | POA: Diagnosis not present

## 2023-12-06 DIAGNOSIS — E1122 Type 2 diabetes mellitus with diabetic chronic kidney disease: Secondary | ICD-10-CM

## 2023-12-06 LAB — CBC WITH DIFFERENTIAL/PLATELET
Abs Immature Granulocytes: 0.01 10*3/uL (ref 0.00–0.07)
Basophils Absolute: 0 10*3/uL (ref 0.0–0.1)
Basophils Relative: 1 %
Eosinophils Absolute: 0.1 10*3/uL (ref 0.0–0.5)
Eosinophils Relative: 2 %
HCT: 35.4 % — ABNORMAL LOW (ref 36.0–46.0)
Hemoglobin: 11.1 g/dL — ABNORMAL LOW (ref 12.0–15.0)
Immature Granulocytes: 0 %
Lymphocytes Relative: 38 %
Lymphs Abs: 1.1 10*3/uL (ref 0.7–4.0)
MCH: 30.5 pg (ref 26.0–34.0)
MCHC: 31.4 g/dL (ref 30.0–36.0)
MCV: 97.3 fL (ref 80.0–100.0)
Monocytes Absolute: 0.3 10*3/uL (ref 0.1–1.0)
Monocytes Relative: 9 %
Neutro Abs: 1.4 10*3/uL — ABNORMAL LOW (ref 1.7–7.7)
Neutrophils Relative %: 50 %
Platelets: 220 10*3/uL (ref 150–400)
RBC: 3.64 MIL/uL — ABNORMAL LOW (ref 3.87–5.11)
RDW: 13 % (ref 11.5–15.5)
WBC: 2.8 10*3/uL — ABNORMAL LOW (ref 4.0–10.5)
nRBC: 0 % (ref 0.0–0.2)

## 2023-12-06 LAB — COMPREHENSIVE METABOLIC PANEL WITH GFR
ALT: 6 U/L (ref 0–44)
AST: 18 U/L (ref 15–41)
Albumin: 3.3 g/dL — ABNORMAL LOW (ref 3.5–5.0)
Alkaline Phosphatase: 100 U/L (ref 38–126)
Anion gap: 8 (ref 5–15)
BUN: 22 mg/dL (ref 8–23)
CO2: 27 mmol/L (ref 22–32)
Calcium: 9.3 mg/dL (ref 8.9–10.3)
Chloride: 105 mmol/L (ref 98–111)
Creatinine, Ser: 1.59 mg/dL — ABNORMAL HIGH (ref 0.44–1.00)
GFR, Estimated: 32 mL/min — ABNORMAL LOW (ref >60)
Glucose, Bld: 97 mg/dL (ref 70–99)
Potassium: 4.9 mmol/L (ref 3.5–5.1)
Sodium: 140 mmol/L (ref 135–145)
Total Bilirubin: 2.5 mg/dL — ABNORMAL HIGH (ref 0.0–1.2)
Total Protein: 6.3 g/dL — ABNORMAL LOW (ref 6.5–8.1)

## 2023-12-06 LAB — BRAIN NATRIURETIC PEPTIDE: B Natriuretic Peptide: 181.2 pg/mL — ABNORMAL HIGH (ref 0.0–100.0)

## 2023-12-06 LAB — TROPONIN I (HIGH SENSITIVITY)
Troponin I (High Sensitivity): 4 ng/L (ref <18)
Troponin I (High Sensitivity): 4 ng/L (ref <18)

## 2023-12-06 MED ORDER — ALBUTEROL SULFATE (2.5 MG/3ML) 0.083% IN NEBU: 2.5000 mg | INHALATION_SOLUTION | Freq: Four times a day (QID) | RESPIRATORY_TRACT | 12 refills | Status: AC | PRN

## 2023-12-06 MED ORDER — ALBUTEROL SULFATE (2.5 MG/3ML) 0.083% IN NEBU
2.5000 mg | INHALATION_SOLUTION | Freq: Once | RESPIRATORY_TRACT | Status: AC
Start: 2023-12-06 — End: 2023-12-06
  Administered 2023-12-06: 2.5 mg via RESPIRATORY_TRACT

## 2023-12-06 MED ORDER — MOMETASONE FURO-FORMOTEROL FUM 200-5 MCG/ACT IN AERO
2.0000 | INHALATION_SPRAY | Freq: Two times a day (BID) | RESPIRATORY_TRACT | 3 refills | Status: DC
Start: 2023-12-06 — End: 2024-11-30

## 2023-12-06 MED ORDER — FUROSEMIDE 20 MG PO TABS
20.0000 mg | ORAL_TABLET | Freq: Every day | ORAL | 0 refills | Status: DC
Start: 2023-12-06 — End: 2023-12-13

## 2023-12-06 MED ORDER — POTASSIUM CHLORIDE ER 10 MEQ PO TBCR: 10.0000 meq | EXTENDED_RELEASE_TABLET | Freq: Every day | ORAL | 0 refills | Status: DC

## 2023-12-06 NOTE — Progress Notes (Signed)
 Assessment & Plan   Assessment/Plan:   Assessment & Plan Dyspnea Possibly Extrinsic asthma triggered by seasonal allergies. Symptoms include shortness of breath, chest tightness, and poor air movement with questionable wheeze. She has not been using Dulera  recently. Patient given albuterol  nebulizer, and says she has not improved. In fact she appears worse and slightly dyspneic and states that she cannot "catch her breath". ECG appears to have possible ST depression, more likely obscured by pacer, not classic STEMI. However, given significant cardiac risk including sick sinus syndrome, with cardiac pacer, advanced age, diabetes, CKD 4, OSA, hypertension, obesity, she needs further evaluation for cardiac issues due to risk factors and current symptoms. - Recommend patient go to the emergency department for further cardiac assessment - EMS services offered.  However patient declined and instead opted for transportation by private vehicle.  Patient is here with her daughter.  They plan to go directly to [facility] Emergency Department.  Extrinsic Asthma - Resume Dulera  2 puffs twice a day. - Use albuterol  nebulizers 2.5 mg every 6 hours as needed.  Chronic kidney disease stage 4 Contributing to overall health status.  Obesity BMI of 36, contributing to cardiac risk factors.  Type 2 diabetes mellitus Blood sugar well controlled. Last A1c was 6.2 on 11/21/2023. Managed with metformin  500 mg daily.  Obstructive sleep apnea Contributing to cardiac risk factors. Managed with CPAP.  Sick sinus syndrome with pacemaker Pacemaker last checked on 11/28/2023 with normal device function. Device is 44.82 years old.  GERD Symptoms not currently classic for GERD.       Medications Discontinued During This Encounter  Medication Reason   mometasone -formoterol  (DULERA ) 200-5 MCG/ACT AERO Reorder   albuterol  (PROVENTIL ) (2.5 MG/3ML) 0.083% nebulizer solution Reorder             Subjective:   Encounter date: 12/06/2023  Cathy Richardson is a 83 y.o. female who has Adenocarcinoma of transverse colon (HCC); GERD (gastroesophageal reflux disease); Vitamin D  deficiency; Chronic pain syndrome; Asthma; OSA on CPAP; Essential hypertension; Mixed hyperlipidemia; Chronic right shoulder pain; Chronic bilateral low back pain without sciatica; SSS (sick sinus syndrome) (HCC); Solitary kidney, acquired; RLS (restless legs syndrome); Personal history of renal cancer; Opioid use, unspecified with unspecified opioid-induced disorder (HCC); DISH (diffuse idiopathic skeletal hyperostosis); OA (osteoarthritis); Mixed incontinence; Major depression in remission (HCC); Lumbar spondylosis; History of colon cancer, stage I; Hammertoe; Hallux valgus; Class 2 severe obesity due to excess calories with serious comorbidity and body mass index (BMI) of 38.0 to 38.9 in adult [facility]); Chronic nausea; Chronic fatigue; Type 2 diabetes mellitus with diabetic chronic kidney disease (HCC); Age-related osteoporosis without current pathological fracture; Intertrigo; and Chronic kidney disease, stage 4 (severe) (HCC) on their problem list..   She  has a past medical history of Anemia, Arthritis, Asthma, Cancer (HCC), Chronic back pain, Depression, Diabetes mellitus without complication (HCC), Excessive somnolence disorder, Family history of adverse reaction to anesthesia, GERD (gastroesophageal reflux disease), Glaucoma, Headache, Heart murmur, High cholesterol, History of hiatal hernia, Hypertension, Osteoporosis, Peptic ulcer, Renal disorder, Sleep apnea, and Urinary tract infection..   She presents with chief complaint of Leg Swelling (Pt c/o of leg swelling for 2-3 days no pain is present. //HM due- vaccinations, foot exam, eye exam( scheduled for June 11th) and dexa scan ) and Ear Pain (Pt c/o of right ear discomfort; with swooshing sounds; symptoms have improved. ) .   Discussed the use of AI scribe software for  clinical note transcription with the patient, who gave  verbal consent to proceed.  History of Present Illness Cathy Richardson is an 83 year old female with asthma and heart conditions who presents with leg swelling and chest tightness. She is accompanied by a family member who will take her to the emergency department if needed.  She has been experiencing bilateral leg swelling for the past couple of weeks. The swelling is noticeable but not bothersome, and she denies any previous episodes of leg swelling. There is no history of blood clots.  She describes chest tightness and a sensation of fatigue. There is no chest pain, but she associates the chest tightness with her asthma. She has not used her albuterol  inhaler recently. No cough, wheezing, dizziness, or palpitations are present. She reports a runny nose, attributing it to her allergy asthma, which flares up occasionally due to certain smells. No recent asthma flare-ups are noted.  She mentions a bothersome swishing sound in her right ear.  She reports back pain, which is not new, and gastrointestinal issues following her last surgery. This causes her to need to use the bathroom shortly after eating, and she has not eaten all day due to this issue.       Past Surgical History:  Procedure Laterality Date   bowel obstruction surgery      x 2    Carpule Tunnel Release Bilateral    CHOLECYSTECTOMY     KNEE ARTHROCENTESIS     left knee   NEPHRECTOMY     PACEMAKER IMPLANT     Removal of Skin Cancer  N/A    Forehead   SKIN SPLIT GRAFT N/A 01/05/2015   Procedure: MANDIBULAR LINGUAL SPLIT THICKNESS SKIN GRAFT VESTIVULOPLASTY;  Surgeon: Pieter Bride, DDS;  Location: [facility] ORS;  Service: Oral Surgery;  Laterality: N/A;   TOTAL KNEE ARTHROPLASTY Left    left knee replacement     Outpatient Medications Prior to Visit  Medication Sig Dispense Refill   aspirin  EC 81 MG tablet Take 81 mg by mouth daily.      atorvastatin  (LIPITOR) 20 MG  tablet Take 20 mg by mouth at bedtime.     calcium  carbonate (TUMS - DOSED IN MG ELEMENTAL CALCIUM ) 500 MG chewable tablet Chew 2 tablets by mouth every morning.     Cholecalciferol  50 MCG (2000 UT) TABS Take 2,000 Units by mouth daily.     cyclobenzaprine  (FLEXERIL ) 10 MG tablet Take 10 mg by mouth daily as needed for muscle spasms.     denosumab (PROLIA) 60 MG/ML SOSY injection Inject 60 mg into the skin every 6 (six) months.     escitalopram  (LEXAPRO ) 20 MG tablet Take 20 mg by mouth daily.     fentaNYL  (DURAGESIC ) 25 MCG/HR Place 1 patch onto the skin every 3 (three) days.     fluticasone  (FLONASE ) 50 MCG/ACT nasal spray Place 2 sprays into both nostrils daily.      HYDROcodone-acetaminophen  (NORCO/VICODIN) 5-325 MG tablet Take 1 tablet by mouth every 12 (twelve) hours as needed.     latanoprost (XALATAN) 0.005 % ophthalmic solution 1 drop 4 (four) times daily.     lisinopril (ZESTRIL) 10 MG tablet Take 10 tablets by mouth daily.     metFORMIN  (GLUCOPHAGE -XR) 500 MG 24 hr tablet Take 500 mg by mouth daily before supper.     metoprolol tartrate (LOPRESSOR) 100 MG tablet Take 100 mg by mouth 2 (two) times daily.     Multiple Vitamin (MULTIVITAMIN) tablet Take 1 tablet by mouth daily.  Omega-3 Fatty Acids (FISH OIL) 1000 MG CAPS Take 1,000 mg by mouth daily.     pantoprazole  (PROTONIX ) 40 MG tablet Take 40 mg by mouth.     phenylephrine (SUDAFED PE) 10 MG TABS tablet Take 10 mg by mouth every 6 (six) hours as needed (congestion).     pramipexole  (MIRAPEX ) 0.125 MG tablet Take 0.125-0.25 mg by mouth See admin instructions. Taking one or two tablets at bedtime for restless leg     pregabalin  (LYRICA ) 25 MG capsule Take 25 mg by mouth in the morning and at bedtime.     silver  sulfADIAZINE  (SILVADENE ) 1 % cream Apply 1 Application topically 2 (two) times daily as needed (rash). 50 g 5   traMADol (ULTRAM) 50 MG tablet Take 1 tablet by mouth every 8 (eight) hours as needed.     albuterol   (PROVENTIL ) (2.5 MG/3ML) 0.083% nebulizer solution Take 3 mLs (2.5 mg total) by nebulization every 6 (six) hours as needed for wheezing or shortness of breath. 75 mL 12   mometasone -formoterol  (DULERA ) 200-5 MCG/ACT AERO Inhale 2 puffs into the lungs 2 (two) times daily as needed.     No facility-administered medications prior to visit.    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  Other Topics Concern   Not on file  Social History Narrative   Not on file   Social Drivers of Health   Financial Resource Strain: Not on file  Food Insecurity: Not on file  Transportation Needs: Not on file  Physical Activity: Not on file  Stress: Not on file  Social Connections: Not on file  Intimate Partner Violence: Not on file                                                                                                  Objective:  Physical Exam: BP 138/70 (BP Location: Left Arm, Patient Position: Sitting, Cuff Size: Large)   Pulse 72   Temp (!) 97 F (36.1 C) (Temporal)   Resp 18   Wt 210 lb 9.6 oz (95.5 kg)   SpO2 96%   BMI 36.15 kg/m   Wt Readings  from Last 3 Encounters:  12/06/23 210 lb 9.6 oz (95.5 kg)  11/21/23 208 lb (94.3 kg)  04/11/23 214 lb (97.1 kg)     Physical Exam VITALS: P- 72, BP- 130/70, RR- 18, SaO2- 96% MEASUREMENTS: BMI- 36.0. GENERAL: Alert, cooperative, well developed, no acute distress HEENT: Normocephalic, normal oropharynx, moist mucous membranes CHEST: Poor air movement, questionable wheeze, no basalar crackles, no change after nebulizer CARDIOVASCULAR: Normal heart rate and rhythm, S1 and S2 normal without murmurs ABDOMEN: Soft, non-tender, non-distended, without organomegaly, normal bowel sounds EXTREMITIES: Trace to plus one pitting edema bilaterally in lower extremities NEUROLOGICAL: Cranial nerves grossly intact, moves all extremities without gross motor or sensory deficit  Results LABS HbA1c: 6.2% (11/21/2023)  DIAGNOSTIC EKG: Sinus or ectopic atrial rhythm with abnormal R wave progression, early transition (04/11/2023) Pacer device check: Normal device function (11/28/2023) ECG: Possible ST depression, more likely obscured by pacer, not classic STEMI   Physical Exam  No results found.  Recent Results (from the past 2160 hours)  Lipid panel     Status: Abnormal   Collection Time: 11/21/23  9:45 AM  Result Value Ref Range   Cholesterol 112 0 - 200 mg/dL    Comment: ATP III Classification       Desirable:  < 200 mg/dL               Borderline High:  200 - 239 mg/dL          High:  > = 981 mg/dL   Triglycerides 191.4 (H) 0.0 - 149.0 mg/dL    Comment: Normal:  <782 mg/dLBorderline High:  150 - 199 mg/dL   HDL 95.62 (L) >13.08 mg/dL   VLDL 65.7 (H) 0.0 - 84.6 mg/dL   LDL Cholesterol 20 0 - 99 mg/dL   Total CHOL/HDL Ratio 3     Comment:                Men          Women1/2 Average Risk     3.4          3.3Average Risk          5.0          4.42X Average Risk          9.6          7.13X Average Risk          15.0          11.0                       NonHDL 73.23     Comment: NOTE:  Non-HDL goal should be 30 mg/dL higher than patient's LDL goal (i.e. LDL goal of < 70 mg/dL, would have non-HDL goal of < 100  mg/dL)  Microalbumin / creatinine urine ratio     Status: None   Collection Time: 11/21/23  9:45 AM  Result Value Ref Range   Microalb, Ur 1.7 0.0 - 1.9 mg/dL   Creatinine,U 962.9 mg/dL   Microalb Creat Ratio 8.9 0.0 - 30.0 mg/g  Basic metabolic panel with GFR     Status: Abnormal   Collection Time: 11/21/23  9:45 AM  Result Value Ref Range   Sodium 137 135 - 145 mEq/L   Potassium 4.6 3.5 - 5.1 mEq/L   Chloride 102 96 - 112 mEq/L   CO2 30 19 - 32 mEq/L   Glucose, Bld 108 (H) 70 - 99 mg/dL  BUN 27 (H) 6 - 23 mg/dL   Creatinine, Ser 1.61 (H) 0.40 - 1.20 mg/dL   GFR 09.60 (L) >45.40 mL/min    Comment: Calculated using the CKD-EPI Creatinine Equation (2021)   Calcium  9.6 8.4 - 10.5 mg/dL  Hemoglobin J8J     Status: None   Collection Time: 11/21/23  9:45 AM  Result Value Ref Range   Hgb A1c MFr Bld 6.2 4.6 - 6.5 %    Comment: Glycemic Control Guidelines for People with Diabetes:Non Diabetic:  <6%Goal of Therapy: <7%Additional Action Suggested:  >8%   Urinalysis, Routine w reflex microscopic     Status: Abnormal   Collection Time: 11/21/23  9:45 AM  Result Value Ref Range   Color, Urine YELLOW Yellow;Lt. Yellow;Straw;Dark Yellow;Amber;Green;Red;Brown   APPearance CLEAR Clear;Turbid;Slightly Cloudy;Cloudy   Specific Gravity, Urine 1.025 1.000 - 1.030   pH 6.0 5.0 - 8.0   Total Protein, Urine NEGATIVE Negative   Urine Glucose NEGATIVE Negative   Ketones, ur NEGATIVE Negative   Bilirubin Urine NEGATIVE Negative   Hgb urine dipstick NEGATIVE Negative   Urobilinogen, UA 0.2 0.0 - 1.0   Leukocytes,Ua NEGATIVE Negative   Nitrite NEGATIVE Negative   WBC, UA none seen 0-2/hpf   RBC / HPF none seen 0-2/hpf   Mucus, UA Presence of (A) None   Squamous Epithelial / HPF Rare(0-4/hpf) Rare(0-4/hpf)   Hyaline Casts, UA Presence of (A) None   Ca Oxalate Crys, UA Presence of (A) None        Carnell Christian, MD, MS

## 2023-12-06 NOTE — ED Triage Notes (Signed)
 Pt reports having SHOB and bilateral foot and leg swelling for 2 days. Pt went to PCP and was given a breathing treatment with no relief, so was sent here.Hx of asthma, permanent pacemaker.

## 2023-12-06 NOTE — Discharge Instructions (Signed)
 You are being started on a low-dose of a diuretic called Lasix  to help you urinate.  This may remove some of the fluid from your lungs in your legs and help with your breathing.  But you do need close follow-up with a cardiologist or specialist.  Your workup in the ER did not show signs of a heart attack, or other life-threatening condition, but we do see signs of potential mild congestive heart failure.  This will need further workup and diagnosis with a specialist.  You are welcome encouraged to use your own current cardiologist at [facility], but if you are looking for a referral to a local specialist, we have placed one in our system for [facility].  If you are having worsening shortness of breath, chest tightness, dizziness, lightheadedness, or any other emergency concerns, call 911 or return to the ER.

## 2023-12-06 NOTE — ED Provider Triage Note (Signed)
 Emergency Medicine Provider Triage Evaluation Note  Cathy Richardson , a 83 y.o. female  was evaluated in triage.  Pt complains of shortness of breath and chest discomfort.  Patient reports feeling like she is unable to take a deep breath and bilateral lower extremity edema for the past several days.  Was seen by her primary care and given a breathing treatment earlier today without improvement of symptoms.  Advised to come here for the evaluation.  Review of Systems  Positive: Chest discomfort, shortness of breath, lower extremity edema Negative: Recent hospitalizations or surgeries, blood thinner use  Physical Exam  BP 130/68   Pulse 64   Temp 97.6 F (36.4 C) (Oral)   Resp 17   SpO2 94%  Gen:   Awake, no distress   Resp:  Normal effort  MSK:   Moves extremities without difficulty  Other:    Medical Decision Making  Medically screening exam initiated at 6:48 PM.  Appropriate orders placed.  Paisleigh L Levings was informed that the remainder of the evaluation will be completed by another provider, this initial triage assessment does not replace that evaluation, and the importance of remaining in the ED until their evaluation is complete.    Sonnie Dusky, PA-C 12/06/23 1850

## 2023-12-06 NOTE — ED Provider Notes (Signed)
 [facility] AT [facility] Provider Note   CSN: [id number] Arrival date & time: 12/06/23  1738     History  Chief Complaint  Patient presents with   Shortness of Breath   Leg Swelling    Cathy Richardson is a 83 y.o. female with history of chronic kidney disease, sick sinus syndrome status post pacemaker, asthma, presenting to the ED with concern for chest tightness and reported poor air movement.  Patient reports has been feeling this way for 3 days or perhaps a week.  She describes a tightness sensation across her chest, denies wheezing, denies coughing, denies headache.  He says at times he feels he cannot catch her breath.  She went to see her PCP today who tried an albuterol  treatment but the patient felt no better afterwards, and he referred her to the ED out of concern for potential cardiac issue or syndrome.  Patient also reports she has had symmetrical swelling of her legs without pain for the past 3 days.  She denies known history of heart failure.  Sees a cardiologist at [facility].  Last had an office visit 1 month ago.  Per my review of the medical charts, patient was remote transmission showed normal function, no arrhythmic episodes noted.  Her cardiologist remarked evaluation notes that the patient has a dual-chamber Medtronic pacemaker for sick sinus syndrome implanted in 2019, essential hypertension, type 2 diabetes, paroxysmal atrial tachycardia on metoprolol 100 mg twice daily.  HPI     Home Medications Prior to Admission medications   Medication Sig Start Date End Date Taking? Authorizing Provider  furosemide  (LASIX ) 20 MG tablet Take 1 tablet (20 mg total) by mouth daily for 7 days. 12/06/23 12/13/23 Yes Naveya Ellerman, Janalyn Me, MD  potassium chloride  (KLOR-CON ) 10 MEQ tablet Take 1 tablet (10 mEq total) by mouth daily for 10 days. 12/06/23 12/16/23 Yes Kayron Kalmar, Janalyn Me, MD  albuterol  (PROVENTIL ) (2.5 MG/3ML) 0.083% nebulizer solution Take  3 mLs (2.5 mg total) by nebulization every 6 (six) hours as needed for wheezing or shortness of breath. 12/06/23   Catheryn Cluck, MD  aspirin  EC 81 MG tablet Take 81 mg by mouth daily.     [provider]  atorvastatin  (LIPITOR) 20 MG tablet Take 20 mg by mouth at bedtime. 12/21/19   [provider]  calcium  carbonate (TUMS - DOSED IN MG ELEMENTAL CALCIUM ) 500 MG chewable tablet Chew 2 tablets by mouth every morning.    [provider]  Cholecalciferol  50 MCG (2000 UT) TABS Take 2,000 Units by mouth daily.    [provider]  cyclobenzaprine  (FLEXERIL ) 10 MG tablet Take 10 mg by mouth daily as needed for muscle spasms. 05/23/13   [provider]  denosumab (PROLIA) 60 MG/ML SOSY injection Inject 60 mg into the skin every 6 (six) months. 06/07/20   [provider]  escitalopram  (LEXAPRO ) 20 MG tablet Take 20 mg by mouth daily. 12/22/19   [provider]  fentaNYL  (DURAGESIC ) 25 MCG/HR Place 1 patch onto the skin every 3 (three) days. 04/18/21   [provider]  fluticasone  (FLONASE ) 50 MCG/ACT nasal spray Place 2 sprays into both nostrils daily.  08/12/15   [provider]  HYDROcodone-acetaminophen  (NORCO/VICODIN) 5-325 MG tablet Take 1 tablet by mouth every 12 (twelve) hours as needed. 09/05/22   [provider]  latanoprost (XALATAN) 0.005 % ophthalmic solution 1 drop 4 (four) times daily.    [provider]  lisinopril (  ZESTRIL) 10 MG tablet Take 10 tablets by mouth daily. 08/25/19   [provider]  metFORMIN  (GLUCOPHAGE -XR) 500 MG 24 hr tablet Take 500 mg by mouth daily before supper.    [provider]  metoprolol tartrate (LOPRESSOR) 100 MG tablet Take 100 mg by mouth 2 (two) times daily. 11/23/19   [provider]  mometasone -formoterol  (DULERA ) 200-5 MCG/ACT AERO Inhale 2 puffs into the lungs 2 (two) times daily. 12/06/23 11/30/24  Catheryn Cluck, MD  Multiple Vitamin  (MULTIVITAMIN) tablet Take 1 tablet by mouth daily.    [provider]  Omega-3 Fatty Acids (FISH OIL) 1000 MG CAPS Take 1,000 mg by mouth daily.    [provider]  pantoprazole  (PROTONIX ) 40 MG tablet Take 40 mg by mouth. 12/01/14   [provider]  phenylephrine (SUDAFED PE) 10 MG TABS tablet Take 10 mg by mouth every 6 (six) hours as needed (congestion).    [provider]  pramipexole  (MIRAPEX ) 0.125 MG tablet Take 0.125-0.25 mg by mouth See admin instructions. Taking one or two tablets at bedtime for restless leg 01/26/20   [provider]  pregabalin  (LYRICA ) 25 MG capsule Take 25 mg by mouth in the morning and at bedtime. 03/16/21   [provider]  silver  sulfADIAZINE  (SILVADENE ) 1 % cream Apply 1 Application topically 2 (two) times daily as needed (rash). 11/21/23   Graig Lawyer, MD  traMADol (ULTRAM) 50 MG tablet Take 1 tablet by mouth every 8 (eight) hours as needed. 02/22/20   [provider]      Allergies    Gabapentin, Fluoxetine, Iodine-131, Naproxen, Reglan  [metoclopramide ], Tizanidine, and Ivp dye [iodinated contrast media]    Review of Systems   Review of Systems  Physical Exam Updated Vital Signs BP (!) 153/72   Pulse 66   Temp 98 F (36.7 C) (Oral)   Resp 17   SpO2 95%  Physical Exam Constitutional:      General: She is not in acute distress. HENT:     Head: Normocephalic and atraumatic.  Eyes:     Conjunctiva/sclera: Conjunctivae normal.     Pupils: Pupils are equal, round, and reactive to light.  Cardiovascular:     Rate and Rhythm: Normal rate and regular rhythm.  Pulmonary:     Effort: Pulmonary effort is normal. No respiratory distress.  Abdominal:     General: There is no distension.     Tenderness: There is no abdominal tenderness.  Musculoskeletal:     Right lower leg: Edema present.     Left lower leg: Edema present.  Skin:    General: Skin is warm and dry.  Neurological:      General: No focal deficit present.     Mental Status: She is alert. Mental status is at baseline.  Psychiatric:        Mood and Affect: Mood normal.        Behavior: Behavior normal.     ED Results / Procedures / Treatments   Labs (all labs ordered are listed, but only abnormal results are displayed) Labs Reviewed  COMPREHENSIVE METABOLIC PANEL WITH GFR - Abnormal; Notable for the following components:      Result Value   Creatinine, Ser 1.59 (*)    Total Protein 6.3 (*)    Albumin 3.3 (*)    Total Bilirubin 2.5 (*)    GFR, Estimated 32 (*)    All other components within normal limits  CBC WITH DIFFERENTIAL/PLATELET - Abnormal; Notable  for the following components:   WBC 2.8 (*)    RBC 3.64 (*)    Hemoglobin 11.1 (*)    HCT 35.4 (*)    Neutro Abs 1.4 (*)    All other components within normal limits  BRAIN NATRIURETIC PEPTIDE - Abnormal; Notable for the following components:   B Natriuretic Peptide 181.2 (*)    All other components within normal limits  TROPONIN I (HIGH SENSITIVITY)  TROPONIN I (HIGH SENSITIVITY)    EKG EKG Interpretation Date/Time:  Friday Dec 06 2023 18:54:02 EDT Ventricular Rate:  79 PR Interval:  206 QRS Duration:  169 QT Interval:  442 QTC Calculation: 507 R Axis:   -62  Text Interpretation: Sinus rhythm Left bundle branch block Confirmed by Jerald Molly [phone]) on 12/06/2023 9:00:25 PM  Radiology DG Chest 2 View Result Date: 12/06/2023 CLINICAL DATA:  SOB EXAM: CHEST - 2 VIEW COMPARISON:  April 11 2023, January 24, 2022 FINDINGS: No focal airspace consolidation, pleural effusion, or pneumothorax. No cardiomegaly. Left chest pacemaker terminating in the right atrium and right ventricle. Dextrocurvature of the thoracic spine. Multilevel degenerative disc disease of the spine. Epigastric surgical clips. IMPRESSION: No acute cardiopulmonary abnormality. Electronically Signed   By: Rance Burrows M.D.   On: 12/06/2023 19:34     Procedures Procedures    Medications Ordered in ED Medications - No data to display  ED Course/ Medical Decision Making/ A&P                                 Medical Decision Making Risk Prescription drug management.   This patient presents to the ED with concern for chest tightness, shortness of breath, leg swelling. This involves an extensive number of treatment options, and is a complaint that carries with it a high risk of complications and morbidity.  The differential diagnosis includes CHF versus atypical ACS versus pleural effusion versus pneumonia versus other  Co-morbidities that complicate the patient evaluation: History of pacemaker, sick sinus syndrome, at risk of cardiac complication  External records from outside source obtained and reviewed including outpatient [facility] cardiology records as noted above, PCP office records from today  I ordered and personally interpreted labs.  The pertinent results include: No emergent findings.  The patient has mild leukopenia, stable anemia, BNP is mildly elevated at 181.  Delta troponins are negative  I ordered imaging studies including x-ray of the chest I independently visualized and interpreted imaging which showed no emergent findings I agree with the radiologist interpretation  The patient was maintained on a cardiac monitor.  I personally viewed and interpreted the cardiac monitored which showed an underlying rhythm of: Paced rhythm  Per my interpretation the patient's ECG shows ventricular paced rhythm without acute ischemic findings   Test Considered: I have a low overall suspicion for acute PE.  Do not feel the patient needed an emergent CT angiogram.  Low suspicion for aortic dissection    Disposition:  After consideration of the diagnostic results and the patients response to treatment, I feel that the patient would benefit from close outpatient follow-up.  I think the patient would benefit from a short  course of diuresis, 7 days of low-dose Lasix  with potassium supplementation.  Although she is not overtly volume overloaded, I suspect some of her dips and leg swelling is likely related to mild congestive heart failure, and she would benefit from cardiology follow-up for this.  I do  not hear wheezing on exam to suggest that this is an asthma exacerbation, which she has only rarely anyway.   She does see an EP doctor at [facility] but had requested a referral to our heart group for general cardiology, as she is not certain her EP specialist would manage generalized heart failure, and I am not sure either.  I did recommend she call their office first and foremost to try to follow-up with her own providers, rather than switch to a new practice, but if there is going to be a significant delay or she cannot be accommodated, she can establish care locally with [facility].         Final Clinical Impression(s) / ED Diagnoses Final diagnoses:  Peripheral edema  Shortness of breath  Chest tightness    Rx / DC Orders ED Discharge Orders          Ordered    Ambulatory referral to Cardiology       Comments: Patient sees EP at [facility] but requested alternative general cardiology locally Referral to chest pressure, shortness of breath, potential new onset CHF   12/06/23 2152    furosemide  (LASIX ) 20 MG tablet  Daily        12/06/23 2152    potassium chloride  (KLOR-CON ) 10 MEQ tablet  Daily        12/06/23 2152              Arvilla Birmingham, MD 12/06/23 2308

## 2023-12-09 ENCOUNTER — Telehealth: Payer: Self-pay

## 2023-12-09 ENCOUNTER — Other Ambulatory Visit: Payer: Self-pay

## 2023-12-09 ENCOUNTER — Other Ambulatory Visit (HOSPITAL_COMMUNITY): Payer: Self-pay

## 2023-12-09 DIAGNOSIS — J4530 Mild persistent asthma, uncomplicated: Secondary | ICD-10-CM

## 2023-12-09 NOTE — Telephone Encounter (Signed)
 Pharmacy Patient Advocate Encounter   Received notification from [employer]base that prior authorization for Dulera  200/5 mg/act inhaler is required/requested.   Insurance verification completed.   The patient is insured through [employer] .   Per test claim:  Symbicort, Breo or Arnuity is preferred by the insurance.  If suggested medication is appropriate, Please send in a new RX and discontinue this one. If not, please advise as to why it's not appropriate so that we may request a Prior Authorization. Please note, some preferred medications may still require a PA.  If the suggested medications have not been trialed and there are no contraindications to their use, the PA will not be submitted, as it will not be approved.

## 2023-12-09 NOTE — Telephone Encounter (Signed)
 Pharmacy Patient Advocate Encounter  Received notification from [employer] that Prior Authorization for Albuterol  Sulfate (2.5 MG/3ML)0.083% nebulizer solution has been CANCELLED, medication is not eligible for pharmacy benefits, medication must be billed through MEDICARE PART B As our team currently only handles pharmacy related PA's, medical PA's must be submitted by the clinic.

## 2023-12-10 ENCOUNTER — Encounter: Payer: Self-pay | Admitting: Cardiology

## 2023-12-10 ENCOUNTER — Ambulatory Visit: Attending: Cardiology | Admitting: Cardiology

## 2023-12-10 VITALS — BP 122/56 | HR 79 | Resp 16 | Ht 64.0 in | Wt 208.2 lb

## 2023-12-10 DIAGNOSIS — I495 Sick sinus syndrome: Secondary | ICD-10-CM | POA: Diagnosis present

## 2023-12-10 DIAGNOSIS — I5033 Acute on chronic diastolic (congestive) heart failure: Secondary | ICD-10-CM | POA: Insufficient documentation

## 2023-12-10 DIAGNOSIS — I10 Essential (primary) hypertension: Secondary | ICD-10-CM | POA: Diagnosis present

## 2023-12-10 MED ORDER — FUROSEMIDE 40 MG PO TABS: 40.0000 mg | ORAL_TABLET | Freq: Every day | ORAL | 3 refills | Status: DC

## 2023-12-10 MED ORDER — BUDESONIDE-FORMOTEROL FUMARATE 160-4.5 MCG/ACT IN AERO
2.0000 | INHALATION_SPRAY | Freq: Two times a day (BID) | RESPIRATORY_TRACT | 3 refills | Status: AC
Start: 2023-12-10 — End: ?

## 2023-12-10 NOTE — Progress Notes (Signed)
 Cardiology Office Note:  .   Date:  12/10/2023  ID:  Cathy Richardson, DOB 01-05-1941, MRN [id number] PCP: Cathy Lawyer, MD  [facility] Providers Cardiologist:  Cathy Ivy, MD PCP: Cathy Lawyer, MD  Chief Complaint  Patient presents with   Shortness of Breath   Edema   New Patient (Initial Visit)     Cathy Richardson is a 83 y.o. female with hypertension, hyperlipidemia, sick sinus syndrome s/p Medtronic dual-chamber pacemaker placement,  paroxysmal atrial tachycardia, OSA  Discussed the use of AI scribe software for clinical note transcription with the patient, who gave verbal consent to proceed.  History of Present Illness  Patient lives with her daughter Cathy Richardson today.  Patient lives with her other daughter, reports very stressful environment at home.  She was recently seen in the emergency room with complaints of constant chest pressure and shortness of breath.  Workup showed no ACS, BNP was mildly elevated.  She was recommended follow-up with cardiology.  It appears that patient was previously established with cardiologist Dr. Duanne Richardson in [facility], but reports that they were not able to see her urgently.  Therefore, she would like to see us  back switch her cardiac care to us .  Patient reports recent worsening exertional dyspnea, swelling and constant chest pressure.  On specific questioning, patient does seem to eat outside food a lot, including [employer] and other fast food.  She does not monitor her salt intake.      Vitals:   12/10/23 1559  BP: (!) 122/56  Pulse: 79  Resp: 16  SpO2: 97%      Review of Systems  Cardiovascular:  Positive for chest pain, dyspnea on exertion and leg swelling. Negative for palpitations and syncope.        Studies Reviewed: Cathy Richardson       Independently interpreted EKG 12/06/2022: Personally reviewed and disagree with official read on file.   EKG shows AV dual paced rhythm  Independently interpreted 11/2023: Chol  112, TG 266, HDL 38, LDL 20 HbA1C 6.2% Hb 11 Cr 1.59, eGFR 32 Protein 6,3, albumin 3,3, T.bili 2.5 BNP 181    Physical Exam Vitals and nursing note reviewed.  Constitutional:      General: She is not in acute distress. Neck:     Vascular: No JVD.  Cardiovascular:     Rate and Rhythm: Normal rate and regular rhythm.     Heart sounds: Normal heart sounds. No murmur heard. Pulmonary:     Effort: Pulmonary effort is normal.     Breath sounds: Normal breath sounds. No wheezing or rales.  Musculoskeletal:     Right lower leg: Edema (2+) present.     Left lower leg: Edema (1+) present.      VISIT DIAGNOSES:   ICD-10-CM   1. Acute on chronic heart failure with preserved ejection fraction (HCC)  I50.33 ECHOCARDIOGRAM COMPLETE    2. SSS (sick sinus syndrome) (HCC)  I49.5 Ambulatory referral to Cardiac Electrophysiology    3. Essential hypertension  I10        Cathy Richardson is a 83 y.o. female with hypertension, hyperlipidemia, sick sinus syndrome s/p Medtronic dual-chamber pacemaker placement, paroxysmal atrial tachycardia, OSA  Assessment & Plan Exertional dyspnea, leg edema: Findings concerning for congestive heart failure.  In absence of recent echocardiogram, will presume acute on chronic HFpEF.  She was taking Lasix  20 mg daily since her ER visit.  Increase Lasix  to 40 mg daily. Check echocardiogram. Counseled regarding low-salt  diet. She remains at high risk of recurrent hospitalization, and therefore needs close follow-up in 4 weeks to reassess her heart failure with.  After adequate diuresis, she will likely need stress testing for ischemia evaluation, if chest pain persists.  Sick sinus syndrome with pacemaker: Dual-chamber Medtronic pacemaker placed in [employer].  As patient would like to switch her care to us , I will refer her to EP monitoring of pacemaker.  Hypertension: Currently well-controlled.  No changes made.    F/u in 4-6 weeks  Signed, Cathy Das, MD

## 2023-12-10 NOTE — Patient Instructions (Addendum)
 MEDICATIONS: INCREASE Lasix  to 40 mg   Testing/Procedures: ECHO  Your physician has requested that you have an echocardiogram. Echocardiography is a painless test that uses sound waves to create images of your heart. It provides your doctor with information about the size and shape of your heart and how well your heart's chambers and valves are working. This procedure takes approximately one hour. There are no restrictions for this procedure. Please do NOT wear cologne, perfume, aftershave, or lotions (deodorant is allowed). Please arrive 15 minutes prior to your appointment time.  Please note: We ask at that you not bring children with you during ultrasound (echo/ vascular) testing. Due to room size and safety concerns, children are not allowed in the ultrasound rooms during exams. Our front office staff cannot provide observation of children in our lobby area while testing is being conducted. An adult accompanying a patient to their appointment will only be allowed in the ultrasound room at the discretion of the ultrasound technician under special circumstances. We apologize for any inconvenience.  REFERRAL TO EP   Follow-Up: At [facility], you and your health needs are our priority.  As part of our continuing mission to provide you with exceptional heart care, our providers are all part of one team.  This team includes your primary Cardiologist (physician) and Advanced Practice Providers or APPs (Physician Assistants and Nurse Practitioners) who all work together to provide you with the care you need, when you need it.  Your next appointment:   4 week(s)  Provider:   Cody Das, MD or APP    We recommend signing up for the patient portal called "MyChart".  Sign up information is provided on this After Visit Summary.  MyChart is used to connect with patients for Virtual Visits (Telemedicine).  Patients are able to view lab/test results, encounter notes, upcoming  appointments, etc.  Non-urgent messages can be sent to your provider as well.   To learn more about what you can do with MyChart, go to [email or web address].

## 2023-12-10 NOTE — Addendum Note (Signed)
 Addended by: Evan Hillock on: 12/10/2023 06:13 PM   Modules accepted: Orders

## 2023-12-11 ENCOUNTER — Other Ambulatory Visit (HOSPITAL_COMMUNITY): Payer: Self-pay

## 2023-12-13 ENCOUNTER — Telehealth: Payer: Self-pay | Admitting: Cardiology

## 2023-12-13 NOTE — Telephone Encounter (Signed)
 Pt c/o medication issue:  1. Name of Medication:   potassium chloride  (KLOR-CON ) 10 MEQ tablet    2. How are you currently taking this medication (dosage and times per day)? As written   3. Are you having a reaction (difficulty breathing--STAT)? No   4. What is your medication issue? Pt called in asking if she should still be on potassium. She asked if she does, to please send to [facility]

## 2023-12-13 NOTE — Telephone Encounter (Signed)
 Last office visit we increased Lasix  to 40 mg daily. Potassium resulted 7 days ago at 4.9.

## 2023-12-13 NOTE — Telephone Encounter (Signed)
 I spoke with the pharmacy and she already picked it up with a copay of $45.

## 2023-12-18 ENCOUNTER — Ambulatory Visit (INDEPENDENT_AMBULATORY_CARE_PROVIDER_SITE_OTHER): Admitting: Physician Assistant

## 2023-12-18 ENCOUNTER — Encounter: Payer: Self-pay | Admitting: Physician Assistant

## 2023-12-18 DIAGNOSIS — M810 Age-related osteoporosis without current pathological fracture: Secondary | ICD-10-CM | POA: Diagnosis not present

## 2023-12-18 NOTE — Progress Notes (Signed)
 Office Visit Note   Patient: Cathy Richardson           Date of Birth: 01-14-1941           MRN: [id number] Visit Date: 12/18/2023              Requested by: Graig Lawyer, MD [address] [place],  [place] [address] PCP: Graig Lawyer, MD   Assessment & Plan: Visit Diagnoses:  1. Age-related osteoporosis without current pathological fracture     Plan: Patient is a pleasant 83 year old woman who comes in today for evaluation of osteoporosis.  She is accompanied by her daughter.  She does states she was on Prolia until several months ago she did not continue with it.  Was not because of side effects she is not quite sure why.  Sounds like she may have been on Reclast in the past.  She has had a remote history of a foot fracture.  She does have some cardiac issues but no recent heart attack or stroke.  She does have a history of colon and kidney cancer and has some kidney disease.  No history of gastric or palp peptic ulcer bypass surgery no severe reflux no has history of epilepsy.  She is not sure how much calcium  she takes she began menopause in her 13s without hormone replacement.  She admits she has not taken vitamin D  in a while.  Both recent labs showed both her calcium  and vitamin D  to be adequate.  She has never been a smoker she does not drink alcohol  and does no kind of exercise she has not had any recent dental work.  No history of family fragility fractures.  We did reorder her a bone density scan as she has not had one in quite a while.  I talked to her and reviewed her chart in detail 45 minutes about possible treatments.  It is very clear she would be best served with going back on Prolia.  She understands this is a lifetime commitment.  We also talked about vitamin D  and calcium  and gave them ideas about doing some light strengthening through [employer]  sneakers program.  Will contact them when the bone scan comes back more than likely will recommend  Prolia  Follow-Up Instructions: Return if symptoms worsen or fail to improve.   Orders:  Orders Placed This Encounter  Procedures   DG BONE DENSITY (DXA)   No orders of the defined types were placed in this encounter.     Procedures: No procedures performed   Clinical Data: No additional findings.   Subjective: No chief complaint on file.   HPI pleasant 83 year old woman comes in today referral for evaluation of osteoporosis and possible treatments.  She is accompanied by her daughter.  She is ambulating with a cane  Review of Systems  All other systems reviewed and are negative.    Objective: Vital Signs: There were no vitals taken for this visit.  Physical Exam Constitutional:      Appearance: Normal appearance.  Pulmonary:     Effort: Pulmonary effort is normal.  Neurological:     General: No focal deficit present.     Mental Status: She is alert and oriented to person, place, and time.  Psychiatric:        Mood and Affect: Mood normal.        Behavior: Behavior normal.       Specialty Comments:  No specialty comments available.  Imaging: No results found.   PMFS History: Patient Active Problem List   Diagnosis Date Noted   Hammertoe 11/21/2023   Hallux valgus 11/21/2023   Intertrigo 11/21/2023   Chronic kidney disease, stage 4 (severe) (HCC) 11/21/2023   Chronic nausea 06/03/2023   DISH (diffuse idiopathic skeletal hyperostosis) 01/08/2023   SSS (sick sinus syndrome) (HCC) 10/01/2022   Opioid use, unspecified with unspecified opioid-induced disorder (HCC) 05/02/2022   Chronic right shoulder pain 08/21/2021   OSA on CPAP 05/29/2021   Solitary kidney, acquired 07/18/2020   Chronic fatigue 11/26/2019   Major depression in remission (HCC) 01/29/2019   Mixed hyperlipidemia 09/05/2018   RLS (restless legs syndrome) 09/05/2018   Class 2 severe obesity due to excess calories with serious comorbidity and body mass index (BMI) of 38.0 to 38.9 in  adult (HCC) 09/05/2018   Type 2 diabetes mellitus with diabetic chronic kidney disease (HCC) 09/05/2018   Chronic bilateral low back pain without sciatica 05/26/2018   History of colon cancer, stage I 12/13/2015   Adenocarcinoma of transverse colon (HCC) 11/12/2015   GERD (gastroesophageal reflux disease) 11/12/2015   Vitamin D  deficiency 11/12/2015   Chronic pain syndrome 11/12/2015   Asthma 11/12/2015   Leg edema 01/05/2015   Essential hypertension 06/17/2013   Personal history of renal cancer 06/17/2013   OA (osteoarthritis) 06/17/2013   Mixed incontinence 06/17/2013   Lumbar spondylosis 06/17/2013   Age-related osteoporosis without current pathological fracture 06/17/2013   Past Medical History:  Diagnosis Date   Anemia    Arthritis    Asthma    Cancer (HCC)    History of Renal Cancer   Chronic back pain    Depression    Diabetes mellitus without complication (HCC)    Excessive somnolence disorder    Family history of adverse reaction to anesthesia    son has problems with nausea    GERD (gastroesophageal reflux disease)    Glaucoma    pt denies on preop visit of 01/03/2015    Headache    Heart murmur    High cholesterol    History of hiatal hernia    Hypertension    Osteoporosis    Peptic ulcer    Renal disorder    only one kidney    Sleep apnea    cpap- does not know settings    Urinary tract infection    hx     Family History  Problem Relation Age of Onset   Stroke Mother    Diabetes Mother    Hypertension Mother    Stroke Father    Hypertension Father    Diabetes Daughter    Cancer Daughter        Cervical   Cancer Son        Colon   Cancer Son        Colon    Past Surgical History:  Procedure Laterality Date   bowel obstruction surgery      x 2    Carpule Tunnel Release Bilateral    CHOLECYSTECTOMY     KNEE ARTHROCENTESIS     left knee   NEPHRECTOMY     PACEMAKER IMPLANT     Removal of Skin Cancer  N/A    Forehead   SKIN SPLIT GRAFT N/A  01/05/2015   Procedure: MANDIBULAR LINGUAL SPLIT THICKNESS SKIN GRAFT VESTIVULOPLASTY;  Surgeon: Pieter Bride, DDS;  Location: [facility];  Service: Oral Surgery;  Laterality: N/A;   TOTAL KNEE ARTHROPLASTY Left    left  knee replacement    Social History   Occupational History   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

## 2023-12-20 ENCOUNTER — Other Ambulatory Visit (HOSPITAL_COMMUNITY): Payer: Self-pay

## 2023-12-30 ENCOUNTER — Encounter: Payer: Self-pay | Admitting: Cardiovascular Disease

## 2023-12-30 ENCOUNTER — Ambulatory Visit: Attending: Cardiovascular Disease | Admitting: Cardiovascular Disease

## 2023-12-30 VITALS — BP 103/67 | HR 71 | Ht 64.0 in | Wt 208.0 lb

## 2023-12-30 DIAGNOSIS — I495 Sick sinus syndrome: Secondary | ICD-10-CM | POA: Insufficient documentation

## 2023-12-30 NOTE — Progress Notes (Signed)
  Electrophysiology Office Note:    Date:  12/30/2023   ID:  CLARECE DRZEWIECKI, DOB 1941/07/02, MRN [id number]  PCP:  Graig Lawyer, MD   [facility] Providers Cardiologist:  Cody Das, MD     Referring MD: Cody Das, MD   History of Present Illness:    Cathy Richardson is a 83 y.o. female with a medical history significant for sick sinus syndrome and Medtronic dual-chamber pacemaker, referred for pacemaker management.      Discussed the use of AI scribe software for clinical note transcription with the patient, who gave verbal consent to proceed.  History of Present Illness Cathy Richardson is an 83 year old female who presents for a device check of her pacemaker.  This is her second pacemaker. The first pacemaker was removed after she experienced a sensation of something not being right in her chest during the night. She was informed of a malfunction with the first pacemaker, initially thought to be due to an external incident, although she denies any such event.  Currently, she has no problems with the device.         Today, she reports that she is doing well. she has no device related complaints -- no new tenderness, drainage, redness.   EKGs/Labs/Other Studies Reviewed Today:     Echocardiogram:  TTE Scheduled     EKG:   EKG Interpretation Date/Time:  Monday December 30 2023 15:23:33 EDT Ventricular Rate:  71 PR Interval:  324 QRS Duration:  128 QT Interval:  406 QTC Calculation: 441 R Axis:   -43  Text Interpretation: Atrial-paced rhythm with prolonged AV conduction Left axis deviation Right bundle branch block Possible Lateral infarct , age undetermined Inferior infarct , age undetermined When compared with ECG of 06-Dec-2023 18:54, AV conduction has replaced ventricular pacing Confirmed by Marlane Silver [phone]) on 12/30/2023 3:31:40 PM     Physical Exam:    VS:  BP 103/67 (BP Location: Left Arm, Patient Position: Sitting, Cuff  Size: Large)   Pulse 71   Ht 5\' 4"  (1.626 m)   Wt 208 lb (94.3 kg)   SpO2 96%   BMI 35.70 kg/m     Wt Readings from Last 3 Encounters:  12/30/23 208 lb (94.3 kg)  12/10/23 208 lb 3.2 oz (94.4 kg)  12/06/23 210 lb 9.6 oz (95.5 kg)     GEN: Well nourished, well developed in no acute distress CARDIAC: RRR, no murmurs, rubs, gallops RESPIRATORY:  Normal work of breathing MUSCULOSKELETAL: no edema    ASSESSMENT & PLAN:     Sick sinus syndrome Medtronic dual-chamber pacemaker in place, functioning normally I reviewed today's device interrogation.  See Paceart for details She is device dependent today  Hypertension Blood pressure well-controlled today No changes    Signed, Efraim Grange, MD  12/30/2023 4:00 PM    [facility]

## 2023-12-30 NOTE — Patient Instructions (Signed)
 Medication Instructions:  Your physician recommends that you continue on your current medications as directed. Please refer to the Current Medication list given to you today.  *If you need a refill on your cardiac medications before your next appointment, please call your pharmacy*  Lab Work: None ordered.  If you have labs (blood work) drawn today and your tests are completely normal, you will receive your results only by: MyChart Message (if you have MyChart) OR A paper copy in the mail If you have any lab test that is abnormal or we need to change your treatment, we will call you to review the results.  Testing/Procedures: None ordered.   Follow-Up: At [facility], you and your health needs are our priority.  As part of our continuing mission to provide you with exceptional heart care, our providers are all part of one team.  This team includes your primary Cardiologist (physician) and Advanced Practice Providers or APPs (Physician Assistants and Nurse Practitioners) who all work together to provide you with the care you need, when you need it.  Your next appointment:   12 months with Dr Mealor

## 2023-12-31 ENCOUNTER — Ambulatory Visit (HOSPITAL_BASED_OUTPATIENT_CLINIC_OR_DEPARTMENT_OTHER)
Admission: RE | Admit: 2023-12-31 | Discharge: 2023-12-31 | Disposition: A | Discharge status: Home / Self Care | Source: Ambulatory Visit | Attending: Physician Assistant | Admitting: Physician Assistant

## 2023-12-31 DIAGNOSIS — M810 Age-related osteoporosis without current pathological fracture: Secondary | ICD-10-CM | POA: Diagnosis present

## 2024-01-05 NOTE — Progress Notes (Deleted)
 Cardiology Office Note    Patient Name: Cathy Richardson Date of Encounter: 01/05/2024  Primary Care Provider:  Graig Lawyer, MD Primary Cardiologist:  Cody Das, MD Primary Electrophysiologist: None   Past Medical History    Past Medical History:  Diagnosis Date   Anemia    Arthritis    Asthma    Cancer [facility])    History of Renal Cancer   Chronic back pain    Depression    Diabetes mellitus without complication (HCC)    Excessive somnolence disorder    Family history of adverse reaction to anesthesia    son has problems with nausea    GERD (gastroesophageal reflux disease)    Glaucoma    pt denies on preop visit of 01/03/2015    Headache    Heart murmur    High cholesterol    History of hiatal hernia    Hypertension    Osteoporosis    Peptic ulcer    Renal disorder    only one kidney    Sleep apnea    cpap- does not know settings    Urinary tract infection    hx     History of Present Illness  Cathy Richardson is a 83 y.o. female with a PMH of sick sinus syndrome s/p Medtronic dual-chamber PPM, paroxysmal atrial tachycardia, OSA, HTN, HLD who presents today for 1 month follow-up.  Ms. Stadel was seen initially by Dr. Patwarhdan for initial visit and to establish care.  She had been seen recently in the emergency room with plaint of constant chest pressure and shortness of breath.  She underwent a workup for ACS that was negative with mildly elevated BNP.  She was previously followed by Dr. Duanne Gess for cardiology in [facility].  During her visit she reported indiscretions with salt and blood pressure was stable at 122/56.  She was found to have 2+ pitting edema in her right lower extremity and 1+ in the left lower extremity.  She has been taking Lasix  20 mg since her ED visit which was increased to 40 mg and patient underwent a 2D echo is scheduled to be completed on 01/15/2024.  She was referred to EP to establish care and was seen by Dr. Arlester Ladd on 12/30/2023 and during  visit blood pressure was well-controlled and patient denied any device related problems.  Device interrogation completed showing device dependent with no recommendations or changes.   Patient denies chest pain, palpitations, dyspnea, PND, orthopnea, nausea, vomiting, dizziness, syncope, edema, weight gain, or early satiety.   Discussed the use of AI scribe software for clinical note transcription with the patient, who gave verbal consent to proceed.  History of Present Illness    ***Notes:   Review of Systems  Please see the history of present illness.    All other systems reviewed and are otherwise negative except as noted above.  Physical Exam     Wt Readings from Last 3 Encounters:  12/30/23 208 lb (94.3 kg)  12/10/23 208 lb 3.2 oz (94.4 kg)  12/06/23 210 lb 9.6 oz (95.5 kg)   WJ:XBJYN were no vitals filed for this visit.,There is no height or weight on file to calculate BMI. GEN: Well nourished, well developed in no acute distress Neck: No JVD; No carotid bruits Pulmonary: Clear to auscultation without rales, wheezing or rhonchi  Cardiovascular: Normal rate. Regular rhythm. Normal S1. Normal S2.   Murmurs: There is no murmur.  ABDOMEN: Soft, non-tender, non-distended EXTREMITIES:  No  edema; No deformity   EKG/LABS/ Recent Cardiac Studies   ECG personally reviewed by me today - ***  Risk Assessment/Calculations:   {Does this patient have ATRIAL FIBRILLATION?:[id number]}      Lab Results  Component Value Date   WBC 2.8 (L) 12/06/2023   HGB 11.1 (L) 12/06/2023   HCT 35.4 (L) 12/06/2023   MCV 97.3 12/06/2023   PLT 220 12/06/2023   Lab Results  Component Value Date   CREATININE 1.59 (H) 12/06/2023   BUN 22 12/06/2023   NA 140 12/06/2023   K 4.9 12/06/2023   CL 105 12/06/2023   CO2 27 12/06/2023   Lab Results  Component Value Date   CHOL 112 11/21/2023   HDL 38.60 (L) 11/21/2023   LDLCALC 20 11/21/2023   TRIG 266.0 (H) 11/21/2023   CHOLHDL 3 11/21/2023     Lab Results  Component Value Date   HGBA1C 6.2 11/21/2023   Assessment & Plan    Assessment and Plan Assessment & Plan     1.  Essential hypertension  2.  Shortness of breath  3.  Sick sinus syndrome:  4.  History of OSA: - Not on CPAP      Disposition: Follow-up with Cody Das, MD or APP in *** months {Are you ordering a CV Procedure (e.g. stress test, cath, DCCV, TEE, etc)?   Press F2        :[id number]}   Signed, Francene Ing, Retha Cast, NP 01/05/2024, 11:30 AM [facility]

## 2024-01-06 ENCOUNTER — Ambulatory Visit: Admitting: Nurse Practitioner

## 2024-01-06 DIAGNOSIS — I10 Essential (primary) hypertension: Secondary | ICD-10-CM

## 2024-01-06 DIAGNOSIS — G4733 Obstructive sleep apnea (adult) (pediatric): Secondary | ICD-10-CM

## 2024-01-06 DIAGNOSIS — I495 Sick sinus syndrome: Secondary | ICD-10-CM

## 2024-01-06 DIAGNOSIS — R0602 Shortness of breath: Secondary | ICD-10-CM

## 2024-01-15 ENCOUNTER — Ambulatory Visit (HOSPITAL_COMMUNITY)
Admission: RE | Admit: 2024-01-15 | Discharge: 2024-01-15 | Disposition: A | Discharge status: Home / Self Care | Source: Ambulatory Visit | Attending: Cardiovascular Disease | Admitting: Cardiovascular Disease

## 2024-01-15 DIAGNOSIS — I5033 Acute on chronic diastolic (congestive) heart failure: Secondary | ICD-10-CM

## 2024-01-16 ENCOUNTER — Encounter: Payer: Self-pay | Admitting: Nurse Practitioner

## 2024-01-16 ENCOUNTER — Ambulatory Visit: Payer: Self-pay | Admitting: Cardiology

## 2024-01-16 ENCOUNTER — Ambulatory Visit (INDEPENDENT_AMBULATORY_CARE_PROVIDER_SITE_OTHER): Admitting: Nurse Practitioner

## 2024-01-16 VITALS — BP 113/57 | HR 81 | Temp 97.7°F | Ht 64.0 in | Wt 205.2 lb

## 2024-01-16 DIAGNOSIS — M25561 Pain in right knee: Secondary | ICD-10-CM

## 2024-01-16 DIAGNOSIS — G8929 Other chronic pain: Secondary | ICD-10-CM

## 2024-01-16 DIAGNOSIS — T148XXA Other injury of unspecified body region, initial encounter: Secondary | ICD-10-CM

## 2024-01-16 LAB — ECHOCARDIOGRAM COMPLETE
Area-P 1/2: 2.16 cm2
S' Lateral: 2.44 cm

## 2024-01-16 NOTE — Patient Instructions (Signed)
 It was great to see you!  Get a knee compression sleeve and wear it during the day - you can get this at any pharmacy, or [employer]   Use ice for 10-15 minutes at a time 4 times a day  I have placed a referral to ortho - they will call you  Let's follow-up if your symptoms worsen or don't improve   Take care,  Rheba Cedar, NP

## 2024-01-16 NOTE — Progress Notes (Unsigned)
   Acute Office Visit  Subjective:     Patient ID: Cathy Richardson, female    DOB: 1941-07-26, 83 y.o.   MRN: [id number]  Chief Complaint  Patient presents with   Knee Injury    Cathy Richardson on Monday hurt right knee, broken skin on the right side.    HPI Patient is in today for ***  ROS      Objective:    BP (!) 113/57 (BP Location: Left Arm, Patient Position: Sitting, Cuff Size: Normal)   Pulse 81   Temp 97.7 F (36.5 C) (Temporal)   Ht 5' 4 (1.626 m)   Wt 205 lb 3.2 oz (93.1 kg)   SpO2 98%   BMI 35.22 kg/m  {Vitals History (Optional):23777}  Physical Exam  No results found for any visits on 01/16/24.      Assessment & Plan:   Problem List Items Addressed This Visit   None   No orders of the defined types were placed in this encounter.   No follow-ups on file.  Cathy Benjamin, NP

## 2024-01-16 NOTE — Progress Notes (Signed)
 Seeing you on 6/24. Aortic sclerosis, grade 1 DD, rest normal.  Thanks MJP

## 2024-01-17 DIAGNOSIS — G8929 Other chronic pain: Secondary | ICD-10-CM | POA: Insufficient documentation

## 2024-01-17 NOTE — Assessment & Plan Note (Signed)
 Acute on chronic pain following a fall, worsened by movement. Generalized pain with palpation of anterior knee. Declines x-ray today. Use ice and heat therapy, and obtain a knee compression sleeve for support and pain relief. Elevate the leg when sitting. Refer to orthopedics for further evaluation and management if necessary. She was told that she needed a right knee replacement in the past.

## 2024-01-20 ENCOUNTER — Other Ambulatory Visit: Payer: Self-pay

## 2024-01-20 MED ORDER — METOPROLOL TARTRATE 100 MG PO TABS: 100.0000 mg | ORAL_TABLET | Freq: Two times a day (BID) | ORAL | 3 refills | Status: DC

## 2024-01-20 NOTE — Progress Notes (Unsigned)
 Cardiology Office Note    Patient Name: Cathy Richardson Date of Encounter: 01/20/2024  Primary Care Provider:  Thedora Garnette HERO, MD Primary Cardiologist:  Newman JINNY Lawrence, MD Primary Electrophysiologist: None   Past Medical History    Past Medical History:  Diagnosis Date   Anemia    Arthritis    Asthma    Cancer [facility])    History of Renal Cancer   Chronic back pain    Depression    Diabetes mellitus without complication (HCC)    Excessive somnolence disorder    Family history of adverse reaction to anesthesia    son has problems with nausea    GERD (gastroesophageal reflux disease)    Glaucoma    pt denies on preop visit of 01/03/2015    Headache    Heart murmur    High cholesterol    History of hiatal hernia    Hypertension    Osteoporosis    Peptic ulcer    Renal disorder    only one kidney    Sleep apnea    cpap- does not know settings    Urinary tract infection    hx     History of Present Illness  Cathy Richardson is a 83 y.o. female with a PMH of sick sinus syndrome s/p Medtronic dual-chamber PPM, paroxysmal atrial tachycardia, OSA, HTN, HLD who presents today for 1 month follow-up.  Ms. Nadeau was seen initially by Dr. Patwarhdan for initial visit and to establish care.  She had been seen recently in the emergency room with plaint of constant chest pressure and shortness of breath.  She underwent a workup for ACS that was negative with mildly elevated BNP.  She was previously followed by Dr. Anderson for cardiology in [facility].  During her visit she reported indiscretions with salt and blood pressure was stable at 122/56.  She was found to have 2+ pitting edema in her right lower extremity and 1+ in the left lower extremity.  She has been taking Lasix  20 mg since her ED visit which was increased to 40 mg and patient underwent a 2D echo is scheduled to be completed on 01/15/2024.  She was referred to EP to establish care and was seen by Dr. Nancey on 12/30/2023 and  during visit blood pressure was well-controlled and patient denied any device related problems.  Device interrogation completed showing device dependent with no recommendations or changes.   Patient denies chest pain, palpitations, dyspnea, PND, orthopnea, nausea, vomiting, dizziness, syncope, edema, weight gain, or early satiety.   Discussed the use of AI scribe software for clinical note transcription with the patient, who gave verbal consent to proceed.  History of Present Illness    ***Notes:   Review of Systems  Please see the history of present illness.    All other systems reviewed and are otherwise negative except as noted above.  Physical Exam     Wt Readings from Last 3 Encounters:  01/16/24 205 lb 3.2 oz (93.1 kg)  12/30/23 208 lb (94.3 kg)  12/10/23 208 lb 3.2 oz (94.4 kg)   CD:Uyzmz were no vitals filed for this visit.,There is no height or weight on file to calculate BMI. GEN: Well nourished, well developed in no acute distress Neck: No JVD; No carotid bruits Pulmonary: Clear to auscultation without rales, wheezing or rhonchi  Cardiovascular: Normal rate. Regular rhythm. Normal S1. Normal S2.   Murmurs: There is no murmur.  ABDOMEN: Soft, non-tender, non-distended EXTREMITIES:  No  edema; No deformity   EKG/LABS/ Recent Cardiac Studies   ECG personally reviewed by me today - ***  Risk Assessment/Calculations:   {Does this patient have ATRIAL FIBRILLATION?:[id number]}      Lab Results  Component Value Date   WBC 2.8 (L) 12/06/2023   HGB 11.1 (L) 12/06/2023   HCT 35.4 (L) 12/06/2023   MCV 97.3 12/06/2023   PLT 220 12/06/2023   Lab Results  Component Value Date   CREATININE 1.59 (H) 12/06/2023   BUN 22 12/06/2023   NA 140 12/06/2023   K 4.9 12/06/2023   CL 105 12/06/2023   CO2 27 12/06/2023   Lab Results  Component Value Date   CHOL 112 11/21/2023   HDL 38.60 (L) 11/21/2023   LDLCALC 20 11/21/2023   TRIG 266.0 (H) 11/21/2023   CHOLHDL 3  11/21/2023    Lab Results  Component Value Date   HGBA1C 6.2 11/21/2023   Assessment & Plan    Assessment and Plan Assessment & Plan     1.  Essential hypertension:  2.  HFpEF:  3.  Sick sinus syndrome:  4.  History of OSA: - Not on CPAP      Disposition: Follow-up with Newman JINNY Lawrence, MD or APP in *** months {Are you ordering a CV Procedure (e.g. stress test, cath, DCCV, TEE, etc)?   Press F2        :[id number]}   Signed, Wyn Raddle, Jackee Shove, NP 01/20/2024, 7:33 PM [facility]

## 2024-01-20 NOTE — Telephone Encounter (Signed)
 Refill request for  Metoprolol tartrate 100 mg LR  HX provider LOV 11/21/23 FOV  02/20/24  Please review and advise. Thanks. Dm/cma

## 2024-01-21 ENCOUNTER — Ambulatory Visit: Attending: Nurse Practitioner | Admitting: Nurse Practitioner

## 2024-01-21 ENCOUNTER — Encounter: Payer: Self-pay | Admitting: Nurse Practitioner

## 2024-01-21 ENCOUNTER — Telehealth: Payer: Self-pay

## 2024-01-21 ENCOUNTER — Ambulatory Visit (INDEPENDENT_AMBULATORY_CARE_PROVIDER_SITE_OTHER)
Admission: RE | Admit: 2024-01-21 | Discharge: 2024-01-21 | Disposition: A | Discharge status: Home / Self Care | Source: Ambulatory Visit | Attending: Nurse Practitioner | Admitting: Nurse Practitioner

## 2024-01-21 VITALS — BP 108/54 | HR 64 | Ht 64.0 in | Wt 207.0 lb

## 2024-01-21 DIAGNOSIS — G4733 Obstructive sleep apnea (adult) (pediatric): Secondary | ICD-10-CM | POA: Insufficient documentation

## 2024-01-21 DIAGNOSIS — I5033 Acute on chronic diastolic (congestive) heart failure: Secondary | ICD-10-CM | POA: Diagnosis present

## 2024-01-21 DIAGNOSIS — M25561 Pain in right knee: Secondary | ICD-10-CM | POA: Diagnosis not present

## 2024-01-21 DIAGNOSIS — I10 Essential (primary) hypertension: Secondary | ICD-10-CM | POA: Diagnosis not present

## 2024-01-21 DIAGNOSIS — G8929 Other chronic pain: Secondary | ICD-10-CM

## 2024-01-21 DIAGNOSIS — I495 Sick sinus syndrome: Secondary | ICD-10-CM | POA: Diagnosis present

## 2024-01-21 MED ORDER — POTASSIUM CHLORIDE ER 10 MEQ PO TBCR: 10.0000 meq | EXTENDED_RELEASE_TABLET | Freq: Every day | ORAL | 0 refills | Status: AC

## 2024-01-21 NOTE — Patient Instructions (Signed)
 Medication Instructions:  Your physician recommends that you continue on your current medications as directed. Please refer to the Current Medication list given to you today.  *If you need a refill on your cardiac medications before your next appointment, please call your pharmacy*  Lab Work: Today: BMET, MAG, BNP If you have labs (blood work) drawn today and your tests are completely normal, you will receive your results only by: MyChart Message (if you have MyChart) OR A paper copy in the mail If you have any lab test that is abnormal or we need to change your treatment, we will call you to review the results.  Testing/Procedures: None  Follow-Up: At [facility], you and your health needs are our priority.  As part of our continuing mission to provide you with exceptional heart care, our providers are all part of one team.  This team includes your primary Cardiologist (physician) and Advanced Practice Providers or APPs (Physician Assistants and Nurse Practitioners) who all work together to provide you with the care you need, when you need it.  Your next appointment:   6 month(s)  Provider:   Newman JINNY Lawrence, MD    We recommend signing up for the patient portal called MyChart.  Sign up information is provided on this After Visit Summary.  MyChart is used to connect with patients for Virtual Visits (Telemedicine).  Patients are able to view lab/test results, encounter notes, upcoming appointments, etc.  Non-urgent messages can be sent to your provider as well.   To learn more about what you can do with MyChart, go to [email or web address].   Other Instructions

## 2024-01-21 NOTE — Telephone Encounter (Signed)
 Left VM to rtn call to schedule an appointment.  Dm/cma

## 2024-01-21 NOTE — Telephone Encounter (Signed)
 Patient notified VIA phone of address and information.  Dm/cma

## 2024-01-21 NOTE — Addendum Note (Signed)
 Addended by: Kelee Cunningham A on: 01/21/2024 11:55 AM   Modules accepted: Orders

## 2024-01-21 NOTE — Telephone Encounter (Signed)
 Can you order the x-ray?

## 2024-01-21 NOTE — Telephone Encounter (Signed)
 Copied from CRM [id number]. Topic: Clinical - Request for Lab/Test Order >> Jan 21, 2024  8:46 AM Zenovia PARAS wrote: Reason for CRM: Patient requesting order to get xray of right knee. Patient would like to get it done today. Please contact

## 2024-01-22 ENCOUNTER — Ambulatory Visit: Admitting: Nurse Practitioner

## 2024-01-22 ENCOUNTER — Ambulatory Visit: Payer: Self-pay | Admitting: Nurse Practitioner

## 2024-01-22 DIAGNOSIS — I5033 Acute on chronic diastolic (congestive) heart failure: Secondary | ICD-10-CM

## 2024-01-22 LAB — PRO B NATRIURETIC PEPTIDE: NT-Pro BNP: 275 pg/mL (ref 0–738)

## 2024-01-22 LAB — BASIC METABOLIC PANEL WITH GFR
BUN/Creatinine Ratio: 13 (ref 12–28)
BUN: 28 mg/dL — ABNORMAL HIGH (ref 8–27)
CO2: 24 mmol/L (ref 20–29)
Calcium: 9.5 mg/dL (ref 8.7–10.3)
Chloride: 100 mmol/L (ref 96–106)
Creatinine, Ser: 2.19 mg/dL — ABNORMAL HIGH (ref 0.57–1.00)
Glucose: 92 mg/dL (ref 70–99)
Potassium: 4.6 mmol/L (ref 3.5–5.2)
Sodium: 140 mmol/L (ref 134–144)
eGFR: 22 mL/min/{1.73_m2} — ABNORMAL LOW (ref >59)

## 2024-01-22 LAB — MAGNESIUM: Magnesium: 2.8 mg/dL — ABNORMAL HIGH (ref 1.6–2.3)

## 2024-01-29 ENCOUNTER — Ambulatory Visit: Payer: Self-pay | Admitting: Nurse Practitioner

## 2024-02-04 ENCOUNTER — Ambulatory Visit (INDEPENDENT_AMBULATORY_CARE_PROVIDER_SITE_OTHER): Admitting: Orthopaedic Surgery

## 2024-02-04 DIAGNOSIS — M1711 Unilateral primary osteoarthritis, right knee: Secondary | ICD-10-CM | POA: Diagnosis not present

## 2024-02-04 MED ORDER — METHYLPREDNISOLONE ACETATE 40 MG/ML IJ SUSP
40.0000 mg | INTRAMUSCULAR | Status: AC | PRN
  Administered 2024-02-04: 40 mg via INTRA_ARTICULAR

## 2024-02-04 MED ORDER — LIDOCAINE HCL 1 % IJ SOLN
2.0000 mL | INTRAMUSCULAR | Status: AC | PRN
  Administered 2024-02-04: 2 mL

## 2024-02-04 MED ORDER — BUPIVACAINE HCL 0.5 % IJ SOLN
2.0000 mL | INTRAMUSCULAR | Status: AC | PRN
  Administered 2024-02-04: 2 mL via INTRA_ARTICULAR

## 2024-02-04 NOTE — Progress Notes (Signed)
 Office Visit Note   Patient: Cathy Richardson           Date of Birth: 1941/02/16           MRN: [id number] Visit Date: 02/04/2024              Requested by: Nedra Tinnie LABOR, NP [address] [place],  [place] [address] PCP: Thedora Garnette HERO, MD   Assessment & Plan: Visit Diagnoses:  1. Primary osteoarthritis of right knee     Plan: History of Present Illness Cathy Richardson is an 83 year old female with osteoporosis who presents with worsening right knee pain after a fall.  She has experienced right knee pain for several years, which worsened after a fall one to two weeks ago, resulting in constant pain. She uses a cane for mobility. X-rays obtained on June 24th showed no acute abnormalities such as fractures or dislocations. She experiences constant pain in the right knee without specific tenderness along the sides of the knee. Past cortisone injections provided temporary relief, and a previously scheduled knee replacement surgery was canceled due to the COVID-19 pandemic.  Physical Exam MUSCULOSKELETAL: Right knee with possible effusion. Right knee ligaments intact.  Results RADIOLOGY Right knee X-ray: No acute abnormalities, no fracture or dislocation (01/21/2024)  Assessment and Plan Knee osteoarthritis Chronic right knee osteoarthritis exacerbated by recent fall. X-rays show no acute abnormalities. Previous cortisone injections provided temporary relief. Surgery considered but canceled due to COVID-19. - Administer cortisone injection to right knee. - Consider surgery as last resort due to age and functional level. - Discuss alternative treatment with anti-inflammatory medication, not preferred by her.  Follow-Up Instructions: No follow-ups on file.   Orders:  No orders of the defined types were placed in this encounter.  No orders of the defined types were placed in this encounter.     Procedures: Large Joint Inj: R knee on 02/04/2024 12:26 PM Indications:  pain Details: 22 G needle  Arthrogram: No  Medications: 40 mg methylPREDNISolone  acetate 40 MG/ML; 2 mL lidocaine  1 %; 2 mL bupivacaine  0.5 % Consent was given by the patient. Patient was prepped and draped in the usual sterile fashion.       Clinical Data: No additional findings.   Subjective: Chief Complaint  Patient presents with   Right Knee - Pain    HPI  Review of Systems  Constitutional: Negative.   HENT: Negative.    Eyes: Negative.   Respiratory: Negative.    Cardiovascular: Negative.   Endocrine: Negative.   Musculoskeletal: Negative.   Neurological: Negative.   Hematological: Negative.   Psychiatric/Behavioral: Negative.    All other systems reviewed and are negative.    Objective: Vital Signs: There were no vitals taken for this visit.  Physical Exam Vitals and nursing note reviewed.  Constitutional:      Appearance: She is well-developed.  HENT:     Head: Atraumatic.     Nose: Nose normal.  Eyes:     Extraocular Movements: Extraocular movements intact.  Cardiovascular:     Pulses: Normal pulses.  Pulmonary:     Effort: Pulmonary effort is normal.  Abdominal:     Palpations: Abdomen is soft.  Musculoskeletal:     Cervical back: Neck supple.  Skin:    General: Skin is warm.     Capillary Refill: Capillary refill takes less than 2 seconds.  Neurological:     Mental Status: She is alert. Mental status is at baseline.  Psychiatric:        Behavior: Behavior normal.        Thought Content: Thought content normal.        Judgment: Judgment normal.     Ortho Exam  Specialty Comments:  No specialty comments available.  Imaging: No results found.   PMFS History: Patient Active Problem List   Diagnosis Date Noted   Chronic pain of right knee 01/17/2024   Hammertoe 11/21/2023   Hallux valgus 11/21/2023   Intertrigo 11/21/2023   Chronic kidney disease, stage 4 (severe) (HCC) 11/21/2023   Chronic nausea 06/03/2023   DISH (diffuse  idiopathic skeletal hyperostosis) 01/08/2023   SSS (sick sinus syndrome) (HCC) 10/01/2022   Opioid use, unspecified with unspecified opioid-induced disorder (HCC) 05/02/2022   Chronic right shoulder pain 08/21/2021   OSA on CPAP 05/29/2021   Solitary kidney, acquired 07/18/2020   Chronic fatigue 11/26/2019   Major depression in remission (HCC) 01/29/2019   Mixed hyperlipidemia 09/05/2018   RLS (restless legs syndrome) 09/05/2018   Class 2 severe obesity due to excess calories with serious comorbidity and body mass index (BMI) of 38.0 to 38.9 in adult (HCC) 09/05/2018   Type 2 diabetes mellitus with diabetic chronic kidney disease (HCC) 09/05/2018   Chronic bilateral low back pain without sciatica 05/26/2018   History of colon cancer, stage I 12/13/2015   Adenocarcinoma of transverse colon (HCC) 11/12/2015   GERD (gastroesophageal reflux disease) 11/12/2015   Vitamin D  deficiency 11/12/2015   Chronic pain syndrome 11/12/2015   Asthma 11/12/2015   Leg edema 01/05/2015   Essential hypertension 06/17/2013   Personal history of renal cancer 06/17/2013   OA (osteoarthritis) 06/17/2013   Mixed incontinence 06/17/2013   Lumbar spondylosis 06/17/2013   Age-related osteoporosis without current pathological fracture 06/17/2013   Past Medical History:  Diagnosis Date   Anemia    Arthritis    Asthma    Cancer (HCC)    History of Renal Cancer   Chronic back pain    Depression    Diabetes mellitus without complication (HCC)    Excessive somnolence disorder    Family history of adverse reaction to anesthesia    son has problems with nausea    GERD (gastroesophageal reflux disease)    Glaucoma    pt denies on preop visit of 01/03/2015    Headache    Heart murmur    High cholesterol    History of hiatal hernia    Hypertension    Osteoporosis    Peptic ulcer    Renal disorder    only one kidney    Sleep apnea    cpap- does not know settings    Urinary tract infection    hx      Family History  Problem Relation Age of Onset   Stroke Mother    Diabetes Mother    Hypertension Mother    Stroke Father    Hypertension Father    Diabetes Daughter    Cancer Daughter        Cervical   Cancer Son        Colon   Cancer Son        Colon    Past Surgical History:  Procedure Laterality Date   bowel obstruction surgery      x 2    Carpule Tunnel Release Bilateral    CHOLECYSTECTOMY     KNEE ARTHROCENTESIS     left knee   NEPHRECTOMY     PACEMAKER IMPLANT  Removal of Skin Cancer  N/A    Forehead   SKIN SPLIT GRAFT N/A 01/05/2015   Procedure: MANDIBULAR LINGUAL SPLIT THICKNESS SKIN GRAFT VESTIVULOPLASTY;  Surgeon: Elsie CHRISTELLA Daring, DDS;  Location: [facility];  Service: Oral Surgery;  Laterality: N/A;   TOTAL KNEE ARTHROPLASTY Left    left knee replacement    Social History   Occupational History   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

## 2024-02-06 ENCOUNTER — Other Ambulatory Visit: Payer: Self-pay

## 2024-02-06 DIAGNOSIS — I5033 Acute on chronic diastolic (congestive) heart failure: Secondary | ICD-10-CM

## 2024-02-06 LAB — BASIC METABOLIC PANEL WITH GFR
BUN/Creatinine Ratio: 20 (ref 12–28)
BUN: 37 mg/dL — ABNORMAL HIGH (ref 8–27)
CO2: 23 mmol/L (ref 20–29)
Calcium: 9.5 mg/dL (ref 8.7–10.3)
Chloride: 103 mmol/L (ref 96–106)
Creatinine, Ser: 1.83 mg/dL — ABNORMAL HIGH (ref 0.57–1.00)
Glucose: 95 mg/dL (ref 70–99)
Potassium: 5.2 mmol/L (ref 3.5–5.2)
Sodium: 140 mmol/L (ref 134–144)
eGFR: 27 mL/min/1.73 — ABNORMAL LOW (ref >59)

## 2024-02-07 ENCOUNTER — Ambulatory Visit: Payer: Self-pay | Admitting: Nurse Practitioner

## 2024-02-07 DIAGNOSIS — R600 Localized edema: Secondary | ICD-10-CM

## 2024-02-07 DIAGNOSIS — I5033 Acute on chronic diastolic (congestive) heart failure: Secondary | ICD-10-CM

## 2024-02-07 MED ORDER — FUROSEMIDE 20 MG PO TABS: 20.0000 mg | ORAL_TABLET | Freq: Every day | ORAL | Status: DC | PRN

## 2024-02-20 ENCOUNTER — Ambulatory Visit: Admitting: Family Medicine

## 2024-02-20 ENCOUNTER — Encounter: Payer: Self-pay | Admitting: Family Medicine

## 2024-02-20 ENCOUNTER — Ambulatory Visit: Payer: Self-pay | Admitting: Family Medicine

## 2024-02-20 VITALS — BP 126/64 | HR 61 | Temp 98.8°F | Ht 64.0 in | Wt 207.0 lb

## 2024-02-20 DIAGNOSIS — E1122 Type 2 diabetes mellitus with diabetic chronic kidney disease: Secondary | ICD-10-CM | POA: Diagnosis not present

## 2024-02-20 DIAGNOSIS — N184 Chronic kidney disease, stage 4 (severe): Secondary | ICD-10-CM | POA: Diagnosis not present

## 2024-02-20 DIAGNOSIS — G629 Polyneuropathy, unspecified: Secondary | ICD-10-CM | POA: Diagnosis not present

## 2024-02-20 DIAGNOSIS — J309 Allergic rhinitis, unspecified: Secondary | ICD-10-CM | POA: Insufficient documentation

## 2024-02-20 DIAGNOSIS — I5032 Chronic diastolic (congestive) heart failure: Secondary | ICD-10-CM | POA: Diagnosis not present

## 2024-02-20 DIAGNOSIS — E782 Mixed hyperlipidemia: Secondary | ICD-10-CM | POA: Diagnosis not present

## 2024-02-20 DIAGNOSIS — I10 Essential (primary) hypertension: Secondary | ICD-10-CM | POA: Diagnosis not present

## 2024-02-20 DIAGNOSIS — J301 Allergic rhinitis due to pollen: Secondary | ICD-10-CM | POA: Diagnosis not present

## 2024-02-20 DIAGNOSIS — Z7984 Long term (current) use of oral hypoglycemic drugs: Secondary | ICD-10-CM

## 2024-02-20 DIAGNOSIS — R197 Diarrhea, unspecified: Secondary | ICD-10-CM

## 2024-02-20 DIAGNOSIS — I5030 Unspecified diastolic (congestive) heart failure: Secondary | ICD-10-CM | POA: Insufficient documentation

## 2024-02-20 LAB — VITAMIN B12: Vitamin B-12: 290 pg/mL (ref 211–911)

## 2024-02-20 LAB — GLUCOSE, RANDOM: Glucose, Bld: 95 mg/dL (ref 70–99)

## 2024-02-20 LAB — HEMOGLOBIN A1C: Hgb A1c MFr Bld: 6.4 % (ref 4.6–6.5)

## 2024-02-20 MED ORDER — EMPAGLIFLOZIN 10 MG PO TABS: 10.0000 mg | ORAL_TABLET | Freq: Every day | ORAL | 2 refills | Status: DC

## 2024-02-20 NOTE — Assessment & Plan Note (Signed)
 Cathy Richardson is followed by nephrology. Continue focus on blood pressure and glucose control, adequate hydration, and avoidance of nephrotoxic medications. Continue ACE-I and start an SGLT2i.

## 2024-02-20 NOTE — Progress Notes (Signed)
 [facility] [address] [place] [address] Dept: [phone] Dept Fax: [phone]  Chronic Care Office Visit  Subjective:    Patient ID: Cathy Richardson, female    DOB: 10/07/1940, 83 y.o..   MRN: [id number]  Chief Complaint  Patient presents with   Follow-up    Been feeling bad since Saturday; nausea, diarrhea, and feeling weak in hands in body; no energy, feel heavy, weighed down; and normally would have small legs but think they may be swelling   History of Present Illness:  Patient is in today for reassessment of chronic medical issues.  Cathy Richardson has a history of hyperlipidemia. She is managed on atorvastatin  20 mg daily.   Cathy Richardson has a history of hypertension. She is managed on lisinopril 10 mg daily.   Cathy Richardson has a history of Type 2 diabetes. She is managed on metformin  500 mg daily.   Cathy Richardson has a history of a sick sinus syndrome. She has a pacemaker. She is also on metoprolol  tartrate 100 mg bid. Since her last visit with me, she was seen with chest pain and lower leg edema by Dr. Sebastian, who referred her to the ED. She was found to have acute on chronic heart failure with preserved ejection fraction. She has received furosemide  20 mg which she takes intermittently. She notes she has a small amount of edema today.  Cathy Richardson has a history of allergic rhinitis. She uses fluticasone  nasal spray daily. She has been having some fullness of her ears and can hear her pulse in the right ear.  Cathy Richardson notes that for the past 4 days she has had some nausea, episodes of diarrhea with fecal incontinence at night. She states the diarrhea has been more chronic, but she had never had this happen twice int he same night.  Cathy Richardson notes she has been experiencing some weakness in her hands, to where she will suddenly drop her cell phone. She also finds that when typing on her cell phone, she finds she is striking  letters twice, as it the movement makes a second beat. She has not experienced other signs of nerve or muscular issues.   Past Medical History: Patient Active Problem List   Diagnosis Date Noted   Heart failure with preserved ejection fraction (HCC) 02/20/2024   Chronic pain of right knee 01/17/2024   Hammertoe 11/21/2023   Hallux valgus 11/21/2023   Intertrigo 11/21/2023   Chronic kidney disease, stage 4 (severe) (HCC) 11/21/2023   Chronic nausea 06/03/2023   DISH (diffuse idiopathic skeletal hyperostosis) 01/08/2023   SSS (sick sinus syndrome) (HCC) 10/01/2022   Opioid use, unspecified with unspecified opioid-induced disorder (HCC) 05/02/2022   Chronic right shoulder pain 08/21/2021   OSA on CPAP 05/29/2021   Solitary kidney, acquired 07/18/2020   Chronic fatigue 11/26/2019   Major depression in remission (HCC) 01/29/2019   Mixed hyperlipidemia 09/05/2018   RLS (restless legs syndrome) 09/05/2018   Class 2 severe obesity due to excess calories with serious comorbidity and body mass index (BMI) of 38.0 to 38.9 in adult [facility]) 09/05/2018   Type 2 diabetes mellitus with diabetic chronic kidney disease (HCC) 09/05/2018   Chronic bilateral low back pain without sciatica 05/26/2018   History of colon cancer, stage I 12/13/2015   Adenocarcinoma of transverse colon (HCC) 11/12/2015   GERD (gastroesophageal reflux disease) 11/12/2015   Vitamin D  deficiency 11/12/2015   Chronic pain syndrome 11/12/2015   Asthma 11/12/2015  Leg edema 01/05/2015   Essential hypertension 06/17/2013   Personal history of renal cancer 06/17/2013   OA (osteoarthritis) 06/17/2013   Mixed incontinence 06/17/2013   Lumbar spondylosis 06/17/2013   Age-related osteoporosis without current pathological fracture 06/17/2013   Past Surgical History:  Procedure Laterality Date   bowel obstruction surgery      x 2    Carpule Tunnel Release Bilateral    CHOLECYSTECTOMY     KNEE ARTHROCENTESIS     left knee    NEPHRECTOMY     PACEMAKER IMPLANT     Removal of Skin Cancer  N/A    Forehead   SKIN SPLIT GRAFT N/A 01/05/2015   Procedure: MANDIBULAR LINGUAL SPLIT THICKNESS SKIN GRAFT VESTIVULOPLASTY;  Surgeon: Elsie CHRISTELLA Daring, DDS;  Location: [facility];  Service: Oral Surgery;  Laterality: N/A;   TOTAL KNEE ARTHROPLASTY Left    left knee replacement    Family History  Problem Relation Age of Onset   Stroke Mother    Diabetes Mother    Hypertension Mother    Stroke Father    Hypertension Father    Diabetes Daughter    Cancer Daughter        Cervical   Cancer Son        Colon   Cancer Son        Colon   Outpatient Medications Prior to Visit  Medication Sig Dispense Refill   albuterol  (PROVENTIL ) (2.5 MG/3ML) 0.083% nebulizer solution Take 3 mLs (2.5 mg total) by nebulization every 6 (six) hours as needed for wheezing or shortness of breath. 75 mL 12   atorvastatin  (LIPITOR) 20 MG tablet Take 20 mg by mouth at bedtime.     budesonide -formoterol  (SYMBICORT ) 160-4.5 MCG/ACT inhaler Inhale 2 puffs into the lungs 2 (two) times daily. 1 each 3   calcium  carbonate (TUMS - DOSED IN MG ELEMENTAL CALCIUM ) 500 MG chewable tablet Chew 2 tablets by mouth every morning.     Cholecalciferol  50 MCG (2000 UT) TABS Take 2,000 Units by mouth daily.     cyclobenzaprine  (FLEXERIL ) 10 MG tablet Take 10 mg by mouth daily as needed for muscle spasms.     escitalopram  (LEXAPRO ) 20 MG tablet Take 20 mg by mouth daily.     fentaNYL  (DURAGESIC ) 25 MCG/HR Place 1 patch onto the skin every 3 (three) days.     fluticasone  (FLONASE ) 50 MCG/ACT nasal spray Place 2 sprays into both nostrils daily.      furosemide  (LASIX ) 20 MG tablet Take 1 tablet (20 mg total) by mouth daily as needed for fluid (weight gain greater than 2 lbs in 24 hrs or 5 lbs in 1 week.).     HYDROcodone-acetaminophen  (NORCO/VICODIN) 5-325 MG tablet Take 1 tablet by mouth every 12 (twelve) hours as needed.     latanoprost (XALATAN) 0.005 % ophthalmic solution  1 drop 4 (four) times daily.     lisinopril (ZESTRIL) 10 MG tablet Take 10 tablets by mouth daily.     metoprolol  tartrate (LOPRESSOR ) 100 MG tablet Take 1 tablet (100 mg total) by mouth 2 (two) times daily. 180 tablet 3   Multiple Vitamin (MULTIVITAMIN) tablet Take 1 tablet by mouth daily.     Omega-3 Fatty Acids (FISH OIL) 1000 MG CAPS Take 1,000 mg by mouth daily.     pantoprazole  (PROTONIX ) 40 MG tablet Take 40 mg by mouth.     potassium chloride  (KLOR-CON ) 10 MEQ tablet Take 1 tablet (10 mEq total) by mouth daily. 90 tablet 0  pramipexole  (MIRAPEX ) 0.125 MG tablet Take 0.125-0.25 mg by mouth See admin instructions. Taking one or two tablets at bedtime for restless leg     pregabalin  (LYRICA ) 25 MG capsule Take 25 mg by mouth in the morning and at bedtime.     silver  sulfADIAZINE  (SILVADENE ) 1 % cream Apply 1 Application topically 2 (two) times daily as needed (rash). 50 g 5   traMADol (ULTRAM) 50 MG tablet Take 1 tablet by mouth every 8 (eight) hours as needed.     metFORMIN  (GLUCOPHAGE -XR) 500 MG 24 hr tablet Take 500 mg by mouth daily before supper.     No facility-administered medications prior to visit.   Allergies  Allergen Reactions   Gabapentin Shortness Of Breath   Fluoxetine     Other reaction(s): Other (See Comments) Severe headache   Iodine-131 Itching and Swelling    And swollen eyelids    Naproxen Itching   Reglan  [Metoclopramide ] Other (See Comments)    dizzy   Tizanidine Other (See Comments)    Confusion    Ivp Dye [Iodinated Contrast Media] Rash   Objective:   Today's Vitals   02/20/24 0832  BP: 126/64  Pulse: 61  Temp: 98.8 F (37.1 C)  SpO2: 93%  Weight: 207 lb (93.9 kg)  Height: 5' 4 (1.626 m)   Body mass index is 35.53 kg/m.   General: Well developed, well nourished. No acute distress. HEENT: Normocephalic, non-traumatic. External ears normal. Right EAC and TMs normal. CV: RRR without murmurs or rubs. Pulses 2+ bilaterally. Extremities: Trace  edema. Feet- Skin intact. No sign of maceration between toes. Bilateral bunions and hammer toes. Nails are normal.   Dorsalis pedis and posterior tibial artery pulses are normal. 5.07 monofilament testing normal. Psych: Alert and oriented. Normal mood and affect.  Health Maintenance Due  Topic Date Due   FOOT EXAM  Never done   Zoster Vaccines- Shingrix (1 of 2) Never done   DTaP/Tdap/Td (2 - Td or Tdap) 02/04/2021   Medicare Annual Wellness (AWV)  06/07/2021   COVID-19 Vaccine (6 - 2024-25 season) 03/31/2023   Imaging: Echocardiogram ( 01/15/2024) IMPRESSIONS   1. Left ventricular ejection fraction, by estimation, is 60 to 65%. Left ventricular ejection fraction by 3D volume is 58 %. The left ventricle has normal function. The left ventricle has no regional wall motion abnormalities. Left ventricular diastolic parameters are consistent with Grade I diastolic dysfunction (impaired relaxation). The average left ventricular global longitudinal strain is -25.3 %. The global longitudinal strain is normal.   2. Right ventricular systolic function is normal. The right ventricular size is normal.   3. The mitral valve is normal in structure. Trivial mitral valve regurgitation. No evidence of mitral stenosis.   4. The aortic valve is tricuspid. There is mild calcification of the aortic valve. Aortic valve regurgitation is not visualized. Aortic valve sclerosis/calcification is present, without any evidence of aortic stenosis.    Lab results:    Latest Ref Rng & Units 02/06/2024    9:39 AM 01/21/2024    3:21 PM 12/06/2023    6:36 PM  CMP  Glucose 70 - 99 mg/dL 95  92  97   BUN 8 - 27 mg/dL 37  28  22   Creatinine 0.57 - 1.00 mg/dL 8.16  7.80  8.40   Sodium 134 - 144 mmol/L 140  140  140   Potassium 3.5 - 5.2 mmol/L 5.2  4.6  4.9   Chloride 96 - 106 mmol/L 103  100  105   CO2 20 - 29 mmol/L 23  24  27    Calcium  8.7 - 10.3 mg/dL 9.5  9.5  9.3   Total Protein 6.5 - 8.1 g/dL   6.3   Total  Bilirubin 0.0 - 1.2 mg/dL   2.5   Alkaline Phos 38 - 126 U/L   100   AST 15 - 41 U/L   18   ALT 0 - 44 U/L   6    Assessment & Plan:   Problem List Items Addressed This Visit       Cardiovascular and Mediastinum   Essential hypertension - Primary   Blood pressure is in good control. Continue lisinopril 10 mg daily and metoprolol  tartrate 100 mg bid.      Heart failure with preserved ejection fraction (HCC)   Compensated. Continue metoprolol  tartrate 100 mg bid and lisinopril 10 mg daily. Continue furosemide  20 mg as needed for edema. I recommend she take a potassium supplement any days she takes furosemide .      Relevant Medications   empagliflozin  (JARDIANCE ) 10 MG TABS tablet     Respiratory   Allergic rhinitis   Recommend continuing Flonase  nasal spray.        Endocrine   Type 2 diabetes mellitus with diabetic chronic kidney disease (HCC)   A1c has been at goal.  Her chronic diarrhea may be due to her metformin  therapy. I recommend we stop the metformin . In light of her recent HF diagnosis and history of CKD, I will start her on empagliflozin  10 mg daily.      Relevant Medications   empagliflozin  (JARDIANCE ) 10 MG TABS tablet   Other Relevant Orders   Glucose, random   Hemoglobin A1c     Nervous and Auditory   Neuropathy   The hand symptoms sound neuropathic. I will check a Vitamin B12 level, esp. as she has been on metformin , which can cause B12 deficiency.      Relevant Orders   Vitamin B12     Genitourinary   Chronic kidney disease, stage 4 (severe) (HCC)   Cathy Richardson is followed by nephrology. Continue focus on blood pressure and glucose control, adequate hydration, and avoidance of nephrotoxic medications. Continue ACE-I and start an SGLT2i.        Other   Mixed hyperlipidemia   LDL cholesterol is at goal. Continue atorvastatin  20 mg daily.      Other Visit Diagnoses       Diarrhea, unspecified type       Possibel side effect of metformin . Which  change diabetes therapy. Discussed use of Imodium for episodes. Follow-up if not improving.       Return in about 3 months (around 05/22/2024) for Reassessment.   Garnette CHRISTELLA Simpler, MD

## 2024-02-20 NOTE — Assessment & Plan Note (Signed)
 Blood pressure is in good control. Continue lisinopril 10 mg daily and metoprolol  tartrate 100 mg bid.

## 2024-02-20 NOTE — Assessment & Plan Note (Signed)
 Compensated. Continue metoprolol  tartrate 100 mg bid and lisinopril 10 mg daily. Continue furosemide  20 mg as needed for edema. I recommend she take a potassium supplement any days she takes furosemide .

## 2024-02-20 NOTE — Assessment & Plan Note (Signed)
 Recommend continuing Flonase  nasal spray.

## 2024-02-20 NOTE — Assessment & Plan Note (Signed)
 The hand symptoms sound neuropathic. I will check a Vitamin B12 level, esp. as she has been on metformin , which can cause B12 deficiency.

## 2024-02-20 NOTE — Assessment & Plan Note (Signed)
 LDL cholesterol is at goal. Continue atorvastatin  20 mg daily.

## 2024-02-20 NOTE — Addendum Note (Signed)
 Addended by: THEDORA GARNETTE HERO on: 02/20/2024 02:41 PM   Modules accepted: Orders

## 2024-02-20 NOTE — Assessment & Plan Note (Signed)
 A1c has been at goal.  Her chronic diarrhea may be due to her metformin  therapy. I recommend we stop the metformin . In light of her recent HF diagnosis and history of CKD, I will start her on empagliflozin  10 mg daily.

## 2024-04-06 ENCOUNTER — Other Ambulatory Visit: Payer: Self-pay | Admitting: Family Medicine

## 2024-04-06 NOTE — Telephone Encounter (Unsigned)
 Copied from CRM [id number]. Topic: Clinical - Medication Refill >> Apr 06, 2024  2:18 PM Roselie C wrote: Medication: pregabalin  (LYRICA ) 25 MG capsule  Has the patient contacted their pharmacy? Yes (Agent: If no, request that the patient contact the pharmacy for the refill. If patient does not wish to contact the pharmacy document the reason why and proceed with request.) (Agent: If yes, when and what did the pharmacy advise?)  This is the patient's preferred pharmacy:  [facility] [address], [place] - [address] [place] [address] Phone: [phone] Fax: [phone]  Is this the correct pharmacy for this prescription? Yes If no, delete pharmacy and type the correct one.   Has the prescription been filled recently? No  Is the patient out of the medication? Yes  Has the patient been seen for an appointment in the last year OR does the patient have an upcoming appointment? Yes  Can we respond through MyChart? No, prefer text   Agent: Please be advised that Rx refills may take up to 3 business days. We ask that you follow-up with your pharmacy.

## 2024-04-07 MED ORDER — PREGABALIN 25 MG PO CAPS: 25.0000 mg | ORAL_CAPSULE | Freq: Two times a day (BID) | ORAL | 3 refills | Status: AC

## 2024-04-07 NOTE — Telephone Encounter (Signed)
 Refill request for  Pregablin 25 mg LR HX  LOV  02/20/24 FOV  05/25/24  Please review and advise.  Thanks. Dm/cma

## 2024-04-28 ENCOUNTER — Telehealth: Payer: Self-pay

## 2024-04-28 NOTE — Telephone Encounter (Signed)
 Called patient and spoke to her and she stated that she forgot to call us  back after her bone density scan.  That she would like to do whatever the doctor suggests her to do.  I will submit for prolia  the patient stated that if her insurance does not cover the meds that she more than likely will not take the prolia  I will call  her back as soon as I am aware of the cost

## 2024-05-05 ENCOUNTER — Other Ambulatory Visit: Payer: Self-pay | Admitting: Family Medicine

## 2024-05-05 DIAGNOSIS — I5032 Chronic diastolic (congestive) heart failure: Secondary | ICD-10-CM

## 2024-05-05 DIAGNOSIS — E1122 Type 2 diabetes mellitus with diabetic chronic kidney disease: Secondary | ICD-10-CM

## 2024-05-07 ENCOUNTER — Other Ambulatory Visit: Payer: Self-pay | Admitting: Radiology

## 2024-05-07 DIAGNOSIS — M810 Age-related osteoporosis without current pathological fracture: Secondary | ICD-10-CM

## 2024-05-07 MED ORDER — DENOSUMAB 60 MG/ML ~~LOC~~ SOSY
60.0000 mg | PREFILLED_SYRINGE | Freq: Once | SUBCUTANEOUS | Status: AC
  Administered 2024-05-14: 60 mg via SUBCUTANEOUS

## 2024-05-08 ENCOUNTER — Encounter: Payer: Self-pay | Admitting: Nurse Practitioner

## 2024-05-08 ENCOUNTER — Ambulatory Visit: Payer: Self-pay

## 2024-05-08 ENCOUNTER — Ambulatory Visit (INDEPENDENT_AMBULATORY_CARE_PROVIDER_SITE_OTHER): Admitting: Nurse Practitioner

## 2024-05-08 VITALS — BP 112/60 | HR 61 | Temp 98.3°F | Ht 62.0 in | Wt 207.0 lb

## 2024-05-08 DIAGNOSIS — Z23 Encounter for immunization: Secondary | ICD-10-CM | POA: Diagnosis not present

## 2024-05-08 DIAGNOSIS — R519 Headache, unspecified: Secondary | ICD-10-CM | POA: Insufficient documentation

## 2024-05-08 NOTE — Telephone Encounter (Signed)
 FYI Only or Action Required?: FYI only for provider.  Patient was last seen in primary care on 02/20/2024 by Cathy Garnette HERO, MD.  Called Nurse Triage reporting Headache.  Symptoms began several days ago.  Interventions attempted: Prescription medications: Tramadol.  Symptoms are: unchanged.  Triage Disposition: See HCP Within 4 Hours (Or PCP Triage)  Patient/caregiver understands and will follow disposition?: Yes  Copied from CRM [id number]. Topic: Clinical - Red Word Triage >> May 08, 2024 11:43 AM [place] A wrote: [facility] that prompted transfer to Nurse Triage: Patient's daughter called pt has had severe headaches for two days. Reason for Disposition  [1] SEVERE headache (e.g., excruciating) AND [2] not improved after 2 hours of pain medicine  Answer Assessment - Initial Assessment Questions No available appt with pcp, scheduled with alternative provider, 05/08/24.  Advised 911/ED if symptoms worsen.  1. LOCATION: Where does it hurt?      Right front and down neck, to right shoulder area 2. ONSET: When did the headache start? (e.g., minutes, hours, days)      2 days ago 3. PATTERN: Does the pain come and go, or has it been constant since it started?     Constant; took tramadol, but still there 4. SEVERITY: How bad is the pain? and What does it keep you from doing?  (e.g., Scale 1-10; mild, moderate, or severe)     8/10 5. RECURRENT SYMPTOM: Have you ever had headaches before? If Yes, ask: When was the last time? and What happened that time?      no 6. CAUSE: What do you think is causing the headache?     unsure 7. MIGRAINE: Have you been diagnosed with migraine headaches? If Yes, ask: Is this headache similar?      Yes, but it wasn't like this 8. HEAD INJURY: Has there been any recent injury to your head?      no 9. OTHER SYMPTOMS: Do you have any other symptoms? (e.g., fever, stiff neck, eye pain, sore throat, cold symptoms)     Denies blurred  vision, faint/ dizziness, numbness/weakness,  Able to walk/talk without difficulties, able to eat/drink, no b/b problems  Protocols used: Headache-A-AH

## 2024-05-08 NOTE — Patient Instructions (Signed)
 It was great to see you!  Start taking the flexeril  you have at home as needed   Start drinking 8 more ounces of water a day   Let's follow-up with any concerns   Take care,  Tinnie Harada, NP

## 2024-05-08 NOTE — Progress Notes (Signed)
 Acute Office Visit  Subjective:     Patient ID: Cathy Richardson, female    DOB: Jun 25, 1941, 83 y.o.   MRN: [id number]  Chief Complaint  Patient presents with   Headache    Headaches on and off for 3 days.   HPI: Discussed the use of AI scribe software for clinical note transcription with the patient, who gave verbal consent to proceed.  History of Present Illness   Cathy Richardson is an 83 year old female who presents with headaches and neck pain.  Headaches have been present for several days, primarily on the right side of the head, extending down the back of the neck. The sensation resembles a 'crick in the neck' with difficulty turning her head. Headaches are intermittent and improve with tramadol and rest but recur. There is no blurry vision, photophobia, or nausea associated with the headaches. No recent head trauma or falls.  She experiences frequent nausea, not linked to the headaches. No recent upper respiratory symptoms or exposure to illness. She takes tramadol for pain and has used cyclobenzaprine  in the past, though not recently. She also uses a Norco pain patch.  Hydration includes approximately 16 ounces of water daily, with one Pepsi and one cup of Tang. She is under stress due to moving between her daughters' homes and concerns about her daughter's financial situation. Her daughter observes increased daytime sleep.     ROS See pertinent positives and negatives per HPI.     Objective:    BP 112/60 (BP Location: Left Arm)   Pulse 61   Temp 98.3 F (36.8 C) (Oral)   Ht 5' 2 (1.575 m)   Wt 207 lb (93.9 kg)   SpO2 97%   BMI 37.86 kg/m  BP Readings from Last 3 Encounters:  05/08/24 112/60  02/20/24 126/64  01/21/24 (!) 108/54   Wt Readings from Last 3 Encounters:  05/08/24 207 lb (93.9 kg)  02/20/24 207 lb (93.9 kg)  01/21/24 207 lb (93.9 kg)      Physical Exam Vitals and nursing note reviewed.  Constitutional:      General: She is not in acute  distress.    Appearance: Normal appearance.  HENT:     Head: Normocephalic.  Eyes:     Conjunctiva/sclera: Conjunctivae normal.     Pupils: Pupils are equal, round, and reactive to light.  Cardiovascular:     Rate and Rhythm: Normal rate and regular rhythm.     Pulses: Normal pulses.     Heart sounds: Normal heart sounds.  Pulmonary:     Effort: Pulmonary effort is normal.     Breath sounds: Normal breath sounds.  Musculoskeletal:        General: Tenderness (right medial neck) present. No swelling.     Cervical back: Normal range of motion.  Skin:    General: Skin is warm.  Neurological:     General: No focal deficit present.     Mental Status: She is alert and oriented to person, place, and time.     Cranial Nerves: No cranial nerve deficit.     Motor: Weakness (generalized) present.     Coordination: Coordination normal.     Gait: Gait normal.  Psychiatric:        Mood and Affect: Mood normal.        Behavior: Behavior normal.        Thought Content: Thought content normal.        Judgment: Judgment normal.  Assessment & Plan:   Problem List Items Addressed This Visit       Other   Acute nonintractable headache - Primary   She experiences intermittent right-sided headache and neck pain, likely due to stress or dehydration. There are no visual changes, nausea, or trauma. Increase water intake by 8 ounces to prevent dehydration while trying to limit swelling and water retention with heart failure. Take Flexeril  at bedtime for neck pain and stress-related headaches. Continue tramadol as needed for pain.       Other Visit Diagnoses       Immunization due       Flu vaccine given today   Relevant Orders   Flu vaccine HIGH DOSE PF(Fluzone Trivalent) (Completed)       No orders of the defined types were placed in this encounter.   Return if symptoms worsen or fail to improve.  Tinnie DELENA Harada, NP

## 2024-05-08 NOTE — Telephone Encounter (Signed)
 Noted.  Appt today. Dm/cma

## 2024-05-08 NOTE — Assessment & Plan Note (Signed)
 She experiences intermittent right-sided headache and neck pain, likely due to stress or dehydration. There are no visual changes, nausea, or trauma. Increase water intake by 8 ounces to prevent dehydration while trying to limit swelling and water retention with heart failure. Take Flexeril  at bedtime for neck pain and stress-related headaches. Continue tramadol as needed for pain.

## 2024-05-14 ENCOUNTER — Ambulatory Visit: Admitting: Physician Assistant

## 2024-05-14 DIAGNOSIS — M810 Age-related osteoporosis without current pathological fracture: Secondary | ICD-10-CM | POA: Diagnosis not present

## 2024-05-14 NOTE — Progress Notes (Signed)
 Office Visit Note   Patient: Cathy Richardson           Date of Birth: [birth year]           MRN: [id number] Visit Date: 05/14/2024              Requested by: Thedora Garnette HERO, MD [address] [place],  [place] [address] PCP: Thedora Garnette HERO, MD  No chief complaint on file.     HPI: Patient comes in today for her first Prolia injection.  Assessment & Plan: Visit Diagnoses:  1. Age-related osteoporosis without current pathological fracture     Plan: I reviewed with her and her family possible side effects if she has any concerns about unusual paresthesias in her hands and toes that are different than her baseline neuropathy if she has any muscle cramping or pain she should contact us .  Subcu injection was done in her arm today.  I would like for her to follow-up in 1 month for a new be met to check her kidney and calcium  as she does have a history of kidney issues she would then also have a follow-up in 6 months and 1 day for her next injection [id number]  Follow-Up Instructions: Return in about 1 month (around 06/14/2024).   Ortho Exam  Patient is alert, oriented, no adenopathy, well-dressed, normal affect, normal respiratory effort.     Imaging: No results found. No images are attached to the encounter.  Labs: Lab Results  Component Value Date   HGBA1C 6.4 02/20/2024   HGBA1C 6.2 11/21/2023   REPTSTATUS 06/04/2021 FINAL 05/29/2021   REPTSTATUS 06/04/2021 FINAL 05/29/2021   CULT  05/29/2021    NO GROWTH 5 DAYS Performed at [facility], [address] [place], [place] [address]    CULT  05/29/2021    NO GROWTH 5 DAYS Performed at [facility], [address], [place], [place] [address]      Lab Results  Component Value Date   ALBUMIN 3.3 (L) 12/06/2023   ALBUMIN 3.2 (L) 04/11/2023   ALBUMIN 3.3 (L) 04/03/2022    Lab Results  Component Value Date   MG 2.8 (H) 01/21/2024   MG 2.3 04/11/2023   MG 2.1 06/26/2021    No results found for: VD25OH  No results found for: PREALBUMIN    Latest Ref Rng & Units 12/06/2023    6:36 PM 04/11/2023    7:02 PM 04/03/2022    1:24 PM  CBC EXTENDED  WBC 4.0 - 10.5 K/uL 2.8  3.7  3.8   RBC 3.87 - 5.11 MIL/uL 3.64  3.52  3.98   Hemoglobin 12.0 - 15.0 g/dL 88.8  89.2  87.8   HCT 36.0 - 46.0 % 35.4  34.3  38.4   Platelets 150 - 400 K/uL 220  211  254   NEUT# 1.7 - 7.7 K/uL 1.4  1.7    Lymph# 0.7 - 4.0 K/uL 1.1  1.6       There is no height or weight on file to calculate BMI.  Orders:  No orders of the defined types were placed in this encounter.  No orders of the defined types were placed in this encounter.    Procedures: No procedures performed  Clinical Data: No additional findings.  ROS:  All other systems negative, except as noted in the HPI. Review of Systems  Objective: Vital Signs: There were no vitals taken for this visit.  Specialty Comments:  No specialty comments available.  PMFS History: Patient Active Problem List   Diagnosis Date Noted   Acute nonintractable headache 05/08/2024   Heart failure with preserved ejection fraction (HCC) 02/20/2024   Neuropathy 02/20/2024   Allergic rhinitis 02/20/2024   Chronic pain of right knee 01/17/2024   Hammertoe 11/21/2023   Hallux valgus 11/21/2023   Intertrigo 11/21/2023   Chronic kidney disease, stage 4 (severe) (HCC) 11/21/2023   Chronic nausea 06/03/2023   DISH (diffuse idiopathic skeletal hyperostosis) 01/08/2023   SSS (sick sinus syndrome) (HCC) 10/01/2022   Opioid use, unspecified with unspecified opioid-induced disorder (HCC) 05/02/2022   Chronic right shoulder pain 08/21/2021   OSA on CPAP 05/29/2021   Solitary kidney, acquired 07/18/2020   Chronic fatigue 11/26/2019   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/2019   Mixed hyperlipidemia 09/05/2018   RLS (restless legs syndrome) 09/05/2018   Class 2 severe obesity due to excess calories with serious comorbidity and body mass  index (BMI) of 38.0 to 38.9 in adult 09/05/2018   Type 2 diabetes mellitus with diabetic chronic kidney disease (HCC) 09/05/2018   Chronic bilateral low back pain without sciatica 05/26/2018   History of colon cancer, stage I 12/13/2015   Adenocarcinoma of transverse colon (HCC) 11/12/2015   GERD (gastroesophageal reflux disease) 11/12/2015   Vitamin D  deficiency 11/12/2015   Chronic pain syndrome 11/12/2015   Asthma 11/12/2015   Leg edema 01/05/2015   Essential hypertension 06/17/2013   Personal history of renal cancer 06/17/2013   OA (osteoarthritis) 06/17/2013   Mixed incontinence 06/17/2013   Lumbar spondylosis 06/17/2013   Age-related osteoporosis without current pathological fracture 06/17/2013   Past Medical History:  Diagnosis Date   Anemia    Arthritis    Asthma    Cancer (HCC)    History of Renal Cancer   Chronic back pain    Depression    Diabetes mellitus without complication (HCC)    Excessive somnolence disorder    Family history of adverse reaction to anesthesia    son has problems with nausea    GERD (gastroesophageal reflux disease)    Glaucoma    pt denies on preop visit of 01/03/2015    Headache    Heart murmur    High cholesterol    History of hiatal hernia    Hypertension    Osteoporosis    Peptic ulcer    Renal disorder    only one kidney    Sleep apnea    cpap- does not know settings    Urinary tract infection    hx     Family History  Problem Relation Age of Onset   Stroke Mother    Diabetes Mother    Hypertension Mother    Stroke Father    Hypertension Father    Diabetes Daughter    Cancer Daughter        Cervical   Cancer Son        Colon   Cancer Son        Colon    Past Surgical History:  Procedure Laterality Date   bowel obstruction surgery      x 2    Carpule Tunnel Release Bilateral    CHOLECYSTECTOMY     KNEE ARTHROCENTESIS     left knee   NEPHRECTOMY     PACEMAKER IMPLANT     Removal of Skin Cancer  N/A     Forehead   SKIN SPLIT GRAFT N/A 01/05/2015   Procedure: MANDIBULAR LINGUAL SPLIT THICKNESS  SKIN GRAFT VESTIVULOPLASTY;  Surgeon: Elsie CHRISTELLA Daring, DDS;  Location: [facility];  Service: Oral Surgery;  Laterality: N/A;   TOTAL KNEE ARTHROPLASTY Left    left knee replacement    Social History   Occupational History   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

## 2024-05-19 ENCOUNTER — Ambulatory Visit: Admitting: Family Medicine

## 2024-05-19 ENCOUNTER — Encounter: Payer: Self-pay | Admitting: Family Medicine

## 2024-05-19 ENCOUNTER — Ambulatory Visit: Payer: Self-pay | Admitting: Family Medicine

## 2024-05-19 VITALS — BP 136/82 | HR 81 | Temp 97.0°F | Ht 62.0 in | Wt 205.2 lb

## 2024-05-19 DIAGNOSIS — N184 Chronic kidney disease, stage 4 (severe): Secondary | ICD-10-CM | POA: Diagnosis not present

## 2024-05-19 DIAGNOSIS — I5032 Chronic diastolic (congestive) heart failure: Secondary | ICD-10-CM | POA: Diagnosis not present

## 2024-05-19 DIAGNOSIS — E66812 Obesity, class 2: Secondary | ICD-10-CM

## 2024-05-19 DIAGNOSIS — Z7984 Long term (current) use of oral hypoglycemic drugs: Secondary | ICD-10-CM

## 2024-05-19 DIAGNOSIS — E782 Mixed hyperlipidemia: Secondary | ICD-10-CM

## 2024-05-19 DIAGNOSIS — E1122 Type 2 diabetes mellitus with diabetic chronic kidney disease: Secondary | ICD-10-CM | POA: Diagnosis not present

## 2024-05-19 DIAGNOSIS — I10 Essential (primary) hypertension: Secondary | ICD-10-CM

## 2024-05-19 DIAGNOSIS — Z6838 Body mass index (BMI) 38.0-38.9, adult: Secondary | ICD-10-CM

## 2024-05-19 DIAGNOSIS — I495 Sick sinus syndrome: Secondary | ICD-10-CM

## 2024-05-19 LAB — HEMOGLOBIN A1C: Hgb A1c MFr Bld: 6.3 % (ref 4.6–6.5)

## 2024-05-19 LAB — GLUCOSE, RANDOM: Glucose, Bld: 87 mg/dL (ref 70–99)

## 2024-05-19 NOTE — Assessment & Plan Note (Signed)
 Stable with pacemaker in place.

## 2024-05-19 NOTE — Assessment & Plan Note (Signed)
 LDL cholesterol is at goal. Continue atorvastatin  20 mg daily.

## 2024-05-19 NOTE — Progress Notes (Signed)
 [facility] [address] [place] [address] Dept: [phone] Dept Fax: [phone]  Chronic Care Office Visit  Subjective:    Patient ID: Cathy Richardson, female    DOB: [birth year], 83 y.o..   MRN: [id number]  Chief Complaint  Patient presents with   Hypertension    3 month f/u HTN.  No concerns.    History of Present Illness:  Patient is in today for reassessment of chronic medical issues.  Ms. Dahlem has a history of hyperlipidemia. She is managed on atorvastatin  20 mg daily.   Ms. Cullinane has a history of hypertension. She is managed on lisinopril 10 mg daily.   Ms. Sanford has a history of Type 2 diabetes. She is managed on empagliflozin  10 mg daily (metformin  stopped due to diarrhea).   Ms. Sando has a history of a sick sinus syndrome. She has a pacemaker.   Ms. Badger has HFpEF. She is managed on metoprolol  tartrate 100 mg bid, lisinopril 10 mg daily, and empagliflozin  10 mg daily.  She has furosemide  20 mg which she takes intermittently when her weight goes up 4-5 lbs over a week.    Ms. Hakala admits to some stress. Her small companion dog is having difficulty moving his hind legs. She is worried this may indicate a stroke. She is taking him to a International aid/development worker later today. She also is likely moving in with her daughter, Roxanne. Her other daughter has been resistant tot he move and Ms. Wheeler feels torn about this.  Past Medical History: Patient Active Problem List   Diagnosis Date Noted   Acute nonintractable headache 05/08/2024   Heart failure with preserved ejection fraction (HCC) 02/20/2024   Neuropathy 02/20/2024   Allergic rhinitis 02/20/2024   Chronic pain of right knee 01/17/2024   Hammertoe 11/21/2023   Hallux valgus 11/21/2023   Intertrigo 11/21/2023   Chronic kidney disease, stage 4 (severe) (HCC) 11/21/2023   Chronic nausea 06/03/2023   DISH (diffuse idiopathic skeletal hyperostosis) 01/08/2023   SSS  (sick sinus syndrome) (HCC) 10/01/2022   Chronic right shoulder pain 08/21/2021   OSA on CPAP 05/29/2021   Solitary kidney, acquired 07/18/2020   Chronic fatigue 11/26/2019   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/2019   Mixed hyperlipidemia 09/05/2018   RLS (restless legs syndrome) 09/05/2018   Class 2 severe obesity due to excess calories with serious comorbidity and body mass index (BMI) of 38.0 to 38.9 in adult 09/05/2018   Type 2 diabetes mellitus with diabetic chronic kidney disease (HCC) 09/05/2018   Chronic bilateral low back pain without sciatica 05/26/2018   History of colon cancer, stage I 12/13/2015   Adenocarcinoma of transverse colon (HCC) 11/12/2015   GERD (gastroesophageal reflux disease) 11/12/2015   Vitamin D  deficiency 11/12/2015   Chronic pain syndrome 11/12/2015   Asthma 11/12/2015   Leg edema 01/05/2015   Essential hypertension 06/17/2013   Personal history of renal cancer 06/17/2013   OA (osteoarthritis) 06/17/2013   Mixed incontinence 06/17/2013   Lumbar spondylosis 06/17/2013   Age-related osteoporosis without current pathological fracture 06/17/2013   Past Surgical History:  Procedure Laterality Date   bowel obstruction surgery      x 2    Carpule Tunnel Release Bilateral    CHOLECYSTECTOMY     KNEE ARTHROCENTESIS     left knee   NEPHRECTOMY     PACEMAKER IMPLANT     Removal of Skin Cancer  N/A    Forehead   SKIN SPLIT GRAFT  N/A 01/05/2015   Procedure: MANDIBULAR LINGUAL SPLIT THICKNESS SKIN GRAFT VESTIVULOPLASTY;  Surgeon: Elsie CHRISTELLA Daring, DDS;  Location: [facility];  Service: Oral Surgery;  Laterality: N/A;   TOTAL KNEE ARTHROPLASTY Left    left knee replacement    Family History  Problem Relation Age of Onset   Stroke Mother    Diabetes Mother    Hypertension Mother    Stroke Father    Hypertension Father    Diabetes Daughter    Cancer Daughter        Cervical   Cancer Son        Colon   Cancer Son        Colon   Outpatient Medications  Prior to Visit  Medication Sig Dispense Refill   albuterol  (PROVENTIL ) (2.5 MG/3ML) 0.083% nebulizer solution Take 3 mLs (2.5 mg total) by nebulization every 6 (six) hours as needed for wheezing or shortness of breath. 75 mL 12   atorvastatin  (LIPITOR) 20 MG tablet Take 20 mg by mouth at bedtime.     budesonide -formoterol  (SYMBICORT ) 160-4.5 MCG/ACT inhaler Inhale 2 puffs into the lungs 2 (two) times daily. 1 each 3   calcium  carbonate (TUMS - DOSED IN MG ELEMENTAL CALCIUM ) 500 MG chewable tablet Chew 2 tablets by mouth every morning.     Cholecalciferol  50 MCG (2000 UT) TABS Take 2,000 Units by mouth daily.     cyclobenzaprine  (FLEXERIL ) 10 MG tablet Take 10 mg by mouth daily as needed for muscle spasms.     escitalopram  (LEXAPRO ) 20 MG tablet Take 20 mg by mouth daily.     fentaNYL  (DURAGESIC ) 25 MCG/HR Place 1 patch onto the skin every 3 (three) days.     fluticasone  (FLONASE ) 50 MCG/ACT nasal spray Place 2 sprays into both nostrils daily.      furosemide  (LASIX ) 20 MG tablet Take 1 tablet (20 mg total) by mouth daily as needed for fluid (weight gain greater than 2 lbs in 24 hrs or 5 lbs in 1 week.).     HYDROcodone-acetaminophen  (NORCO/VICODIN) 5-325 MG tablet Take 1 tablet by mouth every 12 (twelve) hours as needed.     JARDIANCE  10 MG TABS tablet TAKE 1 TABLET BY MOUTH ONCE DAILY BEFORE BREAKFAST 30 tablet 0   latanoprost (XALATAN) 0.005 % ophthalmic solution 1 drop 4 (four) times daily.     lisinopril (ZESTRIL) 10 MG tablet Take 10 tablets by mouth daily.     metoprolol  tartrate (LOPRESSOR ) 100 MG tablet Take 1 tablet (100 mg total) by mouth 2 (two) times daily. 180 tablet 3   Multiple Vitamin (MULTIVITAMIN) tablet Take 1 tablet by mouth daily.     Omega-3 Fatty Acids (FISH OIL) 1000 MG CAPS Take 1,000 mg by mouth daily.     pantoprazole  (PROTONIX ) 40 MG tablet Take 40 mg by mouth.     pramipexole  (MIRAPEX ) 0.125 MG tablet Take 0.125-0.25 mg by mouth See admin instructions. Taking one or  two tablets at bedtime for restless leg     pregabalin  (LYRICA ) 25 MG capsule Take 1 capsule (25 mg total) by mouth in the morning and at bedtime. 180 capsule 3   silver  sulfADIAZINE  (SILVADENE ) 1 % cream Apply 1 Application topically 2 (two) times daily as needed (rash). 50 g 5   traMADol (ULTRAM) 50 MG tablet Take 1 tablet by mouth every 8 (eight) hours as needed.     potassium chloride  (KLOR-CON ) 10 MEQ tablet Take 1 tablet (10 mEq total) by mouth daily. (Patient not taking:  Reported on 05/19/2024) 90 tablet 0   No facility-administered medications prior to visit.   Allergies  Allergen Reactions   Gabapentin Shortness Of Breath   Fluoxetine     Other reaction(s): Other (See Comments) Severe headache   Iodine-131 Itching and Swelling    And swollen eyelids    Naproxen Itching   Reglan  [Metoclopramide ] Other (See Comments)    dizzy   Tizanidine Other (See Comments)    Confusion    Ivp Dye [Iodinated Contrast Media] Rash   Objective:   Today's Vitals   05/19/24 1345  BP: 136/82  Pulse: 81  Temp: (!) 97 F (36.1 C)  TempSrc: Temporal  SpO2: 96%  Weight: 205 lb 3.2 oz (93.1 kg)  Height: 5' 2 (1.575 m)   Body mass index is 37.53 kg/m.   General: Well developed, well nourished. No acute distress. Extremities: No edema noted. Psych: Alert and oriented. Normal mood and affect.  Health Maintenance Due  Topic Date Due   Zoster Vaccines- Shingrix (1 of 2) Never done   DTaP/Tdap/Td (2 - Td or Tdap) 02/04/2021   Medicare Annual Wellness (AWV)  06/07/2021     Assessment & Plan:   Problem List Items Addressed This Visit       Cardiovascular and Mediastinum   Essential hypertension   Blood pressure is in adequate control. Continue lisinopril 10 mg daily and metoprolol  tartrate 100 mg bid.      Heart failure with preserved ejection fraction (HCC) - Primary   Compensated. Continue metoprolol  tartrate 100 mg bid, lisinopril 10 mg daily, and empagliflozin  10 mg daily.  Continue furosemide  20 mg as needed for edema. I recommend she take a potassium supplement any days she takes furosemide .      SSS (sick sinus syndrome) (HCC)   Stable with pacemaker in place.        Endocrine   Type 2 diabetes mellitus with diabetic chronic kidney disease (HCC)   A1c has been at goal.  Continue empagliflozin  10 mg daily, started at her last visit.      Relevant Orders   Glucose, random (Completed)   Hemoglobin A1c (Completed)     Genitourinary   Chronic kidney disease, stage 4 (severe) (HCC)   Ms. Landin is followed by nephrology. Continue focus on blood pressure and glucose control, adequate hydration, and avoidance of nephrotoxic medications. Continue ACE-I and start an SGLT2i.        Other   Class 2 severe obesity due to excess calories with serious comorbidity and body mass index (BMI) of 38.0 to 38.9 in adult   Weight is down ~ 20 lbs. form maximum weight. She should continue lifestyle efforts to gradually reduce this further.      Mixed hyperlipidemia   LDL cholesterol is at goal. Continue atorvastatin  20 mg daily.       Return in about 3 months (around 08/19/2024) for Reassessment.   Garnette CHRISTELLA Simpler, MD

## 2024-05-19 NOTE — Assessment & Plan Note (Signed)
 Compensated. Continue metoprolol  tartrate 100 mg bid, lisinopril 10 mg daily, and empagliflozin  10 mg daily. Continue furosemide  20 mg as needed for edema. I recommend she take a potassium supplement any days she takes furosemide .

## 2024-05-19 NOTE — Assessment & Plan Note (Signed)
 Blood pressure is in adequate control. Continue lisinopril 10 mg daily and metoprolol  tartrate 100 mg bid.

## 2024-05-19 NOTE — Assessment & Plan Note (Signed)
 A1c has been at goal.  Continue empagliflozin  10 mg daily, started at her last visit.

## 2024-05-19 NOTE — Assessment & Plan Note (Signed)
 Weight is down ~ 20 lbs. form maximum weight. She should continue lifestyle efforts to gradually reduce this further.

## 2024-05-19 NOTE — Assessment & Plan Note (Signed)
 Cathy Richardson is followed by nephrology. Continue focus on blood pressure and glucose control, adequate hydration, and avoidance of nephrotoxic medications. Continue ACE-I and start an SGLT2i.

## 2024-05-22 ENCOUNTER — Other Ambulatory Visit: Payer: Self-pay | Admitting: Family Medicine

## 2024-05-22 DIAGNOSIS — L304 Erythema intertrigo: Secondary | ICD-10-CM

## 2024-05-29 ENCOUNTER — Ambulatory Visit

## 2024-06-01 ENCOUNTER — Encounter: Payer: Self-pay | Admitting: Radiology

## 2024-06-05 ENCOUNTER — Other Ambulatory Visit: Payer: Self-pay | Admitting: Family Medicine

## 2024-06-05 DIAGNOSIS — I5032 Chronic diastolic (congestive) heart failure: Secondary | ICD-10-CM

## 2024-06-05 DIAGNOSIS — E1122 Type 2 diabetes mellitus with diabetic chronic kidney disease: Secondary | ICD-10-CM

## 2024-06-16 ENCOUNTER — Ambulatory Visit (INDEPENDENT_AMBULATORY_CARE_PROVIDER_SITE_OTHER): Admitting: Physician Assistant

## 2024-06-16 DIAGNOSIS — M810 Age-related osteoporosis without current pathological fracture: Secondary | ICD-10-CM

## 2024-06-16 DIAGNOSIS — M1711 Unilateral primary osteoarthritis, right knee: Secondary | ICD-10-CM

## 2024-06-17 ENCOUNTER — Ambulatory Visit: Payer: Self-pay | Admitting: Family Medicine

## 2024-06-17 DIAGNOSIS — E875 Hyperkalemia: Secondary | ICD-10-CM

## 2024-06-17 LAB — BASIC METABOLIC PANEL WITH GFR
BUN/Creatinine Ratio: 11 (calc) (ref 6–22)
BUN: 19 mg/dL (ref 7–25)
CO2: 25 mmol/L (ref 20–32)
Calcium: 8.7 mg/dL (ref 8.6–10.4)
Chloride: 106 mmol/L (ref 98–110)
Creat: 1.75 mg/dL — ABNORMAL HIGH (ref 0.60–0.95)
Glucose, Bld: 92 mg/dL (ref 65–99)
Potassium: 5.8 mmol/L — ABNORMAL HIGH (ref 3.5–5.3)
Sodium: 139 mmol/L (ref 135–146)
eGFR: 29 mL/min/1.73m2 — ABNORMAL LOW (ref >=60)

## 2024-06-17 LAB — EXTRA LAV TOP TUBE

## 2024-06-17 NOTE — Progress Notes (Deleted)
 Office Visit Note   Patient: Cathy Richardson           Date of Birth: 1941/06/16           MRN: [id number] Visit Date: 06/16/2024              Requested by: Thedora Garnette HERO, MD [address] [place],  [place] [address] PCP: Thedora Garnette HERO, MD   Assessment & Plan: Visit Diagnoses:  1. Age-related osteoporosis without current pathological fracture   2. Primary osteoarthritis of right knee     Plan: ***  Follow-Up Instructions: No follow-ups on file.   Orders:  Orders Placed This Encounter  Procedures   Basic Metabolic Panel (BMET)   EXTRA LAV TOP TUBE   No orders of the defined types were placed in this encounter.     Procedures: No procedures performed   Clinical Data: No additional findings.   Subjective: No chief complaint on file.   HPI  Review of Systems   Objective: Vital Signs: There were no vitals taken for this visit.  Physical Exam  Ortho Exam  Specialty Comments:  No specialty comments available.  Imaging: No results found.   PMFS History: Patient Active Problem List   Diagnosis Date Noted   Acute nonintractable headache 05/08/2024   Heart failure with preserved ejection fraction (HCC) 02/20/2024   Neuropathy 02/20/2024   Allergic rhinitis 02/20/2024   Chronic pain of right knee 01/17/2024   Hammertoe 11/21/2023   Hallux valgus 11/21/2023   Intertrigo 11/21/2023   Chronic kidney disease, stage 4 (severe) (HCC) 11/21/2023   Chronic nausea 06/03/2023   DISH (diffuse idiopathic skeletal hyperostosis) 01/08/2023   SSS (sick sinus syndrome) (HCC) 10/01/2022   Chronic right shoulder pain 08/21/2021   OSA on CPAP 05/29/2021   Solitary kidney, acquired 07/18/2020   Chronic fatigue 11/26/2019   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/2019   Mixed hyperlipidemia 09/05/2018   RLS (restless legs syndrome) 09/05/2018   Class 2 severe obesity due to excess calories with serious comorbidity and body mass index (BMI) of 38.0 to 38.9  in adult 09/05/2018   Type 2 diabetes mellitus with diabetic chronic kidney disease (HCC) 09/05/2018   Chronic bilateral low back pain without sciatica 05/26/2018   History of colon cancer, stage I 12/13/2015   GERD (gastroesophageal reflux disease) 11/12/2015   Vitamin D  deficiency 11/12/2015   Chronic pain syndrome 11/12/2015   Asthma 11/12/2015   Leg edema 01/05/2015   Essential hypertension 06/17/2013   Personal history of renal cancer 06/17/2013   OA (osteoarthritis) 06/17/2013   Mixed incontinence 06/17/2013   Lumbar spondylosis 06/17/2013   Age-related osteoporosis without current pathological fracture 06/17/2013   Past Medical History:  Diagnosis Date   Anemia    Arthritis    Asthma    Cancer (HCC)    History of Renal Cancer   Chronic back pain    Depression    Diabetes mellitus without complication (HCC)    Excessive somnolence disorder    Family history of adverse reaction to anesthesia    son has problems with nausea    GERD (gastroesophageal reflux disease)    Glaucoma    pt denies on preop visit of 01/03/2015    Headache    Heart murmur    High cholesterol    History of hiatal hernia    Hypertension    Osteoporosis    Peptic ulcer    Renal disorder    only one kidney  Sleep apnea    cpap- does not know settings    Urinary tract infection    hx     Family History  Problem Relation Age of Onset   Stroke Mother    Diabetes Mother    Hypertension Mother    Stroke Father    Hypertension Father    Diabetes Daughter    Cancer Daughter        Cervical   Cancer Son        Colon   Cancer Son        Colon    Past Surgical History:  Procedure Laterality Date   bowel obstruction surgery      x 2    Carpule Tunnel Release Bilateral    CHOLECYSTECTOMY     KNEE ARTHROCENTESIS     left knee   NEPHRECTOMY     PACEMAKER IMPLANT     Removal of Skin Cancer  N/A    Forehead   SKIN SPLIT GRAFT N/A 01/05/2015   Procedure: MANDIBULAR LINGUAL SPLIT  THICKNESS SKIN GRAFT VESTIVULOPLASTY;  Surgeon: Elsie CHRISTELLA Daring, DDS;  Location: [facility];  Service: Oral Surgery;  Laterality: N/A;   TOTAL KNEE ARTHROPLASTY Left    left knee replacement    Social History   Occupational History   Occupation: Retired- [employer]  Tobacco Use   Smoking status: Never   Smokeless tobacco: Never  Vaping Use   Vaping status: Never Used  Substance and Sexual Activity   Alcohol  use: No   Drug use: No   Sexual activity: Not Currently

## 2024-06-18 ENCOUNTER — Other Ambulatory Visit (INDEPENDENT_AMBULATORY_CARE_PROVIDER_SITE_OTHER)

## 2024-06-18 DIAGNOSIS — E875 Hyperkalemia: Secondary | ICD-10-CM | POA: Diagnosis not present

## 2024-06-18 LAB — POTASSIUM: Potassium: 4.7 meq/L (ref 3.5–5.1)

## 2024-06-19 ENCOUNTER — Ambulatory Visit: Payer: Self-pay | Admitting: Family Medicine

## 2024-06-30 ENCOUNTER — Encounter: Admitting: Family Medicine

## 2024-06-30 ENCOUNTER — Telehealth: Payer: Self-pay

## 2024-06-30 NOTE — Telephone Encounter (Signed)
 Noted will be on the look out for the paper work to be faxed. Dm/cma

## 2024-06-30 NOTE — Telephone Encounter (Signed)
 Copied from CRM #[id number]. Topic: General - Other >> Jun 30, 2024 10:04 AM China J wrote: Reason for CRM: Patient received a call from [facility]. They manage her diabetic supply and told her that they would be reaching out to us  to obtain an order from Dr. Thedora to continue providing her supplies.   Ultimately, she was just wanting to be sure we had their information so we can be aware of who's calling and what they do.  Phone Number: [phone]

## 2024-07-02 ENCOUNTER — Other Ambulatory Visit: Payer: Self-pay | Admitting: Family Medicine

## 2024-07-02 DIAGNOSIS — I5032 Chronic diastolic (congestive) heart failure: Secondary | ICD-10-CM

## 2024-07-02 DIAGNOSIS — E1122 Type 2 diabetes mellitus with diabetic chronic kidney disease: Secondary | ICD-10-CM

## 2024-07-02 NOTE — Progress Notes (Signed)
 letter

## 2024-07-06 ENCOUNTER — Telehealth: Payer: Self-pay

## 2024-07-06 NOTE — Telephone Encounter (Signed)
 Called and was advised that the information was sent to [facility].  Advised that I didn't get that and they will fax it directly to our fax number @ [phone].  Dm/cma

## 2024-07-06 NOTE — Progress Notes (Signed)
 noted

## 2024-07-06 NOTE — Telephone Encounter (Signed)
 Copied from CRM [id number]. Topic: Clinical - Order For Equipment >> Jul 06, 2024  1:59 PM Dedra B wrote: Reason for CRM: Jordan from [facility] called to follow up on order form that was faxed over. Jordan can be reached at [phone] if any questions.

## 2024-07-07 ENCOUNTER — Ambulatory Visit: Payer: Self-pay

## 2024-07-07 NOTE — Telephone Encounter (Signed)
 FYI Only or Action Required?: FYI only for provider: appointment scheduled on 07/09/2024 at 8:40 AM.  Patient was last seen in primary care on 05/19/2024 by Thedora Garnette HERO, MD.  Called Nurse Triage reporting Breast Problem.  Symptoms began Sunday.  Interventions attempted: Rest, hydration, or home remedies.  Symptoms are: unchanged.  Triage Disposition: See PCP When Office is Open (Within 3 Days)  Patient/caregiver understands and will follow disposition?: Yes  Copied from CRM #[id number]. Topic: Clinical - Red Word Triage >> Jul 07, 2024 12:17 PM Mesmerise C wrote: [facility] that prompted transfer to Nurse Triage: Patient has a raised lump under her left breast states its red and hard states she noticed it Sunday when she was getting ready states there's pain to it as well Reason for Disposition  Breast lump  Answer Assessment - Initial Assessment Questions Patient reports lump to the underside of her left breast. Patient reports the lump has some pain and redness. Patient scheduled to see a provider on 07/09/2024 at 8:40 AM.   1. SYMPTOM: What's the main symptom you're concerned about?  (e.g., lump, nipple discharge, pain, rash)     lump 2. LOCATION: Where is the lump located?     Under left breast 3. ONSET: When did lump  start?     Patient found the lump on Sunday 4. PRIOR HISTORY: Do you have any history of prior problems with your breasts? (e.g., breast cancer, breast implant, fibrocystic breast disease)     no 5. CAUSE: What do you think is causing this symptom?     unsure 6. OTHER SYMPTOMS: Do you have any other symptoms? (e.g., breast pain, fever, nipple discharge, redness or rash)     Breast pain, redness  Protocols used: Breast Symptoms-A-AH

## 2024-07-08 NOTE — Telephone Encounter (Signed)
 Appointment scheduled 07/09/24 with Dr Prentiss.Dm/cma

## 2024-07-09 ENCOUNTER — Ambulatory Visit: Admitting: Family Medicine

## 2024-07-09 ENCOUNTER — Encounter: Payer: Self-pay | Admitting: Family Medicine

## 2024-07-09 VITALS — BP 118/68 | HR 83 | Ht 62.0 in | Wt 203.4 lb

## 2024-07-09 DIAGNOSIS — N611 Abscess of the breast and nipple: Secondary | ICD-10-CM

## 2024-07-09 DIAGNOSIS — E1122 Type 2 diabetes mellitus with diabetic chronic kidney disease: Secondary | ICD-10-CM

## 2024-07-09 MED ORDER — DOXYCYCLINE HYCLATE 100 MG PO TABS: 100.0000 mg | ORAL_TABLET | Freq: Two times a day (BID) | ORAL | 0 refills | Status: DC

## 2024-07-09 NOTE — Patient Instructions (Signed)
 Plan  1. Promote spontaneous drainage  Warm compresses 10-15 min, 3-4/day until drainage occurs.  Keep area covered with a clean, dry dressing.  Do not squeeze or lance at home.  2. Once it drains  Clean with warm water/soap; allow gentle expression only if freely draining.  Apply topical mupirocin BID to the open area for 5-7 days.  Continue daily dressing changes.  3. Antibiotics Doxycycline 100 mg twice daily x 5-7 days  4. Return precautions  Fever or chills  Expanding redness or worsening pain  No drainage within 24-48 hours  Spreading firmness, fluctuance, or concern for deeper infection

## 2024-07-09 NOTE — Progress Notes (Signed)
 Assessment & Plan:   1. Abscess of skin of breast, left (Primary) Small superficial breast abscess with very thin overlying skin, imminent spontaneous drainage. No systemic symptoms. DM, but well-controlled. No Hx of sores/abscesses. Safe for conservative at-home management. See AVS for instructions.  - doxycycline (VIBRA-TABS) 100 MG tablet; Take 1 tablet (100 mg total) by mouth 2 (two) times daily.  Dispense: 14 tablet; Refill: 0  2. Type 2 diabetes mellitus with stage 4 chronic kidney disease, without long-term current use of insulin  (HCC) DM well controlled. No need to adjust Doxycycline.   Geni Shutter, DO, MS, FAAFP, Dipl. [facility] [address] [place], [address] Dept: [phone] Dept Fax: [phone]  Subjective:   HPI: Sore on left inner breast, x a few days, uncomfortable.  Review of Systems: Negative, with the exception of above mentioned in HPI. Current Medications[1]   Objective:   BP 118/68 (BP Location: Left Arm, Cuff Size: Large)   Pulse 83   Ht 5' 2 (1.575 m)   Wt 203 lb 6.4 oz (92.3 kg)   SpO2 96%   BMI 37.20 kg/m   Wt Readings from Last 3 Encounters:  07/09/24 203 lb 6.4 oz (92.3 kg)  05/19/24 205 lb 3.2 oz (93.1 kg)  05/08/24 207 lb (93.9 kg)   Physical Exam Vitals reviewed.  Skin:    Comments: Left inner breast line: Small superficial breast abscess with very thin overlying skin, imminent spontaneous drainage, without surrounding erythema or streaking. No lymphadenopathy.  Neurological:     Mental Status: She is alert.    Lab Results  Component Value Date   CREATININE 1.75 (H) 06/16/2024   BUN 19 06/16/2024   NA 139 06/16/2024   K 4.7 06/18/2024   CL 106 06/16/2024   CO2 25 06/16/2024   Lab Results  Component Value Date   ALT 6 12/06/2023   AST 18 12/06/2023   ALKPHOS 100 12/06/2023   BILITOT 2.5 (H) 12/06/2023   Lab Results  Component Value Date   HGBA1C 6.3 05/19/2024    HGBA1C 6.4 02/20/2024   HGBA1C 6.2 11/21/2023   Lab Results  Component Value Date   TSH 1.731 04/11/2023   Lab Results  Component Value Date   CHOL 112 11/21/2023   HDL 38.60 (L) 11/21/2023   LDLCALC 20 11/21/2023   TRIG 266.0 (H) 11/21/2023   CHOLHDL 3 11/21/2023   Lab Results  Component Value Date   WBC 2.8 (L) 12/06/2023   HGB 11.1 (L) 12/06/2023   HCT 35.4 (L) 12/06/2023   MCV 97.3 12/06/2023   PLT 220 12/06/2023      [1]  Current Outpatient Medications:    doxycycline (VIBRA-TABS) 100 MG tablet, Take 1 tablet (100 mg total) by mouth 2 (two) times daily., Disp: 14 tablet, Rfl: 0   albuterol  (PROVENTIL ) (2.5 MG/3ML) 0.083% nebulizer solution, Take 3 mLs (2.5 mg total) by nebulization every 6 (six) hours as needed for wheezing or shortness of breath., Disp: 75 mL, Rfl: 12   atorvastatin  (LIPITOR) 20 MG tablet, Take 20 mg by mouth at bedtime., Disp: , Rfl:    budesonide -formoterol  (SYMBICORT ) 160-4.5 MCG/ACT inhaler, Inhale 2 puffs into the lungs 2 (two) times daily., Disp: 1 each, Rfl: 3   calcium  carbonate (TUMS - DOSED IN MG ELEMENTAL CALCIUM ) 500 MG chewable tablet, Chew 2 tablets by mouth every morning., Disp: , Rfl:    Cholecalciferol  50 MCG (2000 UT) TABS, Take 2,000 Units by mouth daily., Disp: ,  Rfl:    cyclobenzaprine  (FLEXERIL ) 10 MG tablet, Take 10 mg by mouth daily as needed for muscle spasms., Disp: , Rfl:    empagliflozin  (JARDIANCE ) 10 MG TABS tablet, TAKE 1 TABLET BY MOUTH ONCE DAILY BEFORE BREAKFAST, Disp: 90 tablet, Rfl: 3   escitalopram  (LEXAPRO ) 20 MG tablet, Take 20 mg by mouth daily., Disp: , Rfl:    fentaNYL  (DURAGESIC ) 25 MCG/HR, Place 1 patch onto the skin every 3 (three) days., Disp: , Rfl:    fluticasone  (FLONASE ) 50 MCG/ACT nasal spray, Place 2 sprays into both nostrils daily. , Disp: , Rfl:    furosemide  (LASIX ) 20 MG tablet, Take 1 tablet (20 mg total) by mouth daily as needed for fluid (weight gain greater than 2 lbs in 24 hrs or 5 lbs in 1  week.)., Disp: , Rfl:    HYDROcodone-acetaminophen  (NORCO/VICODIN) 5-325 MG tablet, Take 1 tablet by mouth every 12 (twelve) hours as needed., Disp: , Rfl:    latanoprost (XALATAN) 0.005 % ophthalmic solution, 1 drop 4 (four) times daily., Disp: , Rfl:    lisinopril (ZESTRIL) 10 MG tablet, Take 10 tablets by mouth daily., Disp: , Rfl:    metoprolol  tartrate (LOPRESSOR ) 100 MG tablet, Take 1 tablet (100 mg total) by mouth 2 (two) times daily., Disp: 180 tablet, Rfl: 3   Multiple Vitamin (MULTIVITAMIN) tablet, Take 1 tablet by mouth daily., Disp: , Rfl:    Omega-3 Fatty Acids (FISH OIL) 1000 MG CAPS, Take 1,000 mg by mouth daily., Disp: , Rfl:    pantoprazole  (PROTONIX ) 40 MG tablet, Take 40 mg by mouth., Disp: , Rfl:    potassium chloride  (KLOR-CON ) 10 MEQ tablet, Take 1 tablet (10 mEq total) by mouth daily. (Patient not taking: Reported on 05/19/2024), Disp: 90 tablet, Rfl: 0   pramipexole  (MIRAPEX ) 0.125 MG tablet, Take 0.125-0.25 mg by mouth See admin instructions. Taking one or two tablets at bedtime for restless leg, Disp: , Rfl:    pregabalin  (LYRICA ) 25 MG capsule, Take 1 capsule (25 mg total) by mouth in the morning and at bedtime., Disp: 180 capsule, Rfl: 3   silver  sulfADIAZINE  (SILVADENE ) 1 % cream, APPLY  CREAM EXTERNALLY TWICE DAILY AS NEEDED, Disp: 50 g, Rfl: 0   traMADol (ULTRAM) 50 MG tablet, Take 1 tablet by mouth every 8 (eight) hours as needed., Disp: , Rfl:

## 2024-07-20 ENCOUNTER — Ambulatory Visit: Attending: Cardiology | Admitting: Cardiology

## 2024-07-20 ENCOUNTER — Encounter: Payer: Self-pay | Admitting: Cardiology

## 2024-07-20 VITALS — BP 115/74 | HR 76 | Ht 64.0 in | Wt 199.6 lb

## 2024-07-20 DIAGNOSIS — I495 Sick sinus syndrome: Secondary | ICD-10-CM | POA: Insufficient documentation

## 2024-07-20 DIAGNOSIS — I5032 Chronic diastolic (congestive) heart failure: Secondary | ICD-10-CM | POA: Diagnosis not present

## 2024-07-20 MED ORDER — FUROSEMIDE 20 MG PO TABS: 20.0000 mg | ORAL_TABLET | Freq: Every day | ORAL | Status: AC

## 2024-07-20 MED ORDER — METOPROLOL TARTRATE 50 MG PO TABS: 50.0000 mg | ORAL_TABLET | Freq: Two times a day (BID) | ORAL | 3 refills | Status: AC

## 2024-07-20 NOTE — Progress Notes (Signed)
 " Cardiology Office Note:  .   Date:  07/20/2024  ID:  Cathy Richardson, DOB 1940-08-31, MRN [id number] PCP: Thedora Garnette HERO, MD  [facility] Providers Cardiologist:  Newman Lawrence, MD PCP: Thedora Garnette HERO, MD  Chief Complaint  Patient presents with   HFpEF     Cathy Richardson is a 83 y.o. female with hypertension, hyperlipidemia, CKD stage 3/4, sick sinus syndrome s/p Medtronic dual-chamber pacemaker placement,  HFpEF, paroxysmal atrial tachycardia, OSA   Discussed the use of AI scribe software for clinical note transcription with the patient, who gave verbal consent to proceed.  History of Present Illness Cathy Richardson is an 83 year old female with heart failure with preserved ejection fraction who presents with swelling in her feet and legs.  She notes recent increase in bilateral leg and foot swelling. Her legs were previously thin and are now noticeably edematous. She lives a mostly sedentary lifestyle and ambulates with a cane outside, using furniture for support at home.  She uses Lasix  only when her weight increases by 2 or more pounds above her dry weight of about 195-196 pounds. Her current weight is 199 pounds.  Her breathing is generally stable with intermittent dyspnea triggered by allergens such as paint fumes or weather changes. She does not have daily shortness of breath.  She limits added salt but eats out about 10 of 21 meals per week, which increases sodium intake.  She previously took potassium supplements that were discontinued for hyperkalemia. She avoids bananas. She takes metoprolol  100 mg twice daily long term.      Vitals:   07/20/24 0829  BP: 115/74  Pulse: 76  SpO2: 96%      Review of Systems  Cardiovascular:  Positive for leg swelling. Negative for chest pain, dyspnea on exertion, palpitations and syncope.        Studies Reviewed: .        Echocardiogram 12/2023: 1. Left ventricular ejection fraction, by estimation, is 60 to  65%. Left  ventricular ejection fraction by 3D volume is 58 %. The left ventricle has  normal function. The left ventricle has no regional wall motion  abnormalities. Left ventricular diastolic   parameters are consistent with Grade I diastolic dysfunction (impaired  relaxation). The average left ventricular global longitudinal strain is  -25.3 %. The global longitudinal strain is normal.   2. Right ventricular systolic function is normal. The right ventricular  size is normal.   3. The mitral valve is normal in structure. Trivial mitral valve  regurgitation. No evidence of mitral stenosis.   4. The aortic valve is tricuspid. There is mild calcification of the  aortic valve. Aortic valve regurgitation is not visualized. Aortic valve  sclerosis/calcification is present, without any evidence of aortic  stenosis.    Labs 05/2024: HbA1C 6.3% Hb 11.1, WBC 2.8 Cr 1.75, eGFR 29, K 5.8-->4.7  11/2023: Chol 112, TG 266, HDL 38, LDL 20 BNP 181  Risk Assessment/Calculations:    CHA2DS2-VASc Score = 5  This indicates a 7.2% annual risk of stroke. The patient's score is based upon: CHF History: 1 HTN History: 1 Diabetes History: 0 Stroke History: 0 Vascular Disease History: 0 Age Score: 2 Gender Score: 1     Physical Exam Vitals and nursing note reviewed.  Constitutional:      General: She is not in acute distress. Neck:     Vascular: No JVD.  Cardiovascular:     Rate and Rhythm: Normal rate and  regular rhythm.     Heart sounds: Normal heart sounds. No murmur heard. Pulmonary:     Effort: Pulmonary effort is normal.     Breath sounds: Normal breath sounds. No wheezing or rales.  Musculoskeletal:     Right lower leg: Edema (1+) present.     Left lower leg: Edema (Trace) present.      VISIT DIAGNOSES:   ICD-10-CM   1. Chronic heart failure with preserved ejection fraction (HFpEF) (HCC)  I50.32     2. SSS (sick sinus syndrome) (HCC)  I49.5        Cathy Richardson is  a 83 y.o. female with hypertension, hyperlipidemia, CKD stage 3/4, sick sinus syndrome s/p Medtronic dual-chamber pacemaker placement,  HFpEF, paroxysmal atrial tachycardia, OSA   Assessment & Plan Heart failure with preserved ejection fraction: Heart failure with preserved ejection fraction, characterized by normal heart pumping but abnormal relaxation, leading to fluid retention. Current management includes metoprolol , which is not ideal for this type of heart failure. Lasix  is used intermittently based on weight changes. Dietary sodium intake is high due to frequent eating out. - Start Lasix  20 mg daily to manage fluid retention. - Reduce dietary sodium intake by eating more home-cooked meals and avoiding canned foods, fries, pickles, and chips. - Cut metoprolol  dose to 50 mg twice a day. - Plan to wean off metoprolol  in the future.  History of hyperkalemia: Hyperkalemia, previously managed by stopping potassium supplements. Current potassium levels are high, so medications like spironolactone are avoided. - Avoid potassium supplements and medications that can retain potassium.  Sick sinus syndrome: S/p dual chamber pacemaker placement ([facility]).  No interrogation performed in our office.  Will need to enroll in device clinic.         Meds ordered this encounter  Medications   furosemide  (LASIX ) 20 MG tablet    Sig: Take 1 tablet (20 mg total) by mouth daily.   metoprolol  tartrate (LOPRESSOR ) 50 MG tablet    Sig: Take 1 tablet (50 mg total) by mouth 2 (two) times daily.    Dispense:  180 tablet    Refill:  3     F/u in 3 months  Signed, Newman JINNY Lawrence, MD  "

## 2024-07-20 NOTE — Addendum Note (Signed)
 Addended by: MANDA BOTTCHER B on: 07/20/2024 12:09 PM   Modules accepted: Orders

## 2024-07-20 NOTE — Patient Instructions (Signed)
 Medication Instructions:  CHANGE Lasix  to daily   DECREASE Metoprolol  to 50 mg twice daily   *If you need a refill on your cardiac medications before your next appointment, please call your pharmacy*  Follow-Up: At [facility], you and your health needs are our priority.  As part of our continuing mission to provide you with exceptional heart care, our providers are all part of one team.  This team includes your primary Cardiologist (physician) and Advanced Practice Providers or APPs (Physician Assistants and Nurse Practitioners) who all work together to provide you with the care you need, when you need it.  Your next appointment:   09/2024   Provider:   One of our Advanced Practice Providers (APPs) or Dr. Elmira: Morse Clause, PA-C  Lamarr Satterfield, NP Miriam Shams, NP  Olivia Pavy, PA-C Josefa Beauvais, NP  Leontine Salen, PA-C Orren Fabry, PA-C  [place], [place] Jackee Alberts, NP  Damien Braver, NP Jon Hails, PA-C  Waddell Donath, PA-C    Dayna Dunn, PA-C  Scott Weaver, PA-C Lum Louis, NP Katlyn West, NP Callie Goodrich, PA-C  Xika Zhao, NP Sheng Haley, PA-C    Kathleen Johnson, PA-C   Then, Newman JINNY Elmira, MD will plan to see you again in 6 month(s).

## 2024-08-18 ENCOUNTER — Ambulatory Visit
Attending: Student in an Organized Health Care Education/Training Program | Admitting: Student in an Organized Health Care Education/Training Program

## 2024-08-18 ENCOUNTER — Encounter: Payer: Self-pay | Admitting: Student in an Organized Health Care Education/Training Program

## 2024-08-18 VITALS — BP 112/58 | HR 70 | Ht 64.0 in | Wt 198.0 lb

## 2024-08-18 DIAGNOSIS — I495 Sick sinus syndrome: Secondary | ICD-10-CM | POA: Insufficient documentation

## 2024-08-18 DIAGNOSIS — Z950 Presence of cardiac pacemaker: Secondary | ICD-10-CM | POA: Insufficient documentation

## 2024-08-18 LAB — CUP PACEART INCLINIC DEVICE CHECK
Battery Remaining Longevity: 99 mo
Battery Voltage: 3.01 V
Brady Statistic AP VP Percent: 39.37 %
Brady Statistic AP VS Percent: 60.58 %
Brady Statistic AS VP Percent: 0.01 %
Brady Statistic AS VS Percent: 0.03 %
Brady Statistic RA Percent Paced: 99.96 %
Brady Statistic RV Percent Paced: 39.39 %
Date Time Interrogation Session: 20260120123241
Implantable Lead Connection Status: 753985
Implantable Lead Connection Status: 753985
Implantable Lead Implant Date: 20190710
Implantable Lead Implant Date: 20190710
Implantable Lead Location: 753859
Implantable Lead Location: 753860
Implantable Lead Model: 5076
Implantable Lead Model: 5076
Implantable Pulse Generator Implant Date: 20210816
Lead Channel Impedance Value: 323 Ohm
Lead Channel Impedance Value: 342 Ohm
Lead Channel Impedance Value: 456 Ohm
Lead Channel Impedance Value: 513 Ohm
Lead Channel Pacing Threshold Amplitude: 0.75 V
Lead Channel Pacing Threshold Amplitude: 1 V
Lead Channel Pacing Threshold Pulse Width: 0.4 ms
Lead Channel Pacing Threshold Pulse Width: 0.4 ms
Lead Channel Sensing Intrinsic Amplitude: 3.625 mV
Lead Channel Sensing Intrinsic Amplitude: 5.375 mV
Lead Channel Setting Pacing Amplitude: 2 V
Lead Channel Setting Pacing Amplitude: 2.5 V
Lead Channel Setting Pacing Pulse Width: 0.4 ms
Lead Channel Setting Sensing Sensitivity: 1.2 mV
Zone Setting Status: 755011
Zone Setting Status: 755011

## 2024-08-18 NOTE — Patient Instructions (Signed)

## 2024-08-20 ENCOUNTER — Ambulatory Visit (INDEPENDENT_AMBULATORY_CARE_PROVIDER_SITE_OTHER): Admitting: Family Medicine

## 2024-08-20 ENCOUNTER — Ambulatory Visit: Payer: Self-pay | Admitting: Family Medicine

## 2024-08-20 ENCOUNTER — Encounter: Payer: Self-pay | Admitting: Family Medicine

## 2024-08-20 VITALS — BP 114/60 | HR 63 | Temp 97.7°F | Ht 64.0 in | Wt 204.2 lb

## 2024-08-20 DIAGNOSIS — E782 Mixed hyperlipidemia: Secondary | ICD-10-CM | POA: Diagnosis not present

## 2024-08-20 DIAGNOSIS — G894 Chronic pain syndrome: Secondary | ICD-10-CM

## 2024-08-20 DIAGNOSIS — Z6838 Body mass index (BMI) 38.0-38.9, adult: Secondary | ICD-10-CM

## 2024-08-20 DIAGNOSIS — Z7984 Long term (current) use of oral hypoglycemic drugs: Secondary | ICD-10-CM | POA: Diagnosis not present

## 2024-08-20 DIAGNOSIS — I5032 Chronic diastolic (congestive) heart failure: Secondary | ICD-10-CM | POA: Diagnosis not present

## 2024-08-20 DIAGNOSIS — N184 Chronic kidney disease, stage 4 (severe): Secondary | ICD-10-CM | POA: Diagnosis not present

## 2024-08-20 DIAGNOSIS — N907 Vulvar cyst: Secondary | ICD-10-CM | POA: Diagnosis not present

## 2024-08-20 DIAGNOSIS — I10 Essential (primary) hypertension: Secondary | ICD-10-CM

## 2024-08-20 DIAGNOSIS — E1122 Type 2 diabetes mellitus with diabetic chronic kidney disease: Secondary | ICD-10-CM

## 2024-08-20 DIAGNOSIS — E66812 Obesity, class 2: Secondary | ICD-10-CM

## 2024-08-20 DIAGNOSIS — I495 Sick sinus syndrome: Secondary | ICD-10-CM | POA: Diagnosis not present

## 2024-08-20 LAB — BASIC METABOLIC PANEL WITH GFR
BUN: 33 mg/dL — ABNORMAL HIGH (ref 6–23)
CO2: 33 meq/L — ABNORMAL HIGH (ref 19–32)
Calcium: 9 mg/dL (ref 8.4–10.5)
Chloride: 101 meq/L (ref 96–112)
Creatinine, Ser: 2.27 mg/dL — ABNORMAL HIGH (ref 0.40–1.20)
GFR: 19.56 mL/min — ABNORMAL LOW
Glucose, Bld: 96 mg/dL (ref 70–99)
Potassium: 4.6 meq/L (ref 3.5–5.1)
Sodium: 139 meq/L (ref 135–145)

## 2024-08-20 LAB — TIQ- AMBIGUOUS ORDER

## 2024-08-20 LAB — HEMOGLOBIN A1C: Hgb A1c MFr Bld: 6.2 % (ref 4.6–6.5)

## 2024-08-20 NOTE — Assessment & Plan Note (Signed)
 Stable with pacemaker in place.

## 2024-08-20 NOTE — Assessment & Plan Note (Addendum)
 A1c has been at goal.  Continue empagliflozin  10 mg daily.

## 2024-08-20 NOTE — Progress Notes (Signed)
 " [facility] [address] [place] [address] Dept: [phone] Dept Fax: [phone]  Chronic Care Office Visit  Subjective:    Patient ID: Cathy Richardson, female    DOB: 01-23-1941, 84 y.o..   MRN: [id number]  Chief Complaint  Patient presents with   Follow-up    F/u meds.  C/o having bumps in the vaginal area x 2 months.   History of Present Illness:  Patient is in today for reassessment of chronic medical conditions.   Ms. Hazelett has a history of hyperlipidemia. She is managed on atorvastatin  20 mg daily.   Ms. Lyvers has a history of hypertension. She is managed on lisinopril 10 mg daily.   Ms. Ringley has a history of Type 2 diabetes. She is managed on empagliflozin  10 mg daily (metformin  stopped due to diarrhea).   Ms. Sallie has a history of a sick sinus syndrome. She has a pacemaker. Recent cardiology appointment indicates her heart is stable.   Ms. Zuk has HFpEF. She is managed on metoprolol  tartrate 50 mg bid, lisinopril 10 mg daily, and empagliflozin  10 mg daily.  She has furosemide  20 mg which she takes intermittently when her weight goes up 4-5 lbs over a week. She has a potassium supplement and is not sur eif she should continue taking this.  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 She is followed by nephrology. She is on an ACE-I and a SGLT2i.  Ms. Hill is under the care of Dr. Leopoldo ([facility]) for chronic pain. She is managed on a fentanyl  patch and pregabalin . Dr. Leopoldo has requested we obtain a serum drug screen on his behalf.  Ms. Cotham notes some bumps that have been present on her left labia for several weeks. These feel irritated when she uses toilet paper after urinating. She had a cyst some years ago that needed to be lanced.   Past Medical History: Patient Active Problem List   Diagnosis Date Noted   Acute nonintractable headache 05/08/2024   Heart failure with preserved ejection fraction (HCC)  02/20/2024   Neuropathy 02/20/2024   Allergic rhinitis 02/20/2024   Chronic pain of right knee 01/17/2024   Hammertoe 11/21/2023   Hallux valgus 11/21/2023   Intertrigo 11/21/2023   Chronic kidney disease, stage 4 (severe) (HCC) 11/21/2023   Chronic nausea 06/03/2023   DISH (diffuse idiopathic skeletal hyperostosis) 01/08/2023   SSS (sick sinus syndrome) (HCC) 10/01/2022   Chronic right shoulder pain 08/21/2021   OSA on CPAP 05/29/2021   Solitary kidney, acquired 07/18/2020   Chronic fatigue 11/26/2019   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/2019   Mixed hyperlipidemia 09/05/2018   RLS (restless legs syndrome) 09/05/2018   Class 2 severe obesity due to excess calories with serious comorbidity and body mass index (BMI) of 38.0 to 38.9 in adult 09/05/2018   Type 2 diabetes mellitus with diabetic chronic kidney disease (HCC) 09/05/2018   Chronic bilateral low back pain without sciatica 05/26/2018   History of colon cancer, stage I 12/13/2015   GERD (gastroesophageal reflux disease) 11/12/2015   Vitamin D  deficiency 11/12/2015   Chronic pain syndrome 11/12/2015   Asthma 11/12/2015   Leg edema 01/05/2015   Essential hypertension 06/17/2013   Personal history of renal cancer 06/17/2013   OA (osteoarthritis) 06/17/2013   Mixed incontinence 06/17/2013   Lumbar spondylosis 06/17/2013   Age-related osteoporosis without current pathological fracture 06/17/2013   Past Surgical History:  Procedure Laterality Date   bowel obstruction surgery  x 2    Carpule Tunnel Release Bilateral    CHOLECYSTECTOMY     KNEE ARTHROCENTESIS     left knee   NEPHRECTOMY     PACEMAKER IMPLANT     Removal of Skin Cancer  N/A    Forehead   SKIN SPLIT GRAFT N/A 01/05/2015   Procedure: MANDIBULAR LINGUAL SPLIT THICKNESS SKIN GRAFT VESTIVULOPLASTY;  Surgeon: Elsie CHRISTELLA Daring, DDS;  Location: [facility];  Service: Oral Surgery;  Laterality: N/A;   TOTAL KNEE ARTHROPLASTY Left    left knee replacement     Family History  Problem Relation Age of Onset   Stroke Mother    Diabetes Mother    Hypertension Mother    Stroke Father    Hypertension Father    Diabetes Daughter    Cancer Daughter        Cervical   Cancer Son        Colon   Cancer Son        Colon   Outpatient Medications Prior to Visit  Medication Sig Dispense Refill   albuterol  (PROVENTIL ) (2.5 MG/3ML) 0.083% nebulizer solution Take 3 mLs (2.5 mg total) by nebulization every 6 (six) hours as needed for wheezing or shortness of breath. 75 mL 12   atorvastatin  (LIPITOR) 20 MG tablet Take 20 mg by mouth at bedtime.     budesonide -formoterol  (SYMBICORT ) 160-4.5 MCG/ACT inhaler Inhale 2 puffs into the lungs 2 (two) times daily. 1 each 3   calcium  carbonate (TUMS - DOSED IN MG ELEMENTAL CALCIUM ) 500 MG chewable tablet Chew 2 tablets by mouth every morning.     Cholecalciferol  50 MCG (2000 UT) TABS Take 2,000 Units by mouth daily.     cyclobenzaprine  (FLEXERIL ) 10 MG tablet Take 10 mg by mouth daily as needed for muscle spasms.     doxycycline  (VIBRA -TABS) 100 MG tablet Take 1 tablet (100 mg total) by mouth 2 (two) times daily. (Patient not taking: Reported on 08/18/2024) 14 tablet 0   empagliflozin  (JARDIANCE ) 10 MG TABS tablet TAKE 1 TABLET BY MOUTH ONCE DAILY BEFORE BREAKFAST 90 tablet 3   escitalopram  (LEXAPRO ) 20 MG tablet Take 20 mg by mouth daily.     fentaNYL  (DURAGESIC ) 25 MCG/HR Place 1 patch onto the skin every 3 (three) days.     fluticasone  (FLONASE ) 50 MCG/ACT nasal spray Place 2 sprays into both nostrils daily.      furosemide  (LASIX ) 20 MG tablet Take 1 tablet (20 mg total) by mouth daily.     HYDROcodone-acetaminophen  (NORCO/VICODIN) 5-325 MG tablet Take 1 tablet by mouth every 12 (twelve) hours as needed.     latanoprost (XALATAN) 0.005 % ophthalmic solution 1 drop 4 (four) times daily.     lisinopril (ZESTRIL) 10 MG tablet Take 10 tablets by mouth daily.     metoprolol  tartrate (LOPRESSOR ) 50 MG tablet Take 1  tablet (50 mg total) by mouth 2 (two) times daily. 180 tablet 3   Multiple Vitamin (MULTIVITAMIN) tablet Take 1 tablet by mouth daily. (Patient not taking: Reported on 08/18/2024)     Omega-3 Fatty Acids (FISH OIL) 1000 MG CAPS Take 1,000 mg by mouth daily. (Patient not taking: Reported on 08/18/2024)     pantoprazole  (PROTONIX ) 40 MG tablet Take 40 mg by mouth.     potassium chloride  (KLOR-CON ) 10 MEQ tablet Take 1 tablet (10 mEq total) by mouth daily. (Patient not taking: Reported on 08/18/2024) 90 tablet 0   pramipexole  (MIRAPEX ) 0.125 MG tablet Take 0.125-0.25 mg by mouth  See admin instructions. Taking one or two tablets at bedtime for restless leg     pregabalin  (LYRICA ) 25 MG capsule Take 1 capsule (25 mg total) by mouth in the morning and at bedtime. 180 capsule 3   silver  sulfADIAZINE  (SILVADENE ) 1 % cream APPLY  CREAM EXTERNALLY TWICE DAILY AS NEEDED 50 g 0   traMADol (ULTRAM) 50 MG tablet Take 1 tablet by mouth every 8 (eight) hours as needed.     No facility-administered medications prior to visit.   Allergies[1]   Objective:   Today's Vitals   08/20/24 0805  BP: 114/60  Pulse: 63  Temp: 97.7 F (36.5 C)  TempSrc: Temporal  SpO2: 93%  Weight: 204 lb 3.2 oz (92.6 kg)  Height: 5' 4 (1.626 m)   Body mass index is 35.05 kg/m.   General: Well developed, well nourished. No acute distress. GU: There are three small vesicular lesions with a whitish material inside on the inner aspect of the left labia majora. There is   no surrounding erythema. Psych: Alert and oriented. Normal mood and affect.  Health Maintenance Due  Topic Date Due   Zoster Vaccines- Shingrix (1 of 2) Never done   DTaP/Tdap/Td (2 - Td or Tdap) 02/04/2021   Medicare Annual Wellness (AWV)  06/07/2021   COVID-19 Vaccine (6 - 2025-26 season) 03/30/2024     Assessment & Plan:   Problem List Items Addressed This Visit       Cardiovascular and Mediastinum   Essential hypertension   Blood pressure is in  adequate control. Continue lisinopril 10 mg daily and metoprolol  tartrate 50 mg bid.      Heart failure with preserved ejection fraction (HCC) - Primary   Compensated. Continue metoprolol  tartrate 50 mg bid, lisinopril 10 mg daily, and empagliflozin  10 mg daily. Continue furosemide  20 mg as needed for edema. I recommend she take a potassium supplement any days she takes furosemide . I will recheck her potassium today.      SSS (sick sinus syndrome) (HCC)   Stable with pacemaker in place.        Endocrine   Type 2 diabetes mellitus with diabetic chronic kidney disease (HCC)   A1c has been at goal.  Continue empagliflozin  10 mg daily.      Relevant Orders   Hemoglobin A1c   Basic metabolic panel with GFR     Genitourinary   Chronic kidney disease, stage 4 (severe) (HCC)   Ms. Hillary is followed by nephrology. Continue focus on blood pressure and glucose control, adequate hydration, and avoidance of nephrotoxic medications. Continue ACE-I and SGLT2i.        Other   Chronic pain syndrome   On chronic opioid therapy as directed by Dr. Leopoldo. I will order a drug screen as requested by Dr. Leopoldo.      Relevant Orders   Drug Screen 10 W/Conf, Serum   Class 2 severe obesity due to excess calories with serious comorbidity and body mass index (BMI) of 38.0 to 38.9 in adult   Weight is down ~ 20 lbs. from her maximum weight. She should continue lifestyle efforts to gradually reduce this further.      Mixed hyperlipidemia   LDL cholesterol is at goal. Continue atorvastatin  20 mg daily.      Other Visit Diagnoses       Labial cyst       Appears to be several small epidermoid cyst. I recommendexpectant management at this point.     Long term  current use of oral hypoglycemic drug           Return in about 3 months (around 11/18/2024) for Reassessment.   Garnette CHRISTELLA Simpler, MD  I,Emily Lagle,acting as a scribe for Garnette CHRISTELLA Simpler, MD.,have documented all relevant documentation on the  behalf of Garnette CHRISTELLA Simpler, MD.  I, Garnette CHRISTELLA Simpler, MD, have reviewed all documentation for this visit. The documentation on 08/20/2024 for the exam, diagnosis, procedures, and orders are all accurate and complete.      [1]  Allergies Allergen Reactions   Gabapentin Shortness Of Breath   Fluoxetine     Other reaction(s): Other (See Comments) Severe headache   Iodine-131 Itching and Swelling    And swollen eyelids    Metformin  And Related Diarrhea   Naproxen Itching   Reglan  [Metoclopramide ] Other (See Comments)    dizzy   Tizanidine Other (See Comments)    Confusion    Ivp Dye [Iodinated Contrast Media] Rash   "

## 2024-08-20 NOTE — Assessment & Plan Note (Signed)
 On chronic opioid therapy as directed by Dr. Leopoldo. I will order a drug screen as requested by Dr. Leopoldo.

## 2024-08-20 NOTE — Assessment & Plan Note (Signed)
 LDL cholesterol is at goal. Continue atorvastatin  20 mg daily.

## 2024-08-20 NOTE — Assessment & Plan Note (Addendum)
 Blood pressure is in adequate control. Continue lisinopril 10 mg daily and metoprolol  tartrate 50 mg bid.

## 2024-08-20 NOTE — Assessment & Plan Note (Addendum)
 Cathy Richardson is followed by nephrology. Continue focus on blood pressure and glucose control, adequate hydration, and avoidance of nephrotoxic medications. Continue ACE-I and SGLT2i.

## 2024-08-20 NOTE — Assessment & Plan Note (Addendum)
 Compensated. Continue metoprolol  tartrate 50 mg bid, lisinopril 10 mg daily, and empagliflozin  10 mg daily. Continue furosemide  20 mg as needed for edema. I recommend she take a potassium supplement any days she takes furosemide . I will recheck her potassium today.

## 2024-08-20 NOTE — Assessment & Plan Note (Addendum)
 Weight is down ~ 20 lbs. from her maximum weight. She should continue lifestyle efforts to gradually reduce this further.

## 2024-08-22 NOTE — Progress Notes (Signed)
 " Cardiology Office Note   Date: 08/18/24  ID:  Cathy Richardson, Cathy Richardson [birth year], MRN [id number] PCP: Thedora Garnette HERO, MD  [facility] Providers Cardiologist:  Newman JINNY Lawrence, MD Electrophysiologist:  Donnice DELENA Primus, MD    History of Present Illness Cathy Richardson is a 84 y.o. female with SSS s/p LEFT MDT DC PPM, paroxysmal AT, OSA, HTN, HLD who presents to establish care.   Discussed the use of AI scribe software for clinical note transcription with the patient, who gave verbal consent to proceed. History of Present Illness Cathy Richardson is an 84 year old female who presents for a follow-up visit regarding her pacemaker. She was referred by her primary care doctor, who has since retired, to pacemaker specialists after a temporary device was used to assess her condition.  She initially had a pacemaker implanted, which malfunctioned shortly after placement, necessitating its replacement. She recalls an unusual sensation with the device on a Sunday morning before a planned beach trip, leading to an emergency room visit. Although no immediate action was taken, the issue resolved, and the device was later replaced at [facility].  Prior to the initial pacemaker implantation, she did not experience dizziness or lightheadedness, but she did feel tired and sleepy all the time, which did not improve post-implantation.  ROS: none   Studies Reviewed  ECG review 12/30/23: APVS 71, PR 324, QRS 128, QT/c 406/441 12/06/23: NSR 79, PR 206, QRS 169, QT/c 442/507  TTE Result date: 01/15/24  1. Left ventricular ejection fraction, by estimation, is 60 to 65%. Left  ventricular ejection fraction by 3D volume is 58 %. The left ventricle has  normal function. The left ventricle has no regional wall motion  abnormalities. Left ventricular diastolic   parameters are consistent with Grade I diastolic dysfunction (impaired  relaxation). The average left ventricular global  longitudinal strain is  -25.3 %. The global longitudinal strain is normal.   2. Right ventricular systolic function is normal. The right ventricular  size is normal.   3. The mitral valve is normal in structure. Trivial mitral valve  regurgitation. No evidence of mitral stenosis.   4. The aortic valve is tricuspid. There is mild calcification of the  aortic valve. Aortic valve regurgitation is not visualized. Aortic valve  sclerosis/calcification is present, without any evidence of aortic  stenosis.   Physical Exam VS:  BP (!) 112/58 (BP Location: Left Arm, Patient Position: Sitting, Cuff Size: Large)   Pulse 70   Ht 5' 4 (1.626 m)   Wt 198 lb (89.8 kg)   SpO2 98%   BMI 33.99 kg/m   Wt Readings from Last 3 Encounters:  08/20/24 204 lb 3.2 oz (92.6 kg)  08/18/24 198 lb (89.8 kg)  07/20/24 199 lb 9.6 oz (90.5 kg)    GEN: Well nourished, well developed in no acute distress NECK: No JVD; No carotid bruits CARDIAC: RRR, no murmurs, rubs, gallops RESPIRATORY:  Clear to auscultation without rales, wheezing or rhonchi  ABDOMEN: Soft, non-tender, non-distended EXTREMITIES:  No edema; No deformity  SKIN: LEFT infraclavicular incision well-healed   ASSESSMENT AND PLAN Cathy Richardson is a 84 y.o. female with SSS s/p LEFT MDT DC PPM, paroxysmal AT, OSA, HTN, HLD who presents to establish care.  Assessment & Plan Sick sinus syndrome with cardiac pacemaker Pacemaker functioning well. Persistent fatigue noted. No pacemaker site issues. - Continue remote pacemaker checks every three months. - Scheduled annual in-person follow-up to review pacemaker  function and overall health. - Advised to report any signs of infection or issues at the pacemaker site.  Dispo: RTC 1 yr  Signed, Donnice DELENA Primus, MD  "

## 2024-10-13 ENCOUNTER — Ambulatory Visit: Admitting: Cardiology

## 2024-11-13 ENCOUNTER — Ambulatory Visit: Admitting: Physician Assistant

## 2024-11-17 ENCOUNTER — Ambulatory Visit: Admitting: Family Medicine

## 2024-11-17 ENCOUNTER — Ambulatory Visit

## 2025-02-16 ENCOUNTER — Ambulatory Visit

## 2025-05-18 ENCOUNTER — Ambulatory Visit

## 2025-08-17 ENCOUNTER — Ambulatory Visit

## 2025-11-16 ENCOUNTER — Ambulatory Visit
